# Patient Record
Sex: Female | Born: 1945
Health system: Southern US, Community
[De-identification: ages and names within clinical notes are randomized; demographics above are authoritative.]

## PROBLEM LIST (undated history)

## (undated) DIAGNOSIS — I10 Essential (primary) hypertension: Secondary | ICD-10-CM

## (undated) DIAGNOSIS — G629 Polyneuropathy, unspecified: Secondary | ICD-10-CM

## (undated) DIAGNOSIS — M1990 Unspecified osteoarthritis, unspecified site: Secondary | ICD-10-CM

## (undated) DIAGNOSIS — E785 Hyperlipidemia, unspecified: Secondary | ICD-10-CM

## (undated) HISTORY — DX: Polyneuropathy, unspecified: G62.9

## (undated) HISTORY — PX: ABDOMINAL HYSTERECTOMY: SHX81

## (undated) HISTORY — DX: Unspecified osteoarthritis, unspecified site: M19.90

## (undated) HISTORY — DX: Hyperlipidemia, unspecified: E78.5

---

## 1996-09-08 HISTORY — PX: KNEE ARTHROSCOPY: SUR90

## 2001-07-27 ENCOUNTER — Ambulatory Visit (HOSPITAL_COMMUNITY): Admission: RE | Admit: 2001-07-27 | Discharge: 2001-07-27 | Payer: Self-pay | Admitting: Family Medicine

## 2001-07-27 ENCOUNTER — Encounter: Payer: Self-pay | Admitting: Family Medicine

## 2001-10-21 ENCOUNTER — Ambulatory Visit (HOSPITAL_COMMUNITY): Admission: RE | Admit: 2001-10-21 | Discharge: 2001-10-21 | Payer: Self-pay | Admitting: Orthopaedic Surgery

## 2001-10-21 ENCOUNTER — Encounter: Payer: Self-pay | Admitting: Orthopaedic Surgery

## 2002-04-09 ENCOUNTER — Emergency Department (HOSPITAL_COMMUNITY): Admission: EM | Admit: 2002-04-09 | Discharge: 2002-04-09 | Payer: Self-pay | Admitting: *Deleted

## 2004-01-16 ENCOUNTER — Ambulatory Visit (HOSPITAL_COMMUNITY): Admission: RE | Admit: 2004-01-16 | Discharge: 2004-01-16 | Payer: Self-pay | Admitting: Family Medicine

## 2004-02-19 ENCOUNTER — Ambulatory Visit (HOSPITAL_COMMUNITY): Admission: RE | Admit: 2004-02-19 | Discharge: 2004-02-19 | Payer: Self-pay | Admitting: Internal Medicine

## 2004-06-20 ENCOUNTER — Ambulatory Visit (HOSPITAL_COMMUNITY): Admission: RE | Admit: 2004-06-20 | Discharge: 2004-06-20 | Payer: Self-pay | Admitting: Family Medicine

## 2005-02-23 ENCOUNTER — Inpatient Hospital Stay (HOSPITAL_COMMUNITY): Admission: EM | Admit: 2005-02-23 | Discharge: 2005-02-28 | Payer: Self-pay | Admitting: *Deleted

## 2011-05-16 ENCOUNTER — Other Ambulatory Visit (HOSPITAL_COMMUNITY): Payer: Self-pay | Admitting: Family Medicine

## 2011-05-16 DIAGNOSIS — Z139 Encounter for screening, unspecified: Secondary | ICD-10-CM

## 2011-05-27 ENCOUNTER — Ambulatory Visit (HOSPITAL_COMMUNITY): Payer: Self-pay

## 2011-06-05 ENCOUNTER — Ambulatory Visit (HOSPITAL_COMMUNITY)
Admission: RE | Admit: 2011-06-05 | Discharge: 2011-06-05 | Disposition: A | Discharge status: Home / Self Care | Payer: Medicare Other | Source: Ambulatory Visit | Attending: Family Medicine | Admitting: Family Medicine

## 2011-06-05 DIAGNOSIS — Z1231 Encounter for screening mammogram for malignant neoplasm of breast: Secondary | ICD-10-CM | POA: Insufficient documentation

## 2011-06-05 DIAGNOSIS — Z139 Encounter for screening, unspecified: Secondary | ICD-10-CM

## 2011-08-02 ENCOUNTER — Emergency Department (HOSPITAL_COMMUNITY)
Admission: EM | Admit: 2011-08-02 | Discharge: 2011-08-02 | Disposition: A | Discharge status: Home / Self Care | Payer: Medicare Other | Attending: Emergency Medicine | Admitting: Emergency Medicine

## 2011-08-02 ENCOUNTER — Encounter: Payer: Self-pay | Admitting: *Deleted

## 2011-08-02 DIAGNOSIS — E119 Type 2 diabetes mellitus without complications: Secondary | ICD-10-CM | POA: Insufficient documentation

## 2011-08-02 DIAGNOSIS — X58XXXA Exposure to other specified factors, initial encounter: Secondary | ICD-10-CM | POA: Insufficient documentation

## 2011-08-02 DIAGNOSIS — I10 Essential (primary) hypertension: Secondary | ICD-10-CM | POA: Insufficient documentation

## 2011-08-02 DIAGNOSIS — T783XXA Angioneurotic edema, initial encounter: Secondary | ICD-10-CM | POA: Insufficient documentation

## 2011-08-02 DIAGNOSIS — Z87891 Personal history of nicotine dependence: Secondary | ICD-10-CM | POA: Insufficient documentation

## 2011-08-02 HISTORY — DX: Essential (primary) hypertension: I10

## 2011-08-02 MED ORDER — DILTIAZEM HCL ER COATED BEADS 120 MG PO CP24: 120.0000 mg | ORAL_CAPSULE | Freq: Every day | ORAL | Status: DC

## 2011-08-02 MED ORDER — DIPHENHYDRAMINE HCL 50 MG/ML IJ SOLN
25.0000 mg | Freq: Once | INTRAMUSCULAR | Status: DC
  Filled 2011-08-02: qty 1

## 2011-08-02 MED ORDER — PREDNISONE 20 MG PO TABS
60.0000 mg | ORAL_TABLET | Freq: Once | ORAL | Status: AC
  Administered 2011-08-02: 60 mg via ORAL
  Filled 2011-08-02: qty 3

## 2011-08-02 MED ORDER — DIPHENHYDRAMINE HCL 25 MG PO CAPS
25.0000 mg | ORAL_CAPSULE | Freq: Once | ORAL | Status: AC
  Administered 2011-08-02: 25 mg via ORAL
  Filled 2011-08-02: qty 1

## 2011-08-02 MED ORDER — FAMOTIDINE 20 MG PO TABS
20.0000 mg | ORAL_TABLET | Freq: Once | ORAL | Status: AC
  Administered 2011-08-02: 20 mg via ORAL
  Filled 2011-08-02: qty 1

## 2011-08-02 NOTE — ED Provider Notes (Signed)
History     CSN: [id number] Arrival date & time: 08/02/2011  7:16 PM   First MD Initiated Contact with Patient 08/02/11 1922      Chief Complaint  Patient presents with  . Oral Swelling    (Consider location/radiation/quality/duration/timing/severity/associated sxs/prior treatment) HPI Comments: Sue Wood is a 65 y.o. Female with a h/o hypertension and diabetes who presents to the Emergency Department complaining of lip selling that began this morning after eating and bagel and having green tea. She states that within 15 minutes of eating the bagel she felt her left upper lip begin to swell. The swelling mae it feel tingling. The swelling subsided on its own over a couple of hours and then she developed marked swelling of her lower lip, which is still present. She states the swelling makes her lip feel tight and painful. She denies itching to her throat, tongue swelling, difficulty taking or swallowing. She states that both of her sisters have had similar symptoms in the past and were taken off their antihypertensive medicines. The patient is on lisinopril. PCP is [employer].   Past Medical History  Diagnosis Date  . Hypertension   . Diabetes mellitus     History reviewed. No pertinent past surgical history.  History reviewed. No pertinent family history.  History  Substance Use Topics  . Smoking status: Former Games developer  . Smokeless tobacco: Not on file  . Alcohol Use: No    OB History    Grav Para Term Preterm Abortions TAB SAB Ect Mult Living                  Review of Systems 10 Systems reviewed and are negative for acute change except as noted in the HPI. Allergies  Review of patient's allergies indicates no known allergies.  Home Medications  No current outpatient prescriptions on file.  BP 145/69  Pulse 86  Temp(Src) 98.7 F (37.1 C) (Oral)  Resp 20  Ht 5\' 6"  (1.676 m)  Wt 218 lb (98.884 kg)  BMI 35.19 kg/m2  SpO2 100%  Physical Exam   Nursing note and vitals reviewed. Constitutional: She is oriented to person, place, and time. She appears well-developed and well-nourished. No distress.  HENT:  Head: Normocephalic.       Marked swelling to lower lip, no tongue swelling, uvula midline, no airway compromise, oropharynx clear and moist.  Neck: Normal range of motion.       No stridor  Cardiovascular: Normal rate, normal heart sounds and intact distal pulses.   Pulmonary/Chest: Effort normal and breath sounds normal.  Abdominal: Soft. Bowel sounds are normal.  Musculoskeletal: Normal range of motion.  Neurological: She is alert and oriented to person, place, and time. She has normal reflexes.    ED Course  Procedures (including critical care time)  Labs Reviewed - No data to display No results found.   No diagnosis found.    MDM  Patient with angioedema to lower lip that began today. Has been on lisinopril since 2006. Family h/o angioedema in two siblings who were on ACE inhibitors. Given prednisone,pepcid, bendadryl. Stopped lisinopril. Began an alternate antihypertensive. She will follow up with her PCP. Pt stable in ED with no significant deterioration in condition.The patient appears reasonably screened and/or stabilized for discharge and I doubt any other medical condition or other [facility] requiring further screening, evaluation, or treatment in the ED at this time prior to discharge.  MDM Reviewed: nursing note and vitals  Nicoletta Dress. Colon Branch, MD 08/03/11 [phone]

## 2011-08-02 NOTE — ED Notes (Signed)
Noted upper lip swelling yesterday after eating a bagel and green tea, upper lip with decreased swelling but bottom lip very swollen now

## 2011-08-02 NOTE — ED Notes (Signed)
Attempted to start IV x 4 by 2 RNs unsuccessful.

## 2011-08-02 NOTE — ED Notes (Signed)
Breathing WNL; Lips and gums swollen started yesterday at 1600

## 2011-08-02 NOTE — ED Notes (Signed)
Pt ambulatory and stable at discharge; no resp. Distress; no c/o's

## 2011-08-19 ENCOUNTER — Other Ambulatory Visit (HOSPITAL_COMMUNITY): Payer: Self-pay | Admitting: Family Medicine

## 2011-08-19 DIAGNOSIS — M25519 Pain in unspecified shoulder: Secondary | ICD-10-CM

## 2011-09-04 ENCOUNTER — Ambulatory Visit (HOSPITAL_COMMUNITY)
Admission: RE | Admit: 2011-09-04 | Discharge: 2011-09-04 | Disposition: A | Discharge status: Home / Self Care | Payer: Medicare Other | Source: Ambulatory Visit | Attending: Family Medicine | Admitting: Family Medicine

## 2011-09-04 DIAGNOSIS — M67919 Unspecified disorder of synovium and tendon, unspecified shoulder: Secondary | ICD-10-CM | POA: Insufficient documentation

## 2011-09-04 DIAGNOSIS — M25519 Pain in unspecified shoulder: Secondary | ICD-10-CM | POA: Insufficient documentation

## 2011-09-04 DIAGNOSIS — M719 Bursopathy, unspecified: Secondary | ICD-10-CM | POA: Insufficient documentation

## 2011-09-04 DIAGNOSIS — M19019 Primary osteoarthritis, unspecified shoulder: Secondary | ICD-10-CM | POA: Insufficient documentation

## 2011-12-31 ENCOUNTER — Encounter: Payer: Self-pay | Admitting: Family Medicine

## 2011-12-31 ENCOUNTER — Ambulatory Visit (INDEPENDENT_AMBULATORY_CARE_PROVIDER_SITE_OTHER): Payer: Medicare Other | Admitting: Family Medicine

## 2011-12-31 VITALS — BP 126/78 | HR 87 | Resp 18 | Ht 68.0 in | Wt 217.0 lb

## 2011-12-31 DIAGNOSIS — M21612 Bunion of left foot: Secondary | ICD-10-CM | POA: Insufficient documentation

## 2011-12-31 DIAGNOSIS — E669 Obesity, unspecified: Secondary | ICD-10-CM

## 2011-12-31 DIAGNOSIS — M21619 Bunion of unspecified foot: Secondary | ICD-10-CM

## 2011-12-31 DIAGNOSIS — E1142 Type 2 diabetes mellitus with diabetic polyneuropathy: Secondary | ICD-10-CM

## 2011-12-31 DIAGNOSIS — I10 Essential (primary) hypertension: Secondary | ICD-10-CM

## 2011-12-31 DIAGNOSIS — E785 Hyperlipidemia, unspecified: Secondary | ICD-10-CM

## 2011-12-31 DIAGNOSIS — M21611 Bunion of right foot: Secondary | ICD-10-CM | POA: Insufficient documentation

## 2011-12-31 DIAGNOSIS — E1140 Type 2 diabetes mellitus with diabetic neuropathy, unspecified: Secondary | ICD-10-CM

## 2011-12-31 DIAGNOSIS — IMO0001 Reserved for inherently not codable concepts without codable children: Secondary | ICD-10-CM

## 2011-12-31 DIAGNOSIS — E1149 Type 2 diabetes mellitus with other diabetic neurological complication: Secondary | ICD-10-CM

## 2011-12-31 DIAGNOSIS — E1165 Type 2 diabetes mellitus with hyperglycemia: Secondary | ICD-10-CM

## 2011-12-31 DIAGNOSIS — IMO0002 Reserved for concepts with insufficient information to code with codable children: Secondary | ICD-10-CM

## 2011-12-31 LAB — HEMOGLOBIN A1C
Hgb A1c MFr Bld: 10.7 % — ABNORMAL HIGH (ref <5.7)
Mean Plasma Glucose: 260 mg/dL — ABNORMAL HIGH (ref <117)

## 2011-12-31 LAB — COMPREHENSIVE METABOLIC PANEL
ALT: 18 U/L (ref 0–35)
Albumin: 4.6 g/dL (ref 3.5–5.2)
CO2: 27 mEq/L (ref 19–32)
Chloride: 102 mEq/L (ref 96–112)
Total Protein: 7.9 g/dL (ref 6.0–8.3)

## 2011-12-31 LAB — LIPID PANEL
HDL: 66 mg/dL (ref >39)
Total CHOL/HDL Ratio: 2.7 Ratio
Triglycerides: 72 mg/dL (ref <150)

## 2011-12-31 LAB — CBC
Hemoglobin: 12 g/dL (ref 12.0–15.0)
MCH: 29.1 pg (ref 26.0–34.0)
MCHC: 32.3 g/dL (ref 30.0–36.0)
RDW: 14 % (ref 11.5–15.5)
WBC: 4.8 10*3/uL (ref 4.0–10.5)

## 2011-12-31 MED ORDER — AMITRIPTYLINE HCL 50 MG PO TABS: 50.0000 mg | ORAL_TABLET | Freq: Every day | ORAL | Status: DC

## 2011-12-31 MED ORDER — INSULIN GLARGINE 100 UNIT/ML ~~LOC~~ SOLN: 38.0000 [IU] | Freq: Every day | SUBCUTANEOUS | Status: DC

## 2011-12-31 MED ORDER — INSULIN ASPART 100 UNIT/ML ~~LOC~~ SOLN: SUBCUTANEOUS | Status: DC

## 2011-12-31 MED ORDER — EZETIMIBE-SIMVASTATIN 10-40 MG PO TABS: 1.0000 | ORAL_TABLET | Freq: Every day | ORAL | Status: DC

## 2011-12-31 MED ORDER — DILTIAZEM HCL 90 MG PO TABS: 90.0000 mg | ORAL_TABLET | Freq: Four times a day (QID) | ORAL | Status: DC

## 2011-12-31 NOTE — Assessment & Plan Note (Signed)
At goal, pt diabetic but does not tolerate ACEI

## 2011-12-31 NOTE — Progress Notes (Signed)
  Subjective:    Patient ID: Sue Wood, female    DOB: [birth year], 66 y.o.   MRN: [id number]  HPI Pt here to establish care, previous PCP [facility] practice Ortho-Dr. Hilda Lias Medications and history reviewed DM- fasting blood sugars have been elevated 200-300 since she had a steroid shot in her shoulder for bursitis, due for labs, taking Lantus 38units and SSI, +peripheral neuropathy, no hypoglycemia Joint pain- followed by ortho, s/p arthroscopy for knee. Colonoscopy 2003 Mammogram UTD    Review of Systems  GEN- denies fatigue, fever, weight loss,weakness, recent illness HEENT- denies eye drainage, change in vision, nasal discharge, CVS- denies chest pain, palpitations RESP- denies SOB, cough, wheeze ABD- denies N/V, change in stools, abd pain GU- denies dysuria, hematuria, dribbling, incontinence MSK- + joint pain, muscle aches, injury Neuro- denies headache, dizziness, syncope, seizure activity       Objective:   Physical Exam GEN- NAD, alert and oriented x3 HEENT- PERRL, EOMI, non injected sclera, pink conjunctiva, MMM, oropharynx clear, dentures, blind left eye, Neck- Supple, no thryomegaly, no bruit CVS- RRR, no murmur RESP-CTAB ABD-NABS,soft, NT,ND EXT- trace pedal edema Pulses- Radial, DP- 2+        Assessment & Plan:

## 2011-12-31 NOTE — Assessment & Plan Note (Signed)
Continue TCA 

## 2011-12-31 NOTE — Assessment & Plan Note (Signed)
Continue Vytorin, check labs

## 2011-12-31 NOTE — Assessment & Plan Note (Signed)
Will hold on podiatry at this time.

## 2011-12-31 NOTE — Assessment & Plan Note (Signed)
Check A1C and titrate insulin

## 2011-12-31 NOTE — Patient Instructions (Signed)
I have refilled your medications We will call and tell you what to change your insulin to. Continue current meds Nice to meet you! F/U 4 weeks

## 2012-01-12 ENCOUNTER — Telehealth: Payer: Self-pay | Admitting: Family Medicine

## 2012-01-12 NOTE — Telephone Encounter (Signed)
BOTH insulins that she is on have already been refilled

## 2012-01-15 ENCOUNTER — Telehealth: Payer: Self-pay | Admitting: Family Medicine

## 2012-01-16 ENCOUNTER — Other Ambulatory Visit: Payer: Self-pay

## 2012-01-16 MED ORDER — INSULIN GLARGINE 100 UNIT/ML ~~LOC~~ SOLN: 38.0000 [IU] | Freq: Every day | SUBCUTANEOUS | Status: DC

## 2012-01-16 MED ORDER — INSULIN ASPART 100 UNIT/ML ~~LOC~~ SOLN: SUBCUTANEOUS | Status: DC

## 2012-01-16 NOTE — Telephone Encounter (Signed)
meds reordered

## 2012-01-28 ENCOUNTER — Ambulatory Visit (INDEPENDENT_AMBULATORY_CARE_PROVIDER_SITE_OTHER): Payer: Medicare Other | Admitting: Family Medicine

## 2012-01-28 ENCOUNTER — Encounter: Payer: Self-pay | Admitting: Family Medicine

## 2012-01-28 VITALS — BP 110/80 | HR 94 | Resp 15 | Ht 68.0 in | Wt 211.0 lb

## 2012-01-28 DIAGNOSIS — IMO0002 Reserved for concepts with insufficient information to code with codable children: Secondary | ICD-10-CM

## 2012-01-28 DIAGNOSIS — E1165 Type 2 diabetes mellitus with hyperglycemia: Secondary | ICD-10-CM

## 2012-01-28 DIAGNOSIS — I10 Essential (primary) hypertension: Secondary | ICD-10-CM

## 2012-01-28 DIAGNOSIS — IMO0001 Reserved for inherently not codable concepts without codable children: Secondary | ICD-10-CM

## 2012-01-28 MED ORDER — METFORMIN HCL 1000 MG PO TABS: 1000.0000 mg | ORAL_TABLET | Freq: Two times a day (BID) | ORAL | Status: DC

## 2012-01-28 NOTE — Patient Instructions (Signed)
For the metformin (diabetes pill), take 1/2 tablet at dinner for 1 week Then increase to 1/2 tablet (breakfast and dinner) twice a day x 1 week Then increase 1/2 tablet w/ breakfast and 1 tablet at dinner x 1 week Then 1 tablet twice a day  Keep the lantus at the same dose Increase your Novolog- to 12 units with each meal  If your blood sugars are still in the 400's in 2 weeks please call  F/U 2 months

## 2012-01-29 ENCOUNTER — Encounter: Payer: Self-pay | Admitting: Family Medicine

## 2012-01-29 NOTE — Assessment & Plan Note (Signed)
She was placed directly on insulin after a hospitilization, I think she would benefit from addition of metformin, will start titration see below She is to increase her meal time coverage to 12 units Continue lantus at 40 units

## 2012-01-29 NOTE — Progress Notes (Signed)
  Subjective:    Patient ID: Sue Wood, female    DOB: 1946-08-21, 66 y.o.   MRN: [id number]  HPI Pt here for intermin visit for DM, seen 4 weeks ago, A1C was elevated at 10.7%, last A1C 7.6% at her previous PCP. She has some difficulty getting her insulin but is now using as directed Fasting CBG at 200's. After meal up to 500.  She felt fatigued when she did not have her insulin but otherwise doing well Denies polyuria, hypoglycemia  Review of Systems    GEN- denies fatigue, fever, weight loss,weakness, recent illness HEENT- denies eye drainage, change in vision, nasal discharge, CVS- denies chest pain, palpitations RESP- denies SOB, cough, wheeze ABD- denies N/V, change in stools, abd pain       Objective:   Physical Exam GEN- NAD, alert and oriented x3 CVS- RRR, no murmur RESP-CTAB EXT- trace pedal edema Pulses- Radial, DP- 2+       Assessment & Plan:

## 2012-01-29 NOTE — Assessment & Plan Note (Signed)
Well controlled 

## 2012-06-10 ENCOUNTER — Other Ambulatory Visit: Payer: Self-pay | Admitting: Family Medicine

## 2012-07-09 ENCOUNTER — Other Ambulatory Visit: Payer: Self-pay | Admitting: Family Medicine

## 2012-08-20 ENCOUNTER — Encounter: Payer: Self-pay | Admitting: Family Medicine

## 2012-08-20 ENCOUNTER — Ambulatory Visit (INDEPENDENT_AMBULATORY_CARE_PROVIDER_SITE_OTHER): Payer: Medicare Other | Admitting: Family Medicine

## 2012-08-20 VITALS — BP 120/82 | HR 97 | Resp 16 | Ht 68.0 in | Wt 214.0 lb

## 2012-08-20 DIAGNOSIS — IMO0001 Reserved for inherently not codable concepts without codable children: Secondary | ICD-10-CM

## 2012-08-20 DIAGNOSIS — IMO0002 Reserved for concepts with insufficient information to code with codable children: Secondary | ICD-10-CM

## 2012-08-20 DIAGNOSIS — E1140 Type 2 diabetes mellitus with diabetic neuropathy, unspecified: Secondary | ICD-10-CM

## 2012-08-20 DIAGNOSIS — E119 Type 2 diabetes mellitus without complications: Secondary | ICD-10-CM

## 2012-08-20 DIAGNOSIS — E1142 Type 2 diabetes mellitus with diabetic polyneuropathy: Secondary | ICD-10-CM

## 2012-08-20 DIAGNOSIS — E1149 Type 2 diabetes mellitus with other diabetic neurological complication: Secondary | ICD-10-CM

## 2012-08-20 DIAGNOSIS — I10 Essential (primary) hypertension: Secondary | ICD-10-CM

## 2012-08-20 DIAGNOSIS — E669 Obesity, unspecified: Secondary | ICD-10-CM

## 2012-08-20 DIAGNOSIS — E785 Hyperlipidemia, unspecified: Secondary | ICD-10-CM

## 2012-08-20 DIAGNOSIS — E1165 Type 2 diabetes mellitus with hyperglycemia: Secondary | ICD-10-CM

## 2012-08-20 MED ORDER — EZETIMIBE-SIMVASTATIN 10-40 MG PO TABS: 1.0000 | ORAL_TABLET | Freq: Every day | ORAL | Status: DC

## 2012-08-20 NOTE — Patient Instructions (Addendum)
Take Lantus 45 units at bedtime Novolog 10 units with each meal Metformin 1 tablet twice a day Blood pressure pill 1/2 tablet twice a day  Get your blood drawn today [facility] referral  F/U 4 weeks bring meter and medicines

## 2012-08-21 LAB — BASIC METABOLIC PANEL
BUN: 19 mg/dL (ref 6–23)
Chloride: 100 mEq/L (ref 96–112)
Glucose, Bld: 292 mg/dL — ABNORMAL HIGH (ref 70–99)

## 2012-08-22 ENCOUNTER — Encounter: Payer: Self-pay | Admitting: Family Medicine

## 2012-08-22 NOTE — Assessment & Plan Note (Signed)
Unfortunately she has been lost to follow-up due to transportation issues, I will have [facility] work with her Lantus 45 units  Continue Novolog 10 units with each meal Insulin to be adjusted once A1C seen Advised 1 tab metformin twice a day On statin, allergy ACEI

## 2012-08-22 NOTE — Assessment & Plan Note (Signed)
On statin drug, last check April at goal

## 2012-08-22 NOTE — Progress Notes (Signed)
  Subjective:    Patient ID: Sue Wood, female    DOB: 05-05-1946, 66 y.o.   MRN: [id number]  HPI  Pt presents to f/u chronic medical problems, she has not been seen since May 2013, she states she has not been able to get a ride to office. Has been taking 1/2 tablet Metformin tiwce a day, using Lantus 45 units and novolov 10 units with some meals but not all. Describes if sugar is in 100's fasting she will not take at breakfast?? No hypoglycemia She has only been taking 1 of the  90mg  cardizem tablet daily  No flu shot Out of novolog for 1 week now  Review of Systems - per above  GEN- denies fatigue, fever, weight loss,weakness, recent illness HEENT- denies eye drainage, change in vision, nasal discharge, CVS- denies chest pain, palpitations RESP- denies SOB, cough, wheeze ABD- denies N/V, change in stools, abd pain GU- denies dysuria, hematuria, dribbling, incontinence MSK- denies joint pain, muscle aches, injury Neuro- denies headache, dizziness, syncope, seizure activity      Objective:   Physical Exam  GEN- NAD, alert and oriented x3 HEENT- PERRL, EOMI, non injected sclera, pink conjunctiva, MMM, oropharynx clear Neck- Supple,  CVS- RRR, no murmur RESP-CTAB ABD-NABS,soft,NT,ND EXT- trace pedal edema Pulses- Radial, DP- 2+          Assessment & Plan:

## 2012-08-22 NOTE — Assessment & Plan Note (Signed)
Doing well on elavil.

## 2012-08-22 NOTE — Assessment & Plan Note (Signed)
Unchanged, discusse diet avoidance of sugary snacks, drinks,

## 2012-08-22 NOTE — Assessment & Plan Note (Signed)
Her BP looks okay but she is taking short acting cardizem, we will try 1/2 tablet BID to make sure she is covered in the evening , may need to change this all together at next visit

## 2012-10-07 ENCOUNTER — Encounter: Payer: Self-pay | Admitting: Family Medicine

## 2012-10-07 ENCOUNTER — Ambulatory Visit: Payer: Medicare Other | Admitting: Family Medicine

## 2012-10-12 ENCOUNTER — Ambulatory Visit (INDEPENDENT_AMBULATORY_CARE_PROVIDER_SITE_OTHER): Payer: Medicare Other | Admitting: Family Medicine

## 2012-10-12 ENCOUNTER — Encounter: Payer: Self-pay | Admitting: Family Medicine

## 2012-10-12 VITALS — BP 128/80 | HR 78 | Resp 16 | Ht 68.0 in | Wt 217.0 lb

## 2012-10-12 DIAGNOSIS — IMO0001 Reserved for inherently not codable concepts without codable children: Secondary | ICD-10-CM

## 2012-10-12 DIAGNOSIS — E1165 Type 2 diabetes mellitus with hyperglycemia: Secondary | ICD-10-CM

## 2012-10-12 DIAGNOSIS — E1140 Type 2 diabetes mellitus with diabetic neuropathy, unspecified: Secondary | ICD-10-CM

## 2012-10-12 DIAGNOSIS — I10 Essential (primary) hypertension: Secondary | ICD-10-CM

## 2012-10-12 DIAGNOSIS — E785 Hyperlipidemia, unspecified: Secondary | ICD-10-CM

## 2012-10-12 DIAGNOSIS — IMO0002 Reserved for concepts with insufficient information to code with codable children: Secondary | ICD-10-CM

## 2012-10-12 DIAGNOSIS — E1149 Type 2 diabetes mellitus with other diabetic neurological complication: Secondary | ICD-10-CM

## 2012-10-12 DIAGNOSIS — E1142 Type 2 diabetes mellitus with diabetic polyneuropathy: Secondary | ICD-10-CM

## 2012-10-12 MED ORDER — AMITRIPTYLINE HCL 50 MG PO TABS: 50.0000 mg | ORAL_TABLET | Freq: Every day | ORAL | Status: DC

## 2012-10-12 MED ORDER — INSULIN GLARGINE 100 UNIT/ML ~~LOC~~ SOLN: 45.0000 [IU] | Freq: Every day | SUBCUTANEOUS | Status: DC

## 2012-10-12 MED ORDER — AMLODIPINE BESYLATE 10 MG PO TABS: 10.0000 mg | ORAL_TABLET | Freq: Every day | ORAL | Status: DC

## 2012-10-12 MED ORDER — ATORVASTATIN CALCIUM 20 MG PO TABS: 20.0000 mg | ORAL_TABLET | Freq: Every day | ORAL | Status: DC

## 2012-10-12 MED ORDER — METFORMIN HCL 1000 MG PO TABS: 1000.0000 mg | ORAL_TABLET | Freq: Two times a day (BID) | ORAL | Status: DC

## 2012-10-12 MED ORDER — INSULIN ASPART 100 UNIT/ML ~~LOC~~ SOLN: SUBCUTANEOUS | Status: DC

## 2012-10-12 NOTE — Patient Instructions (Signed)
For diabetes- Take Novolog 15 units with each meal, metformin 1 tablet twice with meals ( Breakfast and dinner), Lantus 45 units For blood pressure, start new medication norvasc (amlodipine) once a day, stop the cardizem Start baby aspirin 81mg  daily  Medicine for nerve pain in feet sent in  New prescription for Lipitor for cholesterol, take all of the vytorin first  Call about the transportation A1C is 12%, goal is 7% F/U 2 months ( for diabetes- Do not eat morning)

## 2012-10-12 NOTE — Progress Notes (Signed)
  Subjective:    Patient ID: Sue Wood, female    DOB: [birth year], 67 y.o.   MRN: [id number]  HPI Patient here to followup diabetes mellitus this is an interim visit as she was recently restarted on her medications. Her A1c was 12% she is now on NovoLog 15 units with each meal Lantus 45 units and metformin I reviewed her bottle she still has metformin from December which she's been taking so I do not think she's been taking this twice a day. Her fasting blood sugar this morning was 147 her meter shows a couple days in February and January and then goes back to July. Her 14 day average is 147. She denies any hypoglycemia   Review of Systems - per above    GEN- denies fatigue, fever, weight loss,weakness, recent illness HEENT- denies eye drainage, change in vision, nasal discharge, CVS- denies chest pain, palpitations RESP- denies SOB, cough, wheeze       Objective:   Physical Exam  GEN- NAD, alert and oriented x3       Assessment & Plan:

## 2012-10-13 NOTE — Assessment & Plan Note (Signed)
Change to norvasc, d/cu cardizem

## 2012-10-13 NOTE — Assessment & Plan Note (Addendum)
Uncontrolled state, allergy to ACEI Aspirin daily Novolog 15 units, Lantus 45, restart metformin 1000mg  BID If she does not improve will need endocrine

## 2012-10-13 NOTE — Assessment & Plan Note (Signed)
vytorin not covered, change to lipitor due to CCB

## 2012-10-13 NOTE — Assessment & Plan Note (Signed)
Continue elavil, refilled

## 2012-12-16 ENCOUNTER — Ambulatory Visit (INDEPENDENT_AMBULATORY_CARE_PROVIDER_SITE_OTHER): Payer: Medicare Other | Admitting: Family Medicine

## 2012-12-16 ENCOUNTER — Encounter: Payer: Self-pay | Admitting: Family Medicine

## 2012-12-16 VITALS — BP 118/80 | HR 90 | Resp 16 | Wt 221.8 lb

## 2012-12-16 DIAGNOSIS — M179 Osteoarthritis of knee, unspecified: Secondary | ICD-10-CM

## 2012-12-16 DIAGNOSIS — I10 Essential (primary) hypertension: Secondary | ICD-10-CM

## 2012-12-16 DIAGNOSIS — E1165 Type 2 diabetes mellitus with hyperglycemia: Secondary | ICD-10-CM

## 2012-12-16 DIAGNOSIS — IMO0001 Reserved for inherently not codable concepts without codable children: Secondary | ICD-10-CM

## 2012-12-16 DIAGNOSIS — M1710 Unilateral primary osteoarthritis, unspecified knee: Secondary | ICD-10-CM | POA: Insufficient documentation

## 2012-12-16 DIAGNOSIS — E785 Hyperlipidemia, unspecified: Secondary | ICD-10-CM

## 2012-12-16 DIAGNOSIS — IMO0002 Reserved for concepts with insufficient information to code with codable children: Secondary | ICD-10-CM

## 2012-12-16 LAB — COMPREHENSIVE METABOLIC PANEL
Albumin: 4 g/dL (ref 3.5–5.2)
Alkaline Phosphatase: 60 U/L (ref 39–117)
BUN: 11 mg/dL (ref 6–23)
CO2: 28 mEq/L (ref 19–32)
Creat: 0.87 mg/dL (ref 0.50–1.10)
Glucose, Bld: 101 mg/dL — ABNORMAL HIGH (ref 70–99)
Total Protein: 7.8 g/dL (ref 6.0–8.3)

## 2012-12-16 LAB — CBC
Hemoglobin: 12.2 g/dL (ref 12.0–15.0)
Platelets: 305 10*3/uL (ref 150–400)
RBC: 4.23 MIL/uL (ref 3.87–5.11)

## 2012-12-16 LAB — LIPID PANEL
LDL Cholesterol: 86 mg/dL (ref 0–99)
Triglycerides: 89 mg/dL (ref <150)
VLDL: 18 mg/dL (ref 0–40)

## 2012-12-16 LAB — HEMOGLOBIN A1C: Hgb A1c MFr Bld: 7.2 % — ABNORMAL HIGH (ref <5.7)

## 2012-12-16 NOTE — Assessment & Plan Note (Signed)
Known OA of knees, this can flare up with long periods of walking, would benefit from transportation as no buses in [place] area, pt needs to get to appt unable to walk the distance as [place]

## 2012-12-16 NOTE — Assessment & Plan Note (Signed)
Check FLP 

## 2012-12-16 NOTE — Assessment & Plan Note (Signed)
BP well controlled on norvasc

## 2012-12-16 NOTE — Patient Instructions (Signed)
Get the labs done fasting Continue your current insulin dose Pneumonia shot given F/U 3 months

## 2012-12-16 NOTE — Assessment & Plan Note (Signed)
Blood sugars per report are improved, will check A1C gain today, tolerating metformin, on ASA

## 2012-12-16 NOTE — Progress Notes (Signed)
  Subjective:    Patient ID: Sue Wood, female    DOB: September 25, 1945, 67 y.o.   MRN: [id number]  HPI  Pt here to f/u diabetes mellitus and blood pressure, last A1C 12%, she did not bring meter with her, fastings 90-100's , before meals 170's,. Taking novolog 15 units with lunch and dinner, because she states it is low before breakfast, no hypoglycemia, no polyuria, poly dipsia Has transit form for me to complete for transportation    Review of Systems   GEN- denies fatigue, fever, weight loss,weakness, recent illness HEENT- denies eye drainage, change in vision, nasal discharge, CVS- denies chest pain, palpitations RESP- denies SOB, cough, wheeze Neuro- denies headache, dizziness, syncope, seizure activity      Objective:   Physical Exam GEN- NAD, alert and oriented x3 CVS- RRR, no murmur RESP-CTAB EXT- No edema Pulses- Radial, DP- 2+        Assessment & Plan:

## 2013-01-04 ENCOUNTER — Encounter: Payer: Self-pay | Admitting: *Deleted

## 2013-02-01 ENCOUNTER — Encounter: Payer: Self-pay | Admitting: Family Medicine

## 2013-03-10 ENCOUNTER — Telehealth: Payer: Self-pay | Admitting: Family Medicine

## 2013-03-10 MED ORDER — AMLODIPINE BESYLATE 10 MG PO TABS: 10.0000 mg | ORAL_TABLET | Freq: Every day | ORAL | Status: DC

## 2013-03-10 MED ORDER — ATORVASTATIN CALCIUM 20 MG PO TABS: 20.0000 mg | ORAL_TABLET | Freq: Every day | ORAL | Status: DC

## 2013-03-10 MED ORDER — AMITRIPTYLINE HCL 50 MG PO TABS: 50.0000 mg | ORAL_TABLET | Freq: Every day | ORAL | Status: DC

## 2013-03-10 NOTE — Telephone Encounter (Signed)
meds refilled 

## 2013-03-16 ENCOUNTER — Ambulatory Visit: Payer: Self-pay | Admitting: Family Medicine

## 2013-03-17 ENCOUNTER — Ambulatory Visit: Payer: Medicare Other | Admitting: Family Medicine

## 2013-03-23 ENCOUNTER — Ambulatory Visit (INDEPENDENT_AMBULATORY_CARE_PROVIDER_SITE_OTHER): Payer: Medicare Other | Admitting: Family Medicine

## 2013-03-23 VITALS — BP 120/60 | HR 78 | Temp 98.9°F | Resp 18 | Wt 223.0 lb

## 2013-03-23 DIAGNOSIS — IMO0001 Reserved for inherently not codable concepts without codable children: Secondary | ICD-10-CM

## 2013-03-23 DIAGNOSIS — E1149 Type 2 diabetes mellitus with other diabetic neurological complication: Secondary | ICD-10-CM

## 2013-03-23 DIAGNOSIS — E1165 Type 2 diabetes mellitus with hyperglycemia: Secondary | ICD-10-CM

## 2013-03-23 DIAGNOSIS — E1142 Type 2 diabetes mellitus with diabetic polyneuropathy: Secondary | ICD-10-CM

## 2013-03-23 DIAGNOSIS — E1140 Type 2 diabetes mellitus with diabetic neuropathy, unspecified: Secondary | ICD-10-CM

## 2013-03-23 DIAGNOSIS — IMO0002 Reserved for concepts with insufficient information to code with codable children: Secondary | ICD-10-CM

## 2013-03-23 DIAGNOSIS — E669 Obesity, unspecified: Secondary | ICD-10-CM

## 2013-03-23 DIAGNOSIS — I10 Essential (primary) hypertension: Secondary | ICD-10-CM

## 2013-03-23 NOTE — Patient Instructions (Addendum)
Increase lantus to 48 units at bedtime  Novolog 15 units at lunch, and 18 at dinner  Continue all other medications F/u 3 months

## 2013-03-24 ENCOUNTER — Encounter: Payer: Self-pay | Admitting: Family Medicine

## 2013-03-24 NOTE — Assessment & Plan Note (Signed)
Discussed weight gain, healthy eating habits Short term goals set

## 2013-03-24 NOTE — Assessment & Plan Note (Signed)
Currently stable on elavil

## 2013-03-24 NOTE — Assessment & Plan Note (Signed)
Well controlled 

## 2013-03-24 NOTE — Assessment & Plan Note (Signed)
CBG improved, will increase her mealtime coverage in the evening to 18 units Lantus up to 48 units Goal is to get below 7 She has associated neuropathy Check urine Micro, intolerant of ACE but on CCB, may need to try ARB

## 2013-03-24 NOTE — Progress Notes (Signed)
  Subjective:    Patient ID: Sue Wood, female    DOB: September 11, 1945, 67 y.o.   MRN: [id number]  HPI Pt here to f/u DM and HTN. No specific concerns.  DM- no hypoglycemia, CBG have been 140-190 fasting (meter read), 230-250 after meals Taking Lantus 45 units, takes Novolog with lunch and dinner, often does not eat breakfast Neuropathy in feet is unchanged, elavil helps She has not been exercising and has gained weight   Review of Systems   GEN- denies fatigue, fever, weight loss,weakness, recent illness HEENT- denies eye drainage, change in vision, nasal discharge, CVS- denies chest pain, palpitations RESP- denies SOB, cough, wheeze ABD- denies N/V, change in stools, abd pain GU- denies dysuria, hematuria, dribbling, incontinence MSK- + joint pain, muscle aches, injury Neuro- denies headache, dizziness, syncope, seizure activity      Objective:   Physical Exam GEN- NAD, alert and oriented x3 HEENT- PERRL, EOMI, non injected sclera, pink conjunctiva, MMM, oropharynx clear Neck- Supple, no bruit CVS- RRR, no murmur RESP-CTAB EXT- No edema Pulses- Radial, DP- 2+        Assessment & Plan:    Due for Pneumovax next visit Also due for repeat Colonoscopy

## 2013-06-24 ENCOUNTER — Ambulatory Visit (INDEPENDENT_AMBULATORY_CARE_PROVIDER_SITE_OTHER): Payer: Medicare Other | Admitting: Family Medicine

## 2013-06-24 VITALS — BP 128/72 | HR 99 | Temp 99.4°F | Resp 20 | Wt 225.0 lb

## 2013-06-24 DIAGNOSIS — Z23 Encounter for immunization: Secondary | ICD-10-CM

## 2013-06-24 DIAGNOSIS — E785 Hyperlipidemia, unspecified: Secondary | ICD-10-CM

## 2013-06-24 DIAGNOSIS — I10 Essential (primary) hypertension: Secondary | ICD-10-CM

## 2013-06-24 DIAGNOSIS — IMO0001 Reserved for inherently not codable concepts without codable children: Secondary | ICD-10-CM

## 2013-06-24 DIAGNOSIS — IMO0002 Reserved for concepts with insufficient information to code with codable children: Secondary | ICD-10-CM

## 2013-06-24 DIAGNOSIS — E1165 Type 2 diabetes mellitus with hyperglycemia: Secondary | ICD-10-CM

## 2013-06-24 DIAGNOSIS — Z1239 Encounter for other screening for malignant neoplasm of breast: Secondary | ICD-10-CM

## 2013-06-24 DIAGNOSIS — Z1211 Encounter for screening for malignant neoplasm of colon: Secondary | ICD-10-CM

## 2013-06-24 LAB — BASIC METABOLIC PANEL
CO2: 26 mEq/L (ref 19–32)
Chloride: 103 mEq/L (ref 96–112)
Potassium: 4.5 mEq/L (ref 3.5–5.3)

## 2013-06-24 LAB — HEMOGLOBIN A1C
Hgb A1c MFr Bld: 8.8 % — ABNORMAL HIGH (ref <5.7)
Mean Plasma Glucose: 206 mg/dL — ABNORMAL HIGH (ref <117)

## 2013-06-24 MED ORDER — AMLODIPINE BESYLATE 10 MG PO TABS: 10.0000 mg | ORAL_TABLET | Freq: Every day | ORAL | Status: DC

## 2013-06-24 MED ORDER — METFORMIN HCL 1000 MG PO TABS: 1000.0000 mg | ORAL_TABLET | Freq: Two times a day (BID) | ORAL | Status: DC

## 2013-06-24 MED ORDER — ATORVASTATIN CALCIUM 20 MG PO TABS: 20.0000 mg | ORAL_TABLET | Freq: Every day | ORAL | Status: DC

## 2013-06-24 MED ORDER — INSULIN ASPART 100 UNIT/ML ~~LOC~~ SOLN: SUBCUTANEOUS | Status: DC

## 2013-06-24 MED ORDER — AMITRIPTYLINE HCL 50 MG PO TABS: 50.0000 mg | ORAL_TABLET | Freq: Every day | ORAL | Status: DC

## 2013-06-24 MED ORDER — INSULIN GLARGINE 100 UNIT/ML ~~LOC~~ SOLN: 48.0000 [IU] | Freq: Every day | SUBCUTANEOUS | Status: DC

## 2013-06-24 NOTE — Patient Instructions (Signed)
Colonoscopy referral  Mammogram to be done Continue current insulin doses We will call with lab results  F/U 3 months for wellness exam

## 2013-06-26 ENCOUNTER — Encounter: Payer: Self-pay | Admitting: Family Medicine

## 2013-06-26 NOTE — Assessment & Plan Note (Signed)
Well controlled 

## 2013-06-26 NOTE — Assessment & Plan Note (Signed)
Check A1C, continue lantus and meal time coverage and metformin ACE inhibitor caused angioedema, will hold on ARB

## 2013-06-26 NOTE — Progress Notes (Signed)
  Subjective:    Patient ID: Sue Wood, female    DOB: 02/13/1946, 67 y.o.   MRN: [id number]  HPI Pt here to f/u DM and HTN. No specific concerns. Needs Flu shot DM- no hypoglycemia, CBG have been 120 fasting (meter read), 180-250 after meals Taking Lantus 48 units, takes Novolog with lunch and dinner ( 18 units), often does not eat breakfast  No hypoglycemia- 30 day average 159 Due for colonoscopy and Mammogram   Review of Systems  GEN- denies fatigue, fever, weight loss,weakness, recent illness HEENT- denies eye drainage, change in vision, nasal discharge, CVS- denies chest pain, palpitations RESP- denies SOB, cough, wheeze ABD- denies N/V, change in stools, abd pain GU- denies dysuria, hematuria, dribbling, incontinence ENDO- no hypoglycemia, no polyuria, no polydipsia MSK- + joint pain, muscle aches, injury Neuro- denies headache, dizziness, syncope, seizure activity      Objective:   Physical Exam GEN- NAD, alert and oriented x3 HEENT- PERRL, EOMI, non injected sclera, pink conjunctiva, MMM, oropharynx clear Neck- Supple, no bruit CVS- RRR, no murmur RESP-CTAB EXT- No edema Pulses- Radial, DP- 2+        Assessment & Plan:

## 2013-06-26 NOTE — Assessment & Plan Note (Signed)
LDL at goal last check on statin drug

## 2013-06-27 ENCOUNTER — Telehealth: Payer: Self-pay | Admitting: Family Medicine

## 2013-06-29 ENCOUNTER — Encounter (INDEPENDENT_AMBULATORY_CARE_PROVIDER_SITE_OTHER): Payer: Self-pay | Admitting: *Deleted

## 2013-07-01 MED ORDER — INSULIN GLARGINE 100 UNIT/ML ~~LOC~~ SOLN: 48.0000 [IU] | Freq: Every day | SUBCUTANEOUS | Status: DC

## 2013-07-01 NOTE — Telephone Encounter (Signed)
Meds refilled.

## 2013-07-01 NOTE — Telephone Encounter (Signed)
Patient needs her Lantus refilled. She went to pick it up on Sunday and they told her that they were un clear on the dosage .     [facility].

## 2013-07-07 ENCOUNTER — Ambulatory Visit (HOSPITAL_COMMUNITY)
Admission: RE | Admit: 2013-07-07 | Discharge: 2013-07-07 | Disposition: A | Discharge status: Home / Self Care | Payer: Medicare Other | Source: Ambulatory Visit | Attending: Family Medicine | Admitting: Family Medicine

## 2013-07-07 DIAGNOSIS — Z1239 Encounter for other screening for malignant neoplasm of breast: Secondary | ICD-10-CM

## 2013-07-07 DIAGNOSIS — Z1231 Encounter for screening mammogram for malignant neoplasm of breast: Secondary | ICD-10-CM | POA: Insufficient documentation

## 2013-08-31 ENCOUNTER — Encounter (HOSPITAL_COMMUNITY): Payer: Self-pay | Admitting: Emergency Medicine

## 2013-08-31 ENCOUNTER — Emergency Department (HOSPITAL_COMMUNITY)
Admission: EM | Admit: 2013-08-31 | Discharge: 2013-08-31 | Disposition: A | Discharge status: Home / Self Care | Payer: Medicare Other | Attending: Emergency Medicine | Admitting: Emergency Medicine

## 2013-08-31 DIAGNOSIS — Z87891 Personal history of nicotine dependence: Secondary | ICD-10-CM | POA: Insufficient documentation

## 2013-08-31 DIAGNOSIS — Y9389 Activity, other specified: Secondary | ICD-10-CM | POA: Insufficient documentation

## 2013-08-31 DIAGNOSIS — S61209A Unspecified open wound of unspecified finger without damage to nail, initial encounter: Secondary | ICD-10-CM | POA: Insufficient documentation

## 2013-08-31 DIAGNOSIS — W268XXA Contact with other sharp object(s), not elsewhere classified, initial encounter: Secondary | ICD-10-CM | POA: Insufficient documentation

## 2013-08-31 DIAGNOSIS — Y929 Unspecified place or not applicable: Secondary | ICD-10-CM | POA: Insufficient documentation

## 2013-08-31 DIAGNOSIS — E785 Hyperlipidemia, unspecified: Secondary | ICD-10-CM | POA: Insufficient documentation

## 2013-08-31 DIAGNOSIS — E119 Type 2 diabetes mellitus without complications: Secondary | ICD-10-CM | POA: Insufficient documentation

## 2013-08-31 DIAGNOSIS — Z794 Long term (current) use of insulin: Secondary | ICD-10-CM | POA: Insufficient documentation

## 2013-08-31 DIAGNOSIS — I10 Essential (primary) hypertension: Secondary | ICD-10-CM | POA: Insufficient documentation

## 2013-08-31 DIAGNOSIS — S61012A Laceration without foreign body of left thumb without damage to nail, initial encounter: Secondary | ICD-10-CM

## 2013-08-31 DIAGNOSIS — Z8669 Personal history of other diseases of the nervous system and sense organs: Secondary | ICD-10-CM | POA: Insufficient documentation

## 2013-08-31 DIAGNOSIS — Z23 Encounter for immunization: Secondary | ICD-10-CM | POA: Insufficient documentation

## 2013-08-31 DIAGNOSIS — Z79899 Other long term (current) drug therapy: Secondary | ICD-10-CM | POA: Insufficient documentation

## 2013-08-31 DIAGNOSIS — S61011A Laceration without foreign body of right thumb without damage to nail, initial encounter: Secondary | ICD-10-CM

## 2013-08-31 DIAGNOSIS — M129 Arthropathy, unspecified: Secondary | ICD-10-CM | POA: Insufficient documentation

## 2013-08-31 DIAGNOSIS — Z7982 Long term (current) use of aspirin: Secondary | ICD-10-CM | POA: Insufficient documentation

## 2013-08-31 MED ORDER — TETANUS-DIPHTH-ACELL PERTUSSIS 5-2.5-18.5 LF-MCG/0.5 IM SUSP
0.5000 mL | Freq: Once | INTRAMUSCULAR | Status: AC
  Administered 2013-08-31: 0.5 mL via INTRAMUSCULAR
  Filled 2013-08-31: qty 0.5

## 2013-08-31 MED ORDER — BACITRACIN-NEOMYCIN-POLYMYXIN 400-5-5000 EX OINT
TOPICAL_OINTMENT | Freq: Once | CUTANEOUS | Status: AC
  Administered 2013-08-31: 1 via TOPICAL
  Filled 2013-08-31: qty 2

## 2013-08-31 MED ORDER — LIDOCAINE HCL (PF) 1 % IJ SOLN
INTRAMUSCULAR | Status: AC
  Filled 2013-08-31: qty 5

## 2013-08-31 MED ORDER — CEPHALEXIN 500 MG PO CAPS: 500.0000 mg | ORAL_CAPSULE | Freq: Two times a day (BID) | ORAL | Status: DC

## 2013-08-31 MED ORDER — LIDOCAINE HCL (PF) 1 % IJ SOLN
5.0000 mL | Freq: Once | INTRAMUSCULAR | Status: AC
  Administered 2013-08-31: 5 mL

## 2013-08-31 MED ORDER — POVIDONE-IODINE 10 % EX SOLN
CUTANEOUS | Status: AC
  Filled 2013-08-31: qty 118

## 2013-08-31 NOTE — ED Provider Notes (Signed)
Medical screening examination/treatment/procedure(s) were conducted as a shared visit with non-physician practitioner(s) and myself.  I personally evaluated the patient during the encounter.  EKG Interpretation   None        Donnetta Hutching, MD 08/31/13 2309

## 2013-08-31 NOTE — ED Provider Notes (Signed)
CSN: [id number]     Arrival date & time 08/31/13  1831 History   First MD Initiated Contact with Patient 08/31/13 1838     Chief Complaint  Patient presents with  . Extremity Laceration   (Consider location/radiation/quality/duration/timing/severity/associated sxs/prior Treatment) Patient is a 67 y.o. female presenting with skin laceration. The history is provided by the patient.  Laceration Location:  Finger Finger laceration location:  L thumb and R thumb Depth:  Through dermis Bleeding: controlled   Time since incident:  30 minutes Laceration mechanism:  Metal edge Pain details:    Severity:  Mild   Progression:  Unchanged Foreign body present:  No foreign bodies Relieved by:  Pressure Worsened by:  Nothing tried Tetanus status:  Unknown  DEMARIS BOUSQUET is a 67 y.o. female who presents to the ED with a laceration that is straight on the palmar aspect of the right thumb and an avulsion laceration to the left lateral thumb near the nail. She opened a can and when she picked it up with her thumbs the inside ridge cut both thumbs. She is concerned because she is a diabetic and doesn't want it to get infected.   Past Medical History  Diagnosis Date  . Hypertension   . Diabetes mellitus   . Arthritis   . Hyperlipidemia   . Peripheral neuropathy    Past Surgical History  Procedure Laterality Date  . Knee arthroscopy  1998    left  . Abdominal hysterectomy     Family History  Problem Relation Age of Onset  . Hypertension Mother   . Hyperlipidemia Mother   . Hypertension Father   . Hyperlipidemia Father   . Diabetes Father    History  Substance Use Topics  . Smoking status: Former Games developer  . Smokeless tobacco: Not on file  . Alcohol Use: No   OB History   Grav Para Term Preterm Abortions TAB SAB Ect Mult Living                 Review of Systems Negative except as stated in HPI  Allergies  Lisinopril  Home Medications   Current Outpatient Rx  Name   Route  Sig  Dispense  Refill  . amitriptyline (ELAVIL) 50 MG tablet   Oral   Take 1 tablet (50 mg total) by mouth at bedtime.   30 tablet   3   . amLODipine (NORVASC) 10 MG tablet   Oral   Take 1 tablet (10 mg total) by mouth daily.   30 tablet   3   . aspirin 81 MG tablet   Oral   Take 81 mg by mouth daily.         Marland Kitchen atorvastatin (LIPITOR) 20 MG tablet   Oral   Take 1 tablet (20 mg total) by mouth daily.   30 tablet   3   . insulin aspart (NOVOLOG) 100 UNIT/ML injection      Sliding scaleInject into the skin 3 (three) times daily before meals. Sliding scale   5 vial   6   . insulin glargine (LANTUS) 100 UNIT/ML injection   Subcutaneous   Inject 0.48 mLs (48 Units total) into the skin daily.   10 mL   11   . metFORMIN (GLUCOPHAGE) 1000 MG tablet   Oral   Take 1 tablet (1,000 mg total) by mouth 2 (two) times daily with a meal.   60 tablet   3    BP 134/74  Pulse 107  Temp(Src) 98.3 F (36.8 C) (Oral)  Resp 20  Ht 5\' 7"  (1.702 m)  Wt 225 lb (102.059 kg)  BMI 35.23 kg/m2  SpO2 100% Physical Exam  Nursing note and vitals reviewed. Constitutional: She is oriented to person, place, and time. She appears well-developed and well-nourished. No distress.  HENT:  Head: Normocephalic and atraumatic.  Eyes: EOM are normal.  Neck: Neck supple.  Cardiovascular: Normal rate.   Pulmonary/Chest: Effort normal.  Musculoskeletal:       Hands: Straight laceration to the palmar aspect of the right thumb approximately 2.5 cm and an avulsion laceration 1.5 cm to the left thumb near the nail.   Neurological: She is alert and oriented to person, place, and time. She has normal strength. No cranial nerve deficit or sensory deficit.  Skin: Skin is warm and dry.  Psychiatric: She has a normal mood and affect. Her behavior is normal.    ED Course  Procedures  LACERATION REPAIR Performed by: NEESE,HOPE Authorized by: NEESE,HOPE Consent: Verbal consent obtained. Risks and  benefits: risks, benefits and alternatives were discussed Consent given by: patient Patient identity confirmed: provided demographic data Prepped and Draped in normal sterile fashion  Wound cleaned with betadine  Wound explored  Laceration Location: right thumb palmar aspect and left thumb radial aspect  Laceration Length: right thumb 2.5 cm straight and left thumb 2 cm flap No Foreign Bodies seen or palpated  Anesthesia: local infiltration  Local anesthetic: lidocaine 1% without epinephrine  Anesthetic total: 3 ml  Irrigation method: syringe Amount of cleaning: standard  Skin closure: 5-0 prolene  Number of sutures: 3 to right thumb and 2 to left thumb  Technique: interrupted  Patient tolerance: Patient tolerated the procedure well with no immediate complications.  MDM  67 y.o. female with laceration to bilateral thumbs. Tetanus updated. Will start antibiotics. She will follow up with her PCP or return here in 7 days for suture removal. She will return sooner for any problems.  Discussed with the patient and all questioned fully answered.   Medication List    TAKE these medications       cephALEXin 500 MG capsule  Commonly known as:  KEFLEX  Take 1 capsule (500 mg total) by mouth 2 (two) times daily.      ASK your doctor about these medications       amitriptyline 50 MG tablet  Commonly known as:  ELAVIL  Take 1 tablet (50 mg total) by mouth at bedtime.     amLODipine 10 MG tablet  Commonly known as:  NORVASC  Take 10 mg by mouth at bedtime.     aspirin 81 MG tablet  Take 81 mg by mouth at bedtime.     atorvastatin 20 MG tablet  Commonly known as:  LIPITOR  Take 20 mg by mouth at bedtime.     insulin aspart 100 UNIT/ML injection  Commonly known as:  novoLOG  Inject 18 Units into the skin 3 (three) times daily before meals.     LANTUS SOLOSTAR 100 UNIT/ML Sopn  Generic drug:  Insulin Glargine  Inject 48 Units into the skin at bedtime.     metFORMIN  1000 MG tablet  Commonly known as:  GLUCOPHAGE  Take 1,000 mg by mouth at bedtime.           [place] [facility] Orlene Och, [place] 08/31/13 [phone]

## 2013-08-31 NOTE — ED Notes (Signed)
Pt cut both thumbs on an opened metal can, bleeding controlled, pt states that she has DM as well

## 2013-09-26 ENCOUNTER — Ambulatory Visit (INDEPENDENT_AMBULATORY_CARE_PROVIDER_SITE_OTHER): Payer: Medicare Other | Admitting: Family Medicine

## 2013-09-26 ENCOUNTER — Encounter: Payer: Self-pay | Admitting: Family Medicine

## 2013-09-26 VITALS — BP 108/78 | HR 80 | Temp 98.3°F | Resp 16 | Ht 66.0 in | Wt 228.0 lb

## 2013-09-26 DIAGNOSIS — I10 Essential (primary) hypertension: Secondary | ICD-10-CM

## 2013-09-26 DIAGNOSIS — E1140 Type 2 diabetes mellitus with diabetic neuropathy, unspecified: Secondary | ICD-10-CM

## 2013-09-26 DIAGNOSIS — E785 Hyperlipidemia, unspecified: Secondary | ICD-10-CM

## 2013-09-26 DIAGNOSIS — E1149 Type 2 diabetes mellitus with other diabetic neurological complication: Secondary | ICD-10-CM

## 2013-09-26 DIAGNOSIS — E1142 Type 2 diabetes mellitus with diabetic polyneuropathy: Secondary | ICD-10-CM

## 2013-09-26 DIAGNOSIS — Z0000 Encounter for general adult medical examination without abnormal findings: Secondary | ICD-10-CM

## 2013-09-26 LAB — BASIC METABOLIC PANEL
BUN: 11 mg/dL (ref 6–23)
CALCIUM: 9.3 mg/dL (ref 8.4–10.5)
CO2: 24 meq/L (ref 19–32)
CREATININE: 0.77 mg/dL (ref 0.50–1.10)
Chloride: 101 mEq/L (ref 96–112)
Glucose, Bld: 139 mg/dL — ABNORMAL HIGH (ref 70–99)
Potassium: 4.4 mEq/L (ref 3.5–5.3)
SODIUM: 138 meq/L (ref 135–145)

## 2013-09-26 LAB — HEMOGLOBIN A1C
HEMOGLOBIN A1C: 7.8 % — AB (ref <5.7)
MEAN PLASMA GLUCOSE: 177 mg/dL — AB (ref <117)

## 2013-09-26 LAB — LIPID PANEL
CHOL/HDL RATIO: 3.7 ratio
CHOLESTEROL: 203 mg/dL — AB (ref 0–200)
HDL: 55 mg/dL (ref >39)
LDL Cholesterol: 128 mg/dL — ABNORMAL HIGH (ref 0–99)
Triglycerides: 102 mg/dL (ref <150)
VLDL: 20 mg/dL (ref 0–40)

## 2013-09-26 NOTE — Patient Instructions (Signed)
I recommend eye visit once a year I recommend dental visit every 6 months Goal is to  Exercise 30 minutes 5 days a week We will send a letter if labs are all normal Call for your colonoscopy F/U 3 months

## 2013-09-26 NOTE — Assessment & Plan Note (Signed)
Continue Elavil. Continue to work to improve A1c to less than 7%

## 2013-09-26 NOTE — Assessment & Plan Note (Signed)
Her home readings are much improved. I will check an A1c today as well as a metabolic panel. We will adjust her insulin as needed

## 2013-09-26 NOTE — Assessment & Plan Note (Signed)
Well controlled 

## 2013-09-26 NOTE — Assessment & Plan Note (Signed)
We'll check lipid profile she's currently on Lipitor

## 2013-09-26 NOTE — Progress Notes (Signed)
Subjective:    Patient ID: Sue Wood, female    DOB: [birth year], 68 y.o.   MRN: [id number]  HPI  Subjective:   Patient presents for Medicare Annual/Subsequent preventive examination.   Patient here for complete physical exam. She's not a candidate for a Pap smear secondary to hysterectomy. Her mammogram is up-to-date. She is due to have colonoscopy this year as the gastroenterologist sent a letter referring this to 2015. She is due to have fasting labs today Immunizations are up to date with exception of shingles vaccine which she is unable to afford at this time   She is still due for eye visit     DM- CBG  30 day average  133                   14 day avg 134     Fasting <140 , post meals 170-220 Taking lantus 48units, and 18 units Lunch and dinner, occ gives novolog with breakfast, no hypoglycemia  Review Past Medical/Family/Social: - per EMR    Risk Factors  Current exercise habits: none Dietary issues discussed: Yes  Cardiac risk factors: Obesity (BMI >= 30 kg/m2).   Depression Screen  (Note: if answer to either of the following is "Yes", a more complete depression screening is indicated)  Over the past two weeks, have you felt down, depressed or hopeless? No Over the past two weeks, have you felt little interest or pleasure in doing things? No Have you lost interest or pleasure in daily life? No Do you often feel hopeless? No Do you cry easily over simple problems? No   Activities of Daily Living  In your present state of health, do you have any difficulty performing the following activities?:  Driving? No  Managing money? No  Feeding yourself? No  Getting from bed to chair? No  Climbing a flight of stairs? No  Preparing food and eating?: No  Bathing or showering? No  Getting dressed: No  Getting to the toilet? No  Using the toilet:No  Moving around from place to place: No  In the past year have you fallen or had a near fall?:No  Are you sexually  active? No  Do you have more than one partner? No   Hearing Difficulties: No  Do you often ask people to speak up or repeat themselves? No  Do you experience ringing or noises in your ears? No Do you have difficulty understanding soft or whispered voices? No  Do you feel that you have a problem with memory? No Do you often misplace items? No  Do you feel safe at home? Yes  Cognitive Testing  Alert? Yes Normal Appearance?Yes  Oriented to person? Yes Place? Yes  Time? Yes  Recall of three objects? Yes  Can perform simple calculations? Yes  Displays appropriate judgment?Yes  Can read the correct time from a watch face?Yes   List the Names of Other Physician/Practitioners you currently use: None    Screening Tests / Date Colonoscopy  - Pending                  Zostavax- unable to afford  Mammogram - UTD Influenza Vaccine - UTD Tetanus/tdap- UTD    Assessment:    Annual wellness medicare exam   Plan:    During the course of the visit the patient was educated and counseled about appropriate screening and preventive services including:    Colorectal cancer screening  Shingles vaccine. Prescription given to that she  can get the vaccine at the pharmacy or Medicare part D.    Diet review for nutrition referral? Yes ____ Not Indicated __x__  Patient Instructions (the written plan) was given to the patient.  Medicare Attestation  I have personally reviewed:  The patient's medical and social history  Their use of alcohol, tobacco or illicit drugs  Their current medications and supplements  The patient's functional ability including ADLs,fall risks, home safety risks, cognitive, and hearing and visual impairment  Diet and physical activities  Evidence for depression or mood disorders  The patient's weight, height, BMI, and visual acuity have been recorded in the chart. I have made referrals, counseling, and provided education to the patient based on review of the above and I  have provided the patient with a written personalized care plan for preventive services.        Review of Systems  GEN- denies fatigue, fever, weight loss,weakness, recent illness HEENT- denies eye drainage, change in vision, nasal discharge, CVS- denies chest pain, palpitations RESP- denies SOB, cough, wheeze ABD- denies N/V, change in stools, abd pain GU- denies dysuria, hematuria, dribbling, incontinence MSK- denies joint pain, muscle aches, injury Neuro- denies headache, dizziness, syncope, seizure activity      Objective:   Physical Exam GEN- NAD, alert and oriented x3 HEENT- PERRL, EOMI, non injected sclera, pink conjunctiva, MMM, oropharynx clear Neck- Supple, no bruit CVS- RRR, no murmur RESP-CTAB EXT- No edema Pulses- Radial, DP- 2+        Assessment & Plan:

## 2013-10-06 MED ORDER — INSULIN GLARGINE 100 UNIT/ML SOLOSTAR PEN: 48.0000 [IU] | PEN_INJECTOR | Freq: Every day | SUBCUTANEOUS | Status: DC

## 2013-10-06 MED ORDER — INSULIN ASPART 100 UNIT/ML FLEXPEN: 18.0000 [IU] | PEN_INJECTOR | Freq: Three times a day (TID) | SUBCUTANEOUS | Status: DC

## 2013-10-06 NOTE — Addendum Note (Signed)
Addended by: Daylene Posey T on: 10/06/2013 12:14 PM   Modules accepted: Orders

## 2013-10-14 ENCOUNTER — Other Ambulatory Visit (INDEPENDENT_AMBULATORY_CARE_PROVIDER_SITE_OTHER): Payer: Self-pay | Admitting: *Deleted

## 2013-10-14 DIAGNOSIS — Z1211 Encounter for screening for malignant neoplasm of colon: Secondary | ICD-10-CM

## 2013-10-19 ENCOUNTER — Other Ambulatory Visit: Payer: Self-pay | Admitting: *Deleted

## 2013-10-19 MED ORDER — INSULIN DETEMIR 100 UNIT/ML FLEXPEN: 48.0000 [IU] | PEN_INJECTOR | Freq: Every day | SUBCUTANEOUS | Status: DC

## 2013-10-19 NOTE — Telephone Encounter (Signed)
Changed to levemir pen d/t pt insurance will not cover lantus, pt aware

## 2013-10-27 ENCOUNTER — Encounter (INDEPENDENT_AMBULATORY_CARE_PROVIDER_SITE_OTHER): Payer: Self-pay | Admitting: *Deleted

## 2013-10-27 ENCOUNTER — Telehealth (INDEPENDENT_AMBULATORY_CARE_PROVIDER_SITE_OTHER): Payer: Self-pay | Admitting: *Deleted

## 2013-10-27 DIAGNOSIS — Z1211 Encounter for screening for malignant neoplasm of colon: Secondary | ICD-10-CM

## 2013-10-27 MED ORDER — PEG 3350-KCL-NA BICARB-NACL 420 G PO SOLR: 4000.0000 mL | Freq: Once | ORAL | Status: DC

## 2013-10-27 NOTE — Telephone Encounter (Signed)
Patient needs trilyte 

## 2013-11-07 ENCOUNTER — Other Ambulatory Visit: Payer: Self-pay | Admitting: Family Medicine

## 2013-11-08 NOTE — Telephone Encounter (Signed)
Refill appropriate and filled per protocol. 

## 2013-11-25 ENCOUNTER — Telehealth (INDEPENDENT_AMBULATORY_CARE_PROVIDER_SITE_OTHER): Payer: Self-pay | Admitting: *Deleted

## 2013-11-25 NOTE — Telephone Encounter (Signed)
  Procedure: tcs  Reason/Indication:  screening  Has patient had this procedure before?  Yes, 10 years or more  If so, when, by whom and where?    Is there a family history of colon cancer?  no  Who?  What age when diagnosed?    Is patient diabetic?   yes      Does patient have prosthetic heart valve?  no  Do you have a pacemaker?  no  Has patient ever had endocarditis? no  Has patient had joint replacement within last 12 months?  no  Does patient tend to be constipated or take laxatives? ocassionally  Is patient on Coumadin, Plavix and/or Aspirin? yes  Medications: amitriptyline 50 mg daily, amlodipine 10 mg daily, atorvastatin 20 mg daily, lantus 50 units ay bedtime, novolog 18 units tid, asa 81 mg daily  Allergies: lisinopril  Medication Adjustment: asa 2 days, decrease lantus to 25 units evening before and decrease novolog to 8 units with each meal   Procedure date & time: 12/08/13

## 2013-11-28 NOTE — Telephone Encounter (Signed)
agree

## 2013-11-29 ENCOUNTER — Encounter (HOSPITAL_COMMUNITY): Payer: Self-pay | Admitting: Pharmacy Technician

## 2013-12-08 ENCOUNTER — Encounter (HOSPITAL_COMMUNITY): Payer: Self-pay | Admitting: *Deleted

## 2013-12-08 ENCOUNTER — Encounter (HOSPITAL_COMMUNITY)
Admission: RE | Disposition: A | Discharge status: Home / Self Care | Payer: Self-pay | Source: Ambulatory Visit | Attending: Internal Medicine

## 2013-12-08 ENCOUNTER — Ambulatory Visit (HOSPITAL_COMMUNITY)
Admission: RE | Admit: 2013-12-08 | Discharge: 2013-12-08 | Disposition: A | Discharge status: Home / Self Care | Payer: Medicare HMO | Source: Ambulatory Visit | Attending: Internal Medicine | Admitting: Internal Medicine

## 2013-12-08 DIAGNOSIS — Z01812 Encounter for preprocedural laboratory examination: Secondary | ICD-10-CM | POA: Insufficient documentation

## 2013-12-08 DIAGNOSIS — D126 Benign neoplasm of colon, unspecified: Secondary | ICD-10-CM

## 2013-12-08 DIAGNOSIS — E785 Hyperlipidemia, unspecified: Secondary | ICD-10-CM | POA: Insufficient documentation

## 2013-12-08 DIAGNOSIS — K5730 Diverticulosis of large intestine without perforation or abscess without bleeding: Secondary | ICD-10-CM

## 2013-12-08 DIAGNOSIS — Z87891 Personal history of nicotine dependence: Secondary | ICD-10-CM | POA: Insufficient documentation

## 2013-12-08 DIAGNOSIS — K644 Residual hemorrhoidal skin tags: Secondary | ICD-10-CM

## 2013-12-08 DIAGNOSIS — Z7982 Long term (current) use of aspirin: Secondary | ICD-10-CM | POA: Insufficient documentation

## 2013-12-08 DIAGNOSIS — I10 Essential (primary) hypertension: Secondary | ICD-10-CM | POA: Insufficient documentation

## 2013-12-08 DIAGNOSIS — E119 Type 2 diabetes mellitus without complications: Secondary | ICD-10-CM | POA: Insufficient documentation

## 2013-12-08 DIAGNOSIS — Z1211 Encounter for screening for malignant neoplasm of colon: Secondary | ICD-10-CM

## 2013-12-08 DIAGNOSIS — Z794 Long term (current) use of insulin: Secondary | ICD-10-CM | POA: Insufficient documentation

## 2013-12-08 DIAGNOSIS — Z79899 Other long term (current) drug therapy: Secondary | ICD-10-CM | POA: Insufficient documentation

## 2013-12-08 HISTORY — PX: COLONOSCOPY: SHX5424

## 2013-12-08 LAB — GLUCOSE, CAPILLARY
GLUCOSE-CAPILLARY: 293 mg/dL — AB (ref 70–99)
Glucose-Capillary: 314 mg/dL — ABNORMAL HIGH (ref 70–99)

## 2013-12-08 SURGERY — COLONOSCOPY
Anesthesia: Moderate Sedation

## 2013-12-08 MED ORDER — INSULIN ASPART 100 UNIT/ML ~~LOC~~ SOLN
4.0000 [IU] | Freq: Once | SUBCUTANEOUS | Status: AC
  Administered 2013-12-08: 4 [IU] via SUBCUTANEOUS

## 2013-12-08 MED ORDER — MIDAZOLAM HCL 5 MG/5ML IJ SOLN
INTRAMUSCULAR | Status: DC | PRN
  Administered 2013-12-08: 1 mg via INTRAVENOUS
  Administered 2013-12-08: 2 mg via INTRAVENOUS
  Administered 2013-12-08: 1 mg via INTRAVENOUS
  Administered 2013-12-08: 2 mg via INTRAVENOUS

## 2013-12-08 MED ORDER — MEPERIDINE HCL 50 MG/ML IJ SOLN
INTRAMUSCULAR | Status: DC | PRN
  Administered 2013-12-08 (×2): 25 mg via INTRAVENOUS

## 2013-12-08 MED ORDER — SIMETHICONE 40 MG/0.6ML PO SUSP
ORAL | Status: DC | PRN
  Administered 2013-12-08: 10:00:00

## 2013-12-08 MED ORDER — SODIUM CHLORIDE 0.9 % IV SOLN
INTRAVENOUS | Status: DC
  Administered 2013-12-08: 09:00:00 via INTRAVENOUS

## 2013-12-08 MED ORDER — INSULIN ASPART 100 UNIT/ML ~~LOC~~ SOLN
5.0000 [IU] | Freq: Once | SUBCUTANEOUS | Status: AC
  Administered 2013-12-08: 5 [IU] via SUBCUTANEOUS
  Filled 2013-12-08: qty 0.05

## 2013-12-08 MED ORDER — MEPERIDINE HCL 50 MG/ML IJ SOLN
INTRAMUSCULAR | Status: AC
  Filled 2013-12-08: qty 1

## 2013-12-08 MED ORDER — MIDAZOLAM HCL 5 MG/5ML IJ SOLN
INTRAMUSCULAR | Status: AC
  Filled 2013-12-08: qty 10

## 2013-12-08 NOTE — OR Nursing (Signed)
Dr. Laural Golden notified of blood sugar-314. Orders received and carried out.

## 2013-12-08 NOTE — Discharge Instructions (Signed)
Resume usual medications and high fiber diet. No driving for 24 hours. Physician will call with biopsy results.       Colon Polyps Polyps are lumps of extra tissue growing inside the body. Polyps can grow in the large intestine (colon). Most colon polyps are noncancerous (benign). However, some colon polyps can become cancerous over time. Polyps that are larger than a pea may be harmful. To be safe, caregivers remove and test all polyps. CAUSES  Polyps form when mutations in the genes cause your cells to grow and divide even though no more tissue is needed. RISK FACTORS There are a number of risk factors that can increase your chances of getting colon polyps. They include:  Being older than 50 years.  Family history of colon polyps or colon cancer.  Long-term colon diseases, such as colitis or Crohn disease.  Being overweight.  Smoking.  Being inactive.  Drinking too much alcohol. SYMPTOMS  Most small polyps do not cause symptoms. If symptoms are present, they may include:  Blood in the stool. The stool may look dark red or black.  Constipation or diarrhea that lasts longer than 1 week. DIAGNOSIS People often do not know they have polyps until their caregiver finds them during a regular checkup. Your caregiver can use 4 tests to check for polyps:  Digital rectal exam. The caregiver wears gloves and feels inside the rectum. This test would find polyps only in the rectum.  Barium enema. The caregiver puts a liquid called barium into your rectum before taking X-rays of your colon. Barium makes your colon look white. Polyps are dark, so they are easy to see in the X-ray pictures.  Sigmoidoscopy. A thin, flexible tube (sigmoidoscope) is placed into your rectum. The sigmoidoscope has a light and tiny camera in it. The caregiver uses the sigmoidoscope to look at the last third of your colon.  Colonoscopy. This test is like sigmoidoscopy, but the caregiver looks at the entire colon.  This is the most common method for finding and removing polyps. TREATMENT  Any polyps will be removed during a sigmoidoscopy or colonoscopy. The polyps are then tested for cancer. PREVENTION  To help lower your risk of getting more colon polyps:  Eat plenty of fruits and vegetables. Avoid eating fatty foods.  Do not smoke.  Avoid drinking alcohol.  Exercise every day.  Lose weight if recommended by your caregiver.  Eat plenty of calcium and folate. Foods that are rich in calcium include milk, cheese, and broccoli. Foods that are rich in folate include chickpeas, kidney beans, and spinach. HOME CARE INSTRUCTIONS Keep all follow-up appointments as directed by your caregiver. You may need periodic exams to check for polyps. SEEK MEDICAL CARE IF: You notice bleeding during a bowel movement. Document Released: 05/21/2004 Document Revised: 11/17/2011 Document Reviewed: 11/04/2011 [facility] Patient Information 2014 [place].   High-Fiber Diet Fiber is found in fruits, vegetables, and grains. A high-fiber diet encourages the addition of more whole grains, legumes, fruits, and vegetables in your diet. The recommended amount of fiber for adult males is 38 g per day. For adult females, it is 25 g per day. Pregnant and lactating women should get 28 g of fiber per day. If you have a digestive or bowel problem, ask your caregiver for advice before adding high-fiber foods to your diet. Eat a variety of high-fiber foods instead of only a select few type of foods.  PURPOSE  To increase stool bulk.  To make bowel movements more regular  to prevent constipation.  To lower cholesterol.  To prevent overeating. WHEN IS THIS DIET USED?  It may be used if you have constipation and hemorrhoids.  It may be used if you have uncomplicated diverticulosis (intestine condition) and irritable bowel syndrome.  It may be used if you need help with weight management.  It may be used if you want to add  it to your diet as a protective measure against atherosclerosis, diabetes, and cancer. SOURCES OF FIBER  Whole-grain breads and cereals.  Fruits, such as apples, oranges, bananas, berries, prunes, and pears.  Vegetables, such as green peas, carrots, sweet potatoes, beets, broccoli, cabbage, spinach, and artichokes.  Legumes, such split peas, soy, lentils.  Almonds. FIBER CONTENT IN FOODS Starches and Grains / Dietary Fiber (g)  Cheerios, 1 cup / 3 g  Corn Flakes cereal, 1 cup / 0.7 g  Rice crispy treat cereal, 1 cup / 0.3 g  Instant oatmeal (cooked),  cup / 2 g  Frosted wheat cereal, 1 cup / 5.1 g  Brown, long-grain rice (cooked), 1 cup / 3.5 g  White, long-grain rice (cooked), 1 cup / 0.6 g  Enriched macaroni (cooked), 1 cup / 2.5 g Legumes / Dietary Fiber (g)  Baked beans (canned, plain, or vegetarian),  cup / 5.2 g  Kidney beans (canned),  cup / 6.8 g  Pinto beans (cooked),  cup / 5.5 g Breads and Crackers / Dietary Fiber (g)  Plain or honey graham crackers, 2 squares / 0.7 g  Saltine crackers, 3 squares / 0.3 g  Plain, salted pretzels, 10 pieces / 1.8 g  Whole-wheat bread, 1 slice / 1.9 g  White bread, 1 slice / 0.7 g  Raisin bread, 1 slice / 1.2 g  Plain bagel, 3 oz / 2 g  Flour tortilla, 1 oz / 0.9 g  Corn tortilla, 1 small / 1.5 g  Hamburger or hotdog bun, 1 small / 0.9 g Fruits / Dietary Fiber (g)  Apple with skin, 1 medium / 4.4 g  Sweetened applesauce,  cup / 1.5 g  Banana,  medium / 1.5 g  Grapes, 10 grapes / 0.4 g  Orange, 1 small / 2.3 g  Raisin, 1.5 oz / 1.6 g  Melon, 1 cup / 1.4 g Vegetables / Dietary Fiber (g)  Green beans (canned),  cup / 1.3 g  Carrots (cooked),  cup / 2.3 g  Broccoli (cooked),  cup / 2.8 g  Peas (cooked),  cup / 4.4 g  Mashed potatoes,  cup / 1.6 g  Lettuce, 1 cup / 0.5 g  Corn (canned),  cup / 1.6 g  Tomato,  cup / 1.1 g Document Released: 08/25/2005 Document Revised:  02/24/2012 Document Reviewed: 11/27/2011 [facility] Patient Information 2014 [place], [place].

## 2013-12-08 NOTE — H&P (Signed)
Sue Wood is an 68 y.o. female.   Chief Complaint: Patient is here for colonoscopy. HPI: Patient is 68 year old African female presented for screening colonoscopy. She denies abdominal pain change in bowel habits or rectal bleeding. Last colonoscopy was 10 years ago. Family history is negative for CRC.  Past Medical History  Diagnosis Date  . Hypertension   . Diabetes mellitus   . Arthritis   . Hyperlipidemia   . Peripheral neuropathy     Past Surgical History  Procedure Laterality Date  . Knee arthroscopy  1998    left  . Abdominal hysterectomy    . Cesarean section      X 2    Family History  Problem Relation Age of Onset  . Hypertension Mother   . Hyperlipidemia Mother   . Hypertension Father   . Hyperlipidemia Father   . Diabetes Father   . Colon cancer Neg Hx    Social History:  reports that she has quit smoking. Her smoking use included Cigarettes. She has a 4.5 pack-year smoking history. She does not have any smokeless tobacco history on file. She reports that she does not drink alcohol or use illicit drugs.  Allergies:  Allergies  Allergen Reactions  . Lisinopril Swelling    Lip swelling     Medications Prior to Admission  Medication Sig Dispense Refill  . amitriptyline (ELAVIL) 50 MG tablet TAKE ONE TABLET BY MOUTH AT BEDTIME  30 tablet  0  . amLODipine (NORVASC) 10 MG tablet TAKE ONE TABLET BY MOUTH ONCE DAILY  30 tablet  0  . aspirin 81 MG tablet Take 81 mg by mouth at bedtime.       Marland Kitchen atorvastatin (LIPITOR) 20 MG tablet TAKE ONE TABLET BY MOUTH ONCE DAILY  30 tablet  0  . insulin aspart (NOVOLOG FLEXPEN) 100 UNIT/ML FlexPen Inject 18 Units into the skin 3 (three) times daily with meals.  15 mL  3  . Insulin Detemir (LEVEMIR) 100 UNIT/ML Pen Inject 48 Units into the skin daily at 10 pm.  15 mL  3  . metFORMIN (GLUCOPHAGE) 1000 MG tablet Take 1,000 mg by mouth at bedtime.        Results for orders placed during the hospital encounter of 12/08/13  (from the past 48 hour(s))  GLUCOSE, CAPILLARY     Status: Abnormal   Collection Time    12/08/13  8:58 AM      Result Value Ref Range   Glucose-Capillary 314 (*) 70 - 99 mg/dL   Comment 1 Notify RN     No results found.  ROS  Blood pressure 149/79, pulse 97, temperature 98.3 F (36.8 C), temperature source Oral, resp. rate 18, height 5\' 7"  (1.702 m), weight 228 lb (103.42 kg), SpO2 98.00%. Physical Exam  Constitutional: She appears well-developed and well-nourished.  HENT:  Mouth/Throat: Oropharynx is clear and moist.  Eyes: Conjunctivae are normal. No scleral icterus.  Neck: No thyromegaly present.  Cardiovascular: Normal rate, regular rhythm and normal heart sounds.   No murmur heard. Respiratory: Effort normal and breath sounds normal.  GI: Soft. She exhibits no distension and no mass. There is no tenderness.  Musculoskeletal: She exhibits no edema.  Lymphadenopathy:    She has no cervical adenopathy.  Neurological: She is alert.  Skin: Skin is warm and dry.     Assessment/Plan Average risk screening colonoscopy.  Shia Eber U 12/08/2013, 9:32 AM

## 2013-12-08 NOTE — Op Note (Addendum)
COLONOSCOPY PROCEDURE REPORT  PATIENT:  Sue Wood  MR#:  [id number] Birthdate:  10/17/1945, 68 y.o., female Endoscopist:  Dr. Rogene Houston, MD Referred By:  Dr. Vic Blackbird, MD Procedure Date: 12/08/2013  Procedure:   Colonoscopy  Indications: Patient is 68 year old African female was undergoing average risk screening colonoscopy.  Informed Consent:  The procedure and risks were reviewed with the patient and informed consent was obtained.  Medications:  Demerol 50 mg IV Versed 6 mg IV  Description of procedure:  After a digital rectal exam was performed, that colonoscope was advanced from the anus through the rectum and colon to the area of the cecum, ileocecal valve and appendiceal orifice. The cecum was deeply intubated. These structures were well-seen and photographed for the record. From the level of the cecum and ileocecal valve, the scope was slowly and cautiously withdrawn. The mucosal surfaces were carefully surveyed utilizing scope tip to flexion to facilitate fold flattening as needed. The scope was pulled down into the rectum where a thorough exam including retroflexion was performed.  Findings:   Prep satisfactory. Small cecal polyp blader via cold biopsy. Redundant colon with few diverticula at sigmoid colon. Normal rectal mucosa. Small hemorrhoids below the dentate line.   Therapeutic/Diagnostic Maneuvers Performed:  See above  Complications:  None  Cecal Withdrawal Time:  10 minutes  Impression:  Examination performed to cecum. Small cecal polyp ablated via cold biopsy. Mild sigmoid colon diverticulosis. Small external hemorrhoids.  Recommendations:  Standard instructions given. I will contact patient with biopsy results and further recommendations.  Zahriah Roes U  12/08/2013 10:20 AM  CC: Dr. Vic Blackbird, MD & Dr. Rayne Du ref. provider found

## 2013-12-12 ENCOUNTER — Other Ambulatory Visit: Payer: Self-pay | Admitting: *Deleted

## 2013-12-12 ENCOUNTER — Other Ambulatory Visit: Payer: Self-pay | Admitting: Family Medicine

## 2013-12-12 NOTE — Telephone Encounter (Signed)
Refill appropriate and filled per protocol. 

## 2013-12-13 ENCOUNTER — Encounter (HOSPITAL_COMMUNITY): Payer: Self-pay | Admitting: Internal Medicine

## 2013-12-27 ENCOUNTER — Encounter: Payer: Self-pay | Admitting: Family Medicine

## 2013-12-27 ENCOUNTER — Ambulatory Visit (INDEPENDENT_AMBULATORY_CARE_PROVIDER_SITE_OTHER): Payer: Medicare HMO | Admitting: Family Medicine

## 2013-12-27 VITALS — BP 130/76 | HR 84 | Temp 98.6°F | Resp 16 | Ht 67.0 in | Wt 223.0 lb

## 2013-12-27 DIAGNOSIS — E1149 Type 2 diabetes mellitus with other diabetic neurological complication: Secondary | ICD-10-CM

## 2013-12-27 DIAGNOSIS — E669 Obesity, unspecified: Secondary | ICD-10-CM

## 2013-12-27 DIAGNOSIS — I10 Essential (primary) hypertension: Secondary | ICD-10-CM

## 2013-12-27 DIAGNOSIS — E785 Hyperlipidemia, unspecified: Secondary | ICD-10-CM

## 2013-12-27 LAB — CBC WITH DIFFERENTIAL/PLATELET
BASOS ABS: 0 10*3/uL (ref 0.0–0.1)
BASOS PCT: 0 % (ref 0–1)
EOS ABS: 0.1 10*3/uL (ref 0.0–0.7)
EOS PCT: 3 % (ref 0–5)
HCT: 35.1 % — ABNORMAL LOW (ref 36.0–46.0)
Hemoglobin: 11.8 g/dL — ABNORMAL LOW (ref 12.0–15.0)
Lymphocytes Relative: 30 % (ref 12–46)
Lymphs Abs: 1.4 10*3/uL (ref 0.7–4.0)
MCH: 28.1 pg (ref 26.0–34.0)
MCHC: 33.6 g/dL (ref 30.0–36.0)
MCV: 83.6 fL (ref 78.0–100.0)
Monocytes Absolute: 0.2 10*3/uL (ref 0.1–1.0)
Monocytes Relative: 5 % (ref 3–12)
NEUTROS PCT: 62 % (ref 43–77)
Neutro Abs: 2.9 10*3/uL (ref 1.7–7.7)
PLATELETS: 285 10*3/uL (ref 150–400)
RBC: 4.2 MIL/uL (ref 3.87–5.11)
RDW: 15.2 % (ref 11.5–15.5)
WBC: 4.6 10*3/uL (ref 4.0–10.5)

## 2013-12-27 LAB — HEMOGLOBIN A1C, FINGERSTICK: Hgb A1C (fingerstick): 9.8 % — ABNORMAL HIGH (ref <5.7)

## 2013-12-27 LAB — COMPREHENSIVE METABOLIC PANEL
ALK PHOS: 91 U/L (ref 39–117)
ALT: 18 U/L (ref 0–35)
AST: 16 U/L (ref 0–37)
Albumin: 4.2 g/dL (ref 3.5–5.2)
BUN: 13 mg/dL (ref 6–23)
CO2: 26 mEq/L (ref 19–32)
CREATININE: 0.84 mg/dL (ref 0.50–1.10)
Calcium: 9.5 mg/dL (ref 8.4–10.5)
Chloride: 97 mEq/L (ref 96–112)
Glucose, Bld: 293 mg/dL — ABNORMAL HIGH (ref 70–99)
Potassium: 4.4 mEq/L (ref 3.5–5.3)
SODIUM: 133 meq/L — AB (ref 135–145)
TOTAL PROTEIN: 7.6 g/dL (ref 6.0–8.3)
Total Bilirubin: 0.4 mg/dL (ref 0.2–1.2)

## 2013-12-27 LAB — LIPID PANEL
Cholesterol: 187 mg/dL (ref 0–200)
HDL: 57 mg/dL (ref >39)
LDL Cholesterol: 111 mg/dL — ABNORMAL HIGH (ref 0–99)
Total CHOL/HDL Ratio: 3.3 Ratio
Triglycerides: 95 mg/dL (ref <150)
VLDL: 19 mg/dL (ref 0–40)

## 2013-12-27 MED ORDER — METFORMIN HCL 500 MG PO TABS: 1000.0000 mg | ORAL_TABLET | Freq: Two times a day (BID) | ORAL | Status: DC

## 2013-12-27 NOTE — Assessment & Plan Note (Signed)
Her A1c is worse at 9.8%. She's not taking the metformin on a regular basis and notices contributing. She is on a good amount of insulin already. I'll have her continue Levemir 50 units as her fastings are okay. She will increase to 20 units of NovoLog to cover her meals if those are didn't 2 to 300s. I will also change her to metformin 500 mg 2 tablets twice a day as these are smaller. I've also given her Invokana 100 mg once a day

## 2013-12-27 NOTE — Progress Notes (Signed)
Patient ID: Sue Wood, female   DOB: 1946/07/24, 68 y.o.   MRN: [id number]   Subjective:    Patient ID: Sue Wood, female    DOB: 1946/02/02, 68 y.o.   MRN: [id number]  Patient presents for 3 month F/U  issue to follow chronic medical problems. Diabetes mellitus her last A1c was 7.8% in January 2015. She was increased Levemir 50 units as well as NovoLog 18 units with each meal. She's had some difficulty taking her metformin she is 1000 mg twice a day however this pill gets stuck in her chest and causes her to vomit at night. Does not have any difficulties with her other pills. She did try breaking it in half but it gave her sweet taste been made her nauseous. She's not had any diarrhea with the metformin. She's been trying to continue this for the past 3 or 4 months for now will like to change it. Her blood sugars fasting have been less than 200 however before meals they have been 200-300. Hyperlipidemia at her last visit her LDL was still elevated she was supposed to take Lipitor 40 mg but he seems his message did not get to her. She's been taking Lipitor 20 mg per    Review Of Systems:  GEN- denies fatigue, fever, weight loss,weakness, recent illness HEENT- denies eye drainage, change in vision, nasal discharge, CVS- denies chest pain, palpitations RESP- denies SOB, cough, wheeze ABD- denies N/V, change in stools, abd pain Neuro- denies headache, dizziness, syncope, seizure activity       Objective:    BP 130/76  Pulse 84  Temp(Src) 98.6 F (37 C) (Oral)  Resp 16  Ht 5\' 7"  (1.702 m)  Wt 223 lb (101.152 kg)  BMI 34.92 kg/m2 GEN- NAD, alert and oriented x3, weight down 5lbs HEENT- PERRL, EOMI, non injected sclera, pink conjunctiva, MMM, oropharynx clear Neck- Supple, no Bruit CVS- RRR, no murmur RESP-CTAB EXT- Trace pedal edema Pulses- Radial, DP- 2+        Assessment & Plan:      Problem List Items Addressed This Visit   Type II or unspecified type  diabetes mellitus with neurological manifestations, not stated as uncontrolled(250.60) - Primary   Relevant Medications      metFORMIN (GLUCOPHAGE) tablet   Other Relevant Orders      CBC with Differential      Comprehensive metabolic panel      Hemoglobin A1C, fingerstick   Obesity     5 pound weight loss noted    Relevant Medications      metFORMIN (GLUCOPHAGE) tablet   Hyperlipidemia     She's only been taking Lipitor 20 mg instead of 40 mg. Her lipid panel will be rechecked today looking get her the correct dose    Relevant Orders      Lipid panel   Essential hypertension, benign     Blood pressure looks good no change in medication       Note: This dictation was prepared with Dragon dictation along with smaller phrase technology. Any transcriptional errors that result from this process are unintentional.

## 2013-12-27 NOTE — Patient Instructions (Addendum)
New prescription for metformin sent  Levemir 50 units Novolog 20 units with meals Start Invokana 100mg  once a day with breakfast A1C is 9.8% F/U 4 weeks

## 2013-12-27 NOTE — Assessment & Plan Note (Signed)
She's only been taking Lipitor 20 mg instead of 40 mg. Her lipid panel will be rechecked today looking get her the correct dose

## 2013-12-27 NOTE — Assessment & Plan Note (Signed)
5 pound weight loss noted

## 2013-12-27 NOTE — Assessment & Plan Note (Signed)
Blood pressure looks good no change in medication 

## 2013-12-29 ENCOUNTER — Encounter: Payer: Self-pay | Admitting: *Deleted

## 2014-01-04 ENCOUNTER — Other Ambulatory Visit: Payer: Self-pay | Admitting: *Deleted

## 2014-01-04 MED ORDER — ATORVASTATIN CALCIUM 20 MG PO TABS: ORAL_TABLET | ORAL | Status: DC

## 2014-01-04 NOTE — Telephone Encounter (Signed)
Refill appropriate and filled per protocol. 

## 2014-01-24 ENCOUNTER — Encounter: Payer: Self-pay | Admitting: Family Medicine

## 2014-01-24 ENCOUNTER — Ambulatory Visit (INDEPENDENT_AMBULATORY_CARE_PROVIDER_SITE_OTHER): Payer: Medicare HMO | Admitting: Family Medicine

## 2014-01-24 VITALS — BP 130/72 | HR 82 | Temp 98.3°F | Resp 16 | Ht 67.0 in | Wt 221.0 lb

## 2014-01-24 DIAGNOSIS — S91209A Unspecified open wound of unspecified toe(s) with damage to nail, initial encounter: Secondary | ICD-10-CM | POA: Insufficient documentation

## 2014-01-24 DIAGNOSIS — E1149 Type 2 diabetes mellitus with other diabetic neurological complication: Secondary | ICD-10-CM

## 2014-01-24 DIAGNOSIS — S91109A Unspecified open wound of unspecified toe(s) without damage to nail, initial encounter: Secondary | ICD-10-CM

## 2014-01-24 MED ORDER — CANAGLIFLOZIN 100 MG PO TABS: 1.0000 | ORAL_TABLET | Freq: Every day | ORAL | Status: DC

## 2014-01-24 MED ORDER — CEPHALEXIN 500 MG PO CAPS: 500.0000 mg | ORAL_CAPSULE | Freq: Two times a day (BID) | ORAL | Status: DC

## 2014-01-24 MED ORDER — INSULIN DETEMIR 100 UNIT/ML FLEXPEN: PEN_INJECTOR | SUBCUTANEOUS | Status: DC

## 2014-01-24 NOTE — Assessment & Plan Note (Signed)
Nail was only held on at the end the area and nail plate. I was able to use some needle holders interim the nail and remove it completely without any anesthesia. Secondary to the drainage noted I will cover her with antibiotics and she is an uncontrolled diabetic shoes given Keflex twice a day. I will also have her do Salt Soaks.

## 2014-01-24 NOTE — Patient Instructions (Signed)
Soak foot in epsom salt  Take antibiotics by mouth Apply triple antibiotic cream for the next few days and keep covered Continue diabetic medications and Invokana  F/U 1ST WEEK IN [place]

## 2014-01-24 NOTE — Assessment & Plan Note (Signed)
Her blood sugars are gently starting to improve. I will have her continue the current regimen I sent over prescription for the input,. For some reason is not covered by her insurance we will try substituting or give her Januvia 100 mg.

## 2014-01-24 NOTE — Progress Notes (Signed)
Patient ID: Sue Wood, female   DOB: [birth year], 68 y.o.   MRN: [id number]   Subjective:    Patient ID: Sue Wood, female    DOB: 11-28-1945, 68 y.o.   MRN: [id number]  Patient presents for 4 week F/U and L foot, great toe nail  patient here for interim followup visit for her diabetes mellitus and her last visit and blood, 100 mg was started. Her metformin was also change to that that she will be able to take 1000 mg twice a day without any difficulty. She's also still taking her Levemir as prescribed. Her blood sugars now fastings have been 150 to 170s. Before meals her blood sugars are 145-157. She's not had any difficulty with the medications no hypoglycemia.   She does note about 2 weeks ago she toppled out of her recliner chair and stump her toe nail the nail has lifted off it looks like it is going to fall off. She had some mild drainage however she cleaned it with peroxide. She did schedule visit with podiatry for this week    Review Of Systems:  GEN- denies fatigue, fever, weight loss,weakness, recent illness MSK- denies joint pain, muscle aches, injury Neuro- denies headache, dizziness, syncope, seizure activity       Objective:    BP 130/72  Pulse 82  Temp(Src) 98.3 F (36.8 C) (Oral)  Resp 16  Ht 5\' 7"  (1.702 m)  Wt 221 lb (100.245 kg)  BMI 34.61 kg/m2 GEN- NAD, alert and oriented x3 Skin- Left Great toe- nail avulsed only attacked at distal nailbed on left side, mild odor noted, mild yellow drainage, no bleeding, NT  EXT- No edema Pulses- Radial, DP- 2+        Assessment & Plan:      Problem List Items Addressed This Visit   None      Note: This dictation was prepared with Dragon dictation along with smaller phrase technology. Any transcriptional errors that result from this process are unintentional.

## 2014-01-26 ENCOUNTER — Other Ambulatory Visit: Payer: Self-pay | Admitting: Podiatry

## 2014-01-26 ENCOUNTER — Encounter: Payer: Self-pay | Admitting: Podiatry

## 2014-01-26 ENCOUNTER — Ambulatory Visit (INDEPENDENT_AMBULATORY_CARE_PROVIDER_SITE_OTHER): Payer: Medicare HMO | Admitting: Podiatry

## 2014-01-26 ENCOUNTER — Ambulatory Visit (INDEPENDENT_AMBULATORY_CARE_PROVIDER_SITE_OTHER): Payer: Medicare HMO

## 2014-01-26 VITALS — BP 139/82 | HR 97 | Resp 16

## 2014-01-26 DIAGNOSIS — S90122A Contusion of left lesser toe(s) without damage to nail, initial encounter: Secondary | ICD-10-CM

## 2014-01-26 DIAGNOSIS — M2010 Hallux valgus (acquired), unspecified foot: Secondary | ICD-10-CM

## 2014-01-26 DIAGNOSIS — S90129A Contusion of unspecified lesser toe(s) without damage to nail, initial encounter: Secondary | ICD-10-CM

## 2014-01-26 NOTE — Progress Notes (Signed)
   Subjective:    Patient ID: Sue Wood, female    DOB: 1945/10/04, 68 y.o.   MRN: [id number]  HPI Comments: "I hit my nail"  Patient states that she bumped the toenail, 1st toe left, and knocked the nail off about 2 weeks ago. She says that its not sore but its numb around the nail bed. She has been cleaning with peroxide daily and covering with neosporin and band aid.  Toe Pain       Review of Systems  Musculoskeletal: Positive for arthralgias.  All other systems reviewed and are negative.      Objective:   Physical Exam        Assessment & Plan:

## 2014-01-26 NOTE — Progress Notes (Signed)
Subjective:     Patient ID: Sue Wood, female   DOB: 09-Jul-1946, 68 y.o.   MRN: [id number]  HPI patient presents with structural bunion deformity right over left foot it does become painful with shoe gear and a lost toenail left big toe several weeks ago that she has a Band-Aid on. States that she has diabetes and she was concerned but that her last A1c was 7 point0   Review of Systems  All other systems reviewed and are negative.      Objective:   Physical Exam  Nursing note and vitals reviewed. Constitutional: She is oriented to person, place, and time.  Cardiovascular: Intact distal pulses.   Musculoskeletal: Normal range of motion.  Neurological: She is oriented to person, place, and time.  Skin: Skin is warm.   neurovascular status is intact with patient found to have diminished range of motion subtalar midtarsal joint and normal muscle strength. Digits are well-perfused and warm and I noted the left hallux nail has been lost but there is no drainage or indications of infection of the nail bed and I also noted hyperostosis medial aspect first metatarsal head right over left it's painful when pressed     Assessment:     HAV deformity right over left with pain and nailbed has been traumatized left hallux with no issues associated    Plan:     H&P and x-rays reviewed. I do think structural bunion correction would do very well for her and I educated her on this but I said note family physician to confirm that her blood sugars are in normal levels she will continue Band-Aid usage and soaks in the left big toe for the next 1-2 weeks

## 2014-04-10 ENCOUNTER — Ambulatory Visit (INDEPENDENT_AMBULATORY_CARE_PROVIDER_SITE_OTHER): Payer: Medicare HMO | Admitting: Family Medicine

## 2014-04-10 ENCOUNTER — Encounter: Payer: Self-pay | Admitting: Family Medicine

## 2014-04-10 VITALS — BP 128/72 | HR 68 | Temp 98.4°F | Resp 12 | Ht 68.0 in | Wt 214.0 lb

## 2014-04-10 DIAGNOSIS — G589 Mononeuropathy, unspecified: Secondary | ICD-10-CM

## 2014-04-10 DIAGNOSIS — I10 Essential (primary) hypertension: Secondary | ICD-10-CM

## 2014-04-10 DIAGNOSIS — E785 Hyperlipidemia, unspecified: Secondary | ICD-10-CM

## 2014-04-10 DIAGNOSIS — E1149 Type 2 diabetes mellitus with other diabetic neurological complication: Secondary | ICD-10-CM

## 2014-04-10 DIAGNOSIS — E1141 Type 2 diabetes mellitus with diabetic mononeuropathy: Secondary | ICD-10-CM

## 2014-04-10 LAB — BASIC METABOLIC PANEL WITH GFR
BUN: 17 mg/dL (ref 6–23)
CALCIUM: 9.6 mg/dL (ref 8.4–10.5)
CO2: 25 mEq/L (ref 19–32)
CREATININE: 0.94 mg/dL (ref 0.50–1.10)
Chloride: 103 mEq/L (ref 96–112)
GFR, EST NON AFRICAN AMERICAN: 63 mL/min
GFR, Est African American: 73 mL/min
Glucose, Bld: 271 mg/dL — ABNORMAL HIGH (ref 70–99)
Potassium: 5 mEq/L (ref 3.5–5.3)
Sodium: 137 mEq/L (ref 135–145)

## 2014-04-10 LAB — CBC WITH DIFFERENTIAL/PLATELET
BASOS ABS: 0 10*3/uL (ref 0.0–0.1)
Basophils Relative: 1 % (ref 0–1)
EOS ABS: 0.1 10*3/uL (ref 0.0–0.7)
EOS PCT: 3 % (ref 0–5)
HCT: 37.1 % (ref 36.0–46.0)
Hemoglobin: 12.3 g/dL (ref 12.0–15.0)
LYMPHS ABS: 1.6 10*3/uL (ref 0.7–4.0)
LYMPHS PCT: 40 % (ref 12–46)
MCH: 28.1 pg (ref 26.0–34.0)
MCHC: 33.2 g/dL (ref 30.0–36.0)
MCV: 84.7 fL (ref 78.0–100.0)
Monocytes Absolute: 0.2 10*3/uL (ref 0.1–1.0)
Monocytes Relative: 5 % (ref 3–12)
NEUTROS PCT: 51 % (ref 43–77)
Neutro Abs: 2 10*3/uL (ref 1.7–7.7)
PLATELETS: 279 10*3/uL (ref 150–400)
RBC: 4.38 MIL/uL (ref 3.87–5.11)
RDW: 14.7 % (ref 11.5–15.5)
WBC: 4 10*3/uL (ref 4.0–10.5)

## 2014-04-10 LAB — HEMOGLOBIN A1C, FINGERSTICK: HEMOGLOBIN A1C, FINGERSTICK: 10.2 % — AB (ref <5.7)

## 2014-04-10 LAB — LIPID PANEL
CHOLESTEROL: 176 mg/dL (ref 0–200)
HDL: 49 mg/dL (ref >39)
LDL Cholesterol: 108 mg/dL — ABNORMAL HIGH (ref 0–99)
Total CHOL/HDL Ratio: 3.6 Ratio
Triglycerides: 96 mg/dL (ref <150)
VLDL: 19 mg/dL (ref 0–40)

## 2014-04-10 LAB — MICROALBUMIN / CREATININE URINE RATIO
CREATININE, URINE: 68.8 mg/dL
MICROALB UR: 1.37 mg/dL (ref 0.00–1.89)
MICROALB/CREAT RATIO: 19.9 mg/g (ref 0.0–30.0)

## 2014-04-10 MED ORDER — INSULIN DETEMIR 100 UNIT/ML FLEXPEN: PEN_INJECTOR | SUBCUTANEOUS | Status: DC

## 2014-04-10 MED ORDER — METFORMIN HCL 500 MG PO TABS: 1000.0000 mg | ORAL_TABLET | Freq: Two times a day (BID) | ORAL | Status: DC

## 2014-04-10 MED ORDER — ATORVASTATIN CALCIUM 20 MG PO TABS: ORAL_TABLET | ORAL | Status: DC

## 2014-04-10 MED ORDER — AMITRIPTYLINE HCL 50 MG PO TABS: ORAL_TABLET | ORAL | Status: DC

## 2014-04-10 MED ORDER — INSULIN ASPART 100 UNIT/ML FLEXPEN: 18.0000 [IU] | PEN_INJECTOR | Freq: Three times a day (TID) | SUBCUTANEOUS | Status: DC

## 2014-04-10 MED ORDER — AMLODIPINE BESYLATE 10 MG PO TABS: ORAL_TABLET | ORAL | Status: DC

## 2014-04-10 MED ORDER — CANAGLIFLOZIN 100 MG PO TABS: 1.0000 | ORAL_TABLET | Freq: Every day | ORAL | Status: DC

## 2014-04-10 NOTE — Assessment & Plan Note (Signed)
Diabetes is uncontrolled A1c today is actually worse at 10.2 %, she tells me that she has not been following a very good diabetic diet but she has not missed her insulin. I will go ahead and change her Levemir to 60 units and I will have her take NovoLog 20 units with each meal she will followup in 6 weeks and will bring in her meter as well as her log and see what her blood sugars are running if I notice any improvement I will get her to endocrinology

## 2014-04-10 NOTE — Assessment & Plan Note (Signed)
Well controlled, no change to meds, does not tolerate ACEI

## 2014-04-10 NOTE — Patient Instructions (Signed)
Increase levemir to 60 units at bedtime Increase the Novolog to 20 units  Continue all other medications  F/U 6 weeks Bring your meter

## 2014-04-10 NOTE — Progress Notes (Signed)
Patient ID: Sue Wood, female   DOB: [birth year], 68 y.o.   MRN: [id number]   Subjective:    Patient ID: Sue Wood, female    DOB: 1946/04/10, 68 y.o.   MRN: [id number]  Patient presents for 3 month F/U  Patient here follow chronic medical problems. She has no concerns today. She was seen by podiatry recently regarding her bunion however the note states that she would not be a good candidate for surgery. Diabetes mellitus her blood sugars fasting have been in the 140s she did not bring her meter with her today after meals 170s. She's not had any hypoglycemia no polyuria polydipsia. She's taking Levemir 50 units at bedtime she's also taking her metformin and her Invokana.   Medications reviewed   Review Of Systems:  GEN- denies fatigue, fever, weight loss,weakness, recent illness HEENT- denies eye drainage, change in vision, nasal discharge, CVS- denies chest pain, palpitations RESP- denies SOB, cough, wheeze ABD- denies N/V, change in stools, abd pain Neuro- denies headache, dizziness, syncope, seizure activity       Objective:    BP 128/72  Pulse 68  Temp(Src) 98.4 F (36.9 C) (Oral)  Resp 12  Ht 5\' 8"  (1.727 m)  Wt 214 lb (97.07 kg)  BMI 32.55 kg/m2 GEN- NAD, alert and oriented x3 HEENT- PERRL, EOMI, non injected sclera, pink conjunctiva, MMM, oropharynx clear CVS- RRR, no murmur RESP-CTAB EXT- No edema Pulses- Radial, DP- 2+        Assessment & Plan:      Problem List Items Addressed This Visit   Type II or unspecified type diabetes mellitus with neurological manifestations, not stated as uncontrolled(250.60) - Primary   Relevant Orders      CBC with Differential      BASIC METABOLIC PANEL WITH GFR      Hemoglobin A1C, fingerstick      Microalbumin / creatinine urine ratio   Hyperlipidemia   Relevant Orders      Lipid panel   Essential hypertension, benign      Note: This dictation was prepared with Dragon dictation along with smaller phrase  technology. Any transcriptional errors that result from this process are unintentional.

## 2014-04-10 NOTE — Assessment & Plan Note (Signed)
Goal LDL , < 100 on lipitor

## 2014-04-11 ENCOUNTER — Other Ambulatory Visit: Payer: Self-pay | Admitting: *Deleted

## 2014-04-11 MED ORDER — ATORVASTATIN CALCIUM 40 MG PO TABS: ORAL_TABLET | ORAL | Status: DC

## 2014-05-23 ENCOUNTER — Encounter: Payer: Self-pay | Admitting: Family Medicine

## 2014-05-23 ENCOUNTER — Ambulatory Visit (INDEPENDENT_AMBULATORY_CARE_PROVIDER_SITE_OTHER): Payer: Medicare HMO | Admitting: Family Medicine

## 2014-05-23 VITALS — BP 130/76 | HR 88 | Temp 99.1°F | Resp 14 | Ht 67.0 in | Wt 220.0 lb

## 2014-05-23 DIAGNOSIS — Z23 Encounter for immunization: Secondary | ICD-10-CM

## 2014-05-23 DIAGNOSIS — E1149 Type 2 diabetes mellitus with other diabetic neurological complication: Secondary | ICD-10-CM

## 2014-05-23 NOTE — Patient Instructions (Addendum)
Levemir 55 units  Continue Novolog 20 units Continue metformin and Invokana  Flu shot given F/U 2 months- FASTING LABS

## 2014-05-23 NOTE — Assessment & Plan Note (Signed)
Her blood sugars fasting are still on the upper and of note they are much improved in 2 to 300s. I'll have her get a Levemir 55 units she voiced understanding of this we will continue the NovoLog at the current dose we'll continue metformin and the Invokana : Today her A1c less than 8% which are year ago she was at 7.8% Also discussed importance of dietary adherence Flu shot given

## 2014-05-23 NOTE — Progress Notes (Signed)
Patient ID: Sue Wood, female   DOB: May 10, 1946, 68 y.o.   MRN: [id number]   Subjective:    Patient ID: Sue Wood, female    DOB: [birth year], 68 y.o.   MRN: [id number]  Patient presents for 6 week F/U  patient here for interim followup on her diabetes mellitus. Her last visit she was increase her Levemir 16 units however she's been taking 50 units she's also been taking NovoLog 20 units with each meal, metformin 1000 mg twice a day and Invokana 100mg  BID. Her last A1c was 10.2% one month ago. Her fasting blood sugar this morning was 191 she's not having hypoglycemia her 30 day average is 163 her 14 day average 168 before meals she states her blood sugars typically 170 to 180s. She does take her blood sugar 3 times a day   Review Of Systems:  GEN- denies fatigue, fever, weight loss,weakness, recent illness HEENT- denies eye drainage, change in vision, nasal discharge, CVS- denies chest pain, palpitations RESP- denies SOB, cough, wheeze MSK- denies joint pain, muscle aches, injury Neuro- denies headache, dizziness, syncope, seizure activity       Objective:    BP 130/76  Pulse 88  Temp(Src) 99.1 F (37.3 C) (Oral)  Resp 14  Ht 5\' 7"  (1.702 m)  Wt 220 lb (99.791 kg)  BMI 34.45 kg/m2 GEN- NAD, alert and oriented x3        Assessment & Plan:      Problem List Items Addressed This Visit   None    Visit Diagnoses   Need for prophylactic vaccination and inoculation against influenza    -  Primary    Relevant Orders       Flu Vaccine QUAD 36+ mos PF IM (Fluarix Quad PF) (Completed)       Note: This dictation was prepared with Dragon dictation along with smaller phrase technology. Any transcriptional errors that result from this process are unintentional.

## 2014-06-27 ENCOUNTER — Ambulatory Visit (INDEPENDENT_AMBULATORY_CARE_PROVIDER_SITE_OTHER): Payer: Medicare HMO | Admitting: Family Medicine

## 2014-06-27 ENCOUNTER — Encounter: Payer: Self-pay | Admitting: Family Medicine

## 2014-06-27 VITALS — BP 130/78 | HR 64 | Temp 99.0°F | Resp 16 | Ht 67.0 in | Wt 221.0 lb

## 2014-06-27 DIAGNOSIS — M545 Low back pain, unspecified: Secondary | ICD-10-CM

## 2014-06-27 LAB — URINALYSIS, ROUTINE W REFLEX MICROSCOPIC
BILIRUBIN URINE: NEGATIVE
GLUCOSE, UA: NEGATIVE mg/dL
HGB URINE DIPSTICK: NEGATIVE
KETONES UR: NEGATIVE mg/dL
Leukocytes, UA: NEGATIVE
Nitrite: NEGATIVE
PH: 5 (ref 5.0–8.0)
Protein, ur: NEGATIVE mg/dL
SPECIFIC GRAVITY, URINE: 1.015 (ref 1.005–1.030)
Urobilinogen, UA: 0.2 mg/dL (ref 0.0–1.0)

## 2014-06-27 MED ORDER — TRAMADOL HCL 50 MG PO TABS: 50.0000 mg | ORAL_TABLET | Freq: Three times a day (TID) | ORAL | Status: DC | PRN

## 2014-06-27 MED ORDER — NAPROXEN 500 MG PO TABS: 500.0000 mg | ORAL_TABLET | Freq: Two times a day (BID) | ORAL | Status: DC

## 2014-06-27 NOTE — Progress Notes (Signed)
Patient ID: Sue Wood, female   DOB: [birth year], 68 y.o.   MRN: [id number]   Subjective:    Patient ID: Sue Wood, female    DOB: 1946-01-09, 68 y.o.   MRN: [id number]  Patient presents for Back Pain  Patient here with back pain worsened over the past 2 weeks. The pain is mostly on the right side and radiates into the right buttocks. She also has been discomforting to her groin. Initially started on the left side she thought she may be having a bladder infection therefore she did take some over-the-counter AZO in the left side improved. She denies any dysuria currently no change in bowel or bladder. She denies any particular injury to her back however she has been carrying wood into the house for her which does. She's been taking ibuprofen and using a heating pad which helps   Review Of Systems:  GEN- denies fatigue, fever, weight loss,weakness, recent illness HEENT- denies eye drainage, change in vision, nasal discharge, CVS- denies chest pain, palpitations RESP- denies SOB, cough, wheeze ABD- denies N/V, change in stools, abd pain GU- denies dysuria, hematuria, dribbling, incontinence MSK- + joint pain, muscle aches, injury Neuro- denies headache, dizziness, syncope, seizure activity       Objective:    BP 130/78  Pulse 64  Temp(Src) 99 F (37.2 C) (Oral)  Resp 16  Ht 5\' 7"  (1.702 m)  Wt 221 lb (100.245 kg)  BMI 34.61 kg/m2 GEN- NAD, alert and oriented x3 ABD-NABS,soft,NT,ND MSK- TTP right buttocks, neg SLR, pain with flexion and extension lumbar spine, fair ROM Hip Neuro- DTR symmetric, normal tone, antalgic gait/stiff gait , sensation in tact, motor equal bilat LE EXT- No edema Pulses- Radial 2+        Assessment & Plan:      Problem List Items Addressed This Visit   None    Visit Diagnoses   Right-sided low back pain without sciatica    -  Primary    I think this is likely some degenerative disc disease worsened by her picking up heavy wood. We  will try to obtain an x-ray however transportation is an issue for her. I will put her on naprosyn BID and Ultram, continue heating pad    Relevant Medications       naproxen (NAPROSYN) tablet       traMADol (ULTRAM) tablet 50 mg    Other Relevant Orders       DG Lumbar Spine Complete       Urinalysis, Routine w reflex microscopic (Completed)       Note: This dictation was prepared with Dragon dictation along with smaller phrase technology. Any transcriptional errors that result from this process are unintentional.

## 2014-06-27 NOTE — Patient Instructions (Signed)
Take naprosyn twice a day with food Stop the ibuprofen Ultram as needed for pain Get the xray

## 2014-06-28 ENCOUNTER — Ambulatory Visit (HOSPITAL_COMMUNITY)
Admission: RE | Admit: 2014-06-28 | Discharge: 2014-06-28 | Disposition: A | Discharge status: Home / Self Care | Payer: Commercial Managed Care - HMO | Source: Ambulatory Visit | Attending: Family Medicine | Admitting: Family Medicine

## 2014-06-28 DIAGNOSIS — M545 Low back pain, unspecified: Secondary | ICD-10-CM

## 2014-07-24 ENCOUNTER — Ambulatory Visit (INDEPENDENT_AMBULATORY_CARE_PROVIDER_SITE_OTHER): Payer: Medicare HMO | Admitting: Family Medicine

## 2014-07-24 ENCOUNTER — Encounter: Payer: Self-pay | Admitting: Family Medicine

## 2014-07-24 VITALS — BP 132/78 | HR 78 | Temp 99.1°F | Resp 14 | Ht 67.0 in | Wt 223.0 lb

## 2014-07-24 DIAGNOSIS — E1143 Type 2 diabetes mellitus with diabetic autonomic (poly)neuropathy: Secondary | ICD-10-CM

## 2014-07-24 DIAGNOSIS — E785 Hyperlipidemia, unspecified: Secondary | ICD-10-CM

## 2014-07-24 DIAGNOSIS — E669 Obesity, unspecified: Secondary | ICD-10-CM

## 2014-07-24 DIAGNOSIS — I10 Essential (primary) hypertension: Secondary | ICD-10-CM

## 2014-07-24 LAB — LIPID PANEL
CHOL/HDL RATIO: 3.7 ratio
CHOLESTEROL: 189 mg/dL (ref 0–200)
HDL: 51 mg/dL (ref >39)
LDL CALC: 120 mg/dL — AB (ref 0–99)
TRIGLYCERIDES: 91 mg/dL (ref <150)
VLDL: 18 mg/dL (ref 0–40)

## 2014-07-24 LAB — COMPREHENSIVE METABOLIC PANEL
ALK PHOS: 94 U/L (ref 39–117)
ALT: 16 U/L (ref 0–35)
AST: 14 U/L (ref 0–37)
Albumin: 4 g/dL (ref 3.5–5.2)
BILIRUBIN TOTAL: 0.4 mg/dL (ref 0.2–1.2)
BUN: 13 mg/dL (ref 6–23)
CO2: 25 mEq/L (ref 19–32)
CREATININE: 0.91 mg/dL (ref 0.50–1.10)
Calcium: 9.4 mg/dL (ref 8.4–10.5)
Chloride: 98 mEq/L (ref 96–112)
Glucose, Bld: 250 mg/dL — ABNORMAL HIGH (ref 70–99)
Potassium: 4.7 mEq/L (ref 3.5–5.3)
Sodium: 135 mEq/L (ref 135–145)
Total Protein: 7.5 g/dL (ref 6.0–8.3)

## 2014-07-24 LAB — CBC WITH DIFFERENTIAL/PLATELET
Basophils Absolute: 0 10*3/uL (ref 0.0–0.1)
Basophils Relative: 0 % (ref 0–1)
EOS ABS: 0.2 10*3/uL (ref 0.0–0.7)
EOS PCT: 3 % (ref 0–5)
HEMATOCRIT: 34.9 % — AB (ref 36.0–46.0)
HEMOGLOBIN: 11.7 g/dL — AB (ref 12.0–15.0)
Lymphocytes Relative: 37 % (ref 12–46)
Lymphs Abs: 1.9 10*3/uL (ref 0.7–4.0)
MCH: 29 pg (ref 26.0–34.0)
MCHC: 33.5 g/dL (ref 30.0–36.0)
MCV: 86.6 fL (ref 78.0–100.0)
MONOS PCT: 5 % (ref 3–12)
Monocytes Absolute: 0.3 10*3/uL (ref 0.1–1.0)
Neutro Abs: 2.8 10*3/uL (ref 1.7–7.7)
Neutrophils Relative %: 55 % (ref 43–77)
Platelets: 241 10*3/uL (ref 150–400)
RBC: 4.03 MIL/uL (ref 3.87–5.11)
RDW: 14.8 % (ref 11.5–15.5)
WBC: 5 10*3/uL (ref 4.0–10.5)

## 2014-07-24 LAB — HEMOGLOBIN A1C
Hgb A1c MFr Bld: 8.4 % — ABNORMAL HIGH (ref <5.7)
Mean Plasma Glucose: 194 mg/dL — ABNORMAL HIGH (ref <117)

## 2014-07-24 NOTE — Progress Notes (Signed)
Patient ID: Sue Wood, female   DOB: 1945-09-12, 68 y.o.   MRN: [id number]   Subjective:    Patient ID: Sue Wood, female    DOB: [birth year], 68 y.o.   MRN: [id number]  Patient presents for 2 month F/U patient here to follow-up chronic medical problems. She did not bring her meter with her blood sugars from 150s to 200s. Sometimes she misses her NovoLog shot is sleeping on Sundays. She denies any hypoglycemia symptoms. He has no other concerns today.    Review Of Systems:  GEN- denies fatigue, fever, weight loss,weakness, recent illness HEENT- denies eye drainage, change in vision, nasal discharge, CVS- denies chest pain, palpitations RESP- denies SOB, cough, wheeze ABD- denies N/V, change in stools, abd pain GU- denies dysuria, hematuria, dribbling, incontinence MSK- denies joint pain, muscle aches, injury Neuro- denies headache, dizziness, syncope, seizure activity       Objective:    BP 132/78 mmHg  Pulse 78  Temp(Src) 99.1 F (37.3 C) (Oral)  Resp 14  Ht 5\' 7"  (1.702 m)  Wt 223 lb (101.152 kg)  BMI 34.92 kg/m2 GEN- NAD, alert and oriented x3 HEENT- PERRL, EOMI, non injected sclera, pink conjunctiva, MMM, oropharynx clear CVS- RRR, no murmur RESP-CTAB EXT- trace pedal edema Pulses- Radial 2+        Assessment & Plan:      Problem List Items Addressed This Visit    Hyperlipidemia   Relevant Orders      Lipid panel   Essential hypertension, benign - Primary   Relevant Orders      CBC with Differential      Comprehensive metabolic panel   Diabetes mellitus with neurological manifestation   Relevant Orders      Hemoglobin A1c      Note: This dictation was prepared with Dragon dictation along with smaller phrase technology. Any transcriptional errors that result from this process are unintentional.

## 2014-07-24 NOTE — Assessment & Plan Note (Signed)
Recheck LDL on increased dose of statin

## 2014-07-24 NOTE — Assessment & Plan Note (Signed)
Well controlled no changes 

## 2014-07-24 NOTE — Patient Instructions (Signed)
We will call with lab results  Continue current medications for now  F/U 3 months

## 2014-07-24 NOTE — Assessment & Plan Note (Signed)
Recheck A1C, goal is to get down below 8% Will adjust insulin discussed taking Novolog on regular basis Discussed dietary habits

## 2014-07-25 ENCOUNTER — Other Ambulatory Visit: Payer: Self-pay | Admitting: *Deleted

## 2014-07-25 MED ORDER — ATORVASTATIN CALCIUM 80 MG PO TABS: ORAL_TABLET | ORAL | Status: DC

## 2014-10-10 ENCOUNTER — Other Ambulatory Visit: Payer: Self-pay | Admitting: Family Medicine

## 2014-10-11 NOTE — Telephone Encounter (Signed)
Refill appropriate and filled per protocol. 

## 2014-10-31 ENCOUNTER — Ambulatory Visit (INDEPENDENT_AMBULATORY_CARE_PROVIDER_SITE_OTHER): Payer: Medicare HMO | Admitting: Family Medicine

## 2014-10-31 ENCOUNTER — Encounter: Payer: Self-pay | Admitting: Family Medicine

## 2014-10-31 VITALS — BP 124/76 | HR 68 | Temp 98.2°F | Resp 16 | Ht 67.0 in | Wt 225.0 lb

## 2014-10-31 DIAGNOSIS — E785 Hyperlipidemia, unspecified: Secondary | ICD-10-CM | POA: Diagnosis not present

## 2014-10-31 DIAGNOSIS — G990 Autonomic neuropathy in diseases classified elsewhere: Secondary | ICD-10-CM | POA: Diagnosis not present

## 2014-10-31 DIAGNOSIS — E1141 Type 2 diabetes mellitus with diabetic mononeuropathy: Secondary | ICD-10-CM

## 2014-10-31 DIAGNOSIS — E669 Obesity, unspecified: Secondary | ICD-10-CM

## 2014-10-31 DIAGNOSIS — I10 Essential (primary) hypertension: Secondary | ICD-10-CM | POA: Diagnosis not present

## 2014-10-31 DIAGNOSIS — E1143 Type 2 diabetes mellitus with diabetic autonomic (poly)neuropathy: Secondary | ICD-10-CM

## 2014-10-31 LAB — COMPREHENSIVE METABOLIC PANEL
ALK PHOS: 120 U/L — AB (ref 39–117)
ALT: 17 U/L (ref 0–35)
AST: 15 U/L (ref 0–37)
Albumin: 4.1 g/dL (ref 3.5–5.2)
BILIRUBIN TOTAL: 0.4 mg/dL (ref 0.2–1.2)
BUN: 12 mg/dL (ref 6–23)
CO2: 24 mEq/L (ref 19–32)
Calcium: 9.4 mg/dL (ref 8.4–10.5)
Chloride: 103 mEq/L (ref 96–112)
Creat: 0.88 mg/dL (ref 0.50–1.10)
Glucose, Bld: 259 mg/dL — ABNORMAL HIGH (ref 70–99)
Potassium: 4.3 mEq/L (ref 3.5–5.3)
Sodium: 136 mEq/L (ref 135–145)
TOTAL PROTEIN: 7.8 g/dL (ref 6.0–8.3)

## 2014-10-31 LAB — CBC WITH DIFFERENTIAL/PLATELET
BASOS PCT: 0 % (ref 0–1)
Basophils Absolute: 0 10*3/uL (ref 0.0–0.1)
EOS ABS: 0.1 10*3/uL (ref 0.0–0.7)
EOS PCT: 3 % (ref 0–5)
HEMATOCRIT: 36.7 % (ref 36.0–46.0)
Hemoglobin: 12.2 g/dL (ref 12.0–15.0)
LYMPHS ABS: 1.8 10*3/uL (ref 0.7–4.0)
Lymphocytes Relative: 37 % (ref 12–46)
MCH: 28.8 pg (ref 26.0–34.0)
MCHC: 33.2 g/dL (ref 30.0–36.0)
MCV: 86.8 fL (ref 78.0–100.0)
MONOS PCT: 5 % (ref 3–12)
MPV: 11 fL (ref 8.6–12.4)
Monocytes Absolute: 0.2 10*3/uL (ref 0.1–1.0)
Neutro Abs: 2.6 10*3/uL (ref 1.7–7.7)
Neutrophils Relative %: 55 % (ref 43–77)
Platelets: 281 10*3/uL (ref 150–400)
RBC: 4.23 MIL/uL (ref 3.87–5.11)
RDW: 14.3 % (ref 11.5–15.5)
WBC: 4.8 10*3/uL (ref 4.0–10.5)

## 2014-10-31 LAB — HEMOGLOBIN A1C
Hgb A1c MFr Bld: 10.7 % — ABNORMAL HIGH (ref <5.7)
MEAN PLASMA GLUCOSE: 260 mg/dL — AB (ref <117)

## 2014-10-31 LAB — LIPID PANEL
CHOL/HDL RATIO: 3.2 ratio
CHOLESTEROL: 173 mg/dL (ref 0–200)
HDL: 54 mg/dL (ref >=46)
LDL Cholesterol: 97 mg/dL (ref 0–99)
Triglycerides: 108 mg/dL (ref <150)
VLDL: 22 mg/dL (ref 0–40)

## 2014-10-31 NOTE — Assessment & Plan Note (Signed)
Well controlled, no ACE due to angioedema

## 2014-10-31 NOTE — Assessment & Plan Note (Signed)
Uncontrolled, discussed medication adherence, diet changes Goal A1C < 8%  Levemir 55 units, novolog 18 units, continue MTF

## 2014-10-31 NOTE — Progress Notes (Signed)
Patient ID: Sue Wood, female   DOB: Jul 17, 1946, 69 y.o.   MRN: [id number]   Subjective:    Patient ID: Sue Wood, female    DOB: 06-17-1946, 69 y.o.   MRN: [id number]  Patient presents for 3 month F/U  patient here to follow-up chronic medical problems. Diabetes mellitus her last A1c was 8.5% which was an improvement from 10.2%. She's only been injecting Levemir 15 units she states that she thought her blood sugars were doing good at her last visit I advised her to take 55 units. She is also giving NovoLog 18 units with each meal. Her fasting blood sugars are still 170-200 per preprandial blood sugars are 180-225. She did not bring her meter with her today. She denies any hypoglycemia symptoms. She states she has had sugars into the 300s when she forgets to give her mealtime insulin.    Review Of Systems:  GEN- denies fatigue, fever, weight loss,weakness, recent illness HEENT- denies eye drainage, change in vision, nasal discharge, CVS- denies chest pain, palpitations RESP- denies SOB, cough, wheeze ABD- denies N/V, change in stools, abd pain GU- denies dysuria, hematuria, dribbling, incontinence MSK- +joint pain, muscle aches, injury Neuro- denies headache, dizziness, syncope, seizure activity       Objective:    BP 124/76 mmHg  Pulse 68  Temp(Src) 98.2 F (36.8 C) (Oral)  Resp 16  Ht 5\' 7"  (1.702 m)  Wt 225 lb (102.059 kg)  BMI 35.23 kg/m2 GEN- NAD, alert and oriented x3 HEENT- PERRL, EOMI, non injected sclera, pink conjunctiva, MMM, oropharynx clear CVS- RRR, no murmur RESP-CTAB EXT- trace left pedal edema Pulses- Radial, DP- 2+        Assessment & Plan:      Problem List Items Addressed This Visit      Unprioritized   Obesity   Hyperlipidemia   Relevant Orders   Lipid panel   Essential hypertension, benign   Diabetic neuropathy   Diabetes mellitus with neurological manifestation - Primary   Relevant Orders   HM DIABETES FOOT EXAM  (Completed)   CBC with Differential/Platelet   Comprehensive metabolic panel   Hemoglobin A1c      Note: This dictation was prepared with Dragon dictation along with smaller phrase technology. Any transcriptional errors that result from this process are unintentional.

## 2014-10-31 NOTE — Assessment & Plan Note (Signed)
lipitor maxed out 3 months ago, recheck to day, may need to switch to crestor

## 2014-10-31 NOTE — Patient Instructions (Signed)
Continue current meds Give 55 units of Levemir Continue 18 units of Novolog We will call with lab results F/U 3 months

## 2014-11-08 ENCOUNTER — Other Ambulatory Visit: Payer: Self-pay | Admitting: Family Medicine

## 2014-11-08 NOTE — Telephone Encounter (Signed)
Medication refilled per protocol. 

## 2014-12-07 ENCOUNTER — Other Ambulatory Visit: Payer: Self-pay | Admitting: Family Medicine

## 2014-12-07 NOTE — Telephone Encounter (Signed)
Refill appropriate and filled per protocol. 

## 2015-02-02 ENCOUNTER — Ambulatory Visit: Payer: Commercial Managed Care - HMO | Admitting: Family Medicine

## 2015-02-06 ENCOUNTER — Encounter: Payer: Self-pay | Admitting: Family Medicine

## 2015-02-06 ENCOUNTER — Ambulatory Visit (INDEPENDENT_AMBULATORY_CARE_PROVIDER_SITE_OTHER): Payer: Commercial Managed Care - HMO | Admitting: Family Medicine

## 2015-02-06 VITALS — BP 126/80 | HR 82 | Temp 98.8°F | Resp 14 | Ht 67.0 in | Wt 224.0 lb

## 2015-02-06 DIAGNOSIS — I10 Essential (primary) hypertension: Secondary | ICD-10-CM

## 2015-02-06 DIAGNOSIS — E1141 Type 2 diabetes mellitus with diabetic mononeuropathy: Secondary | ICD-10-CM | POA: Diagnosis not present

## 2015-02-06 DIAGNOSIS — Z23 Encounter for immunization: Secondary | ICD-10-CM

## 2015-02-06 DIAGNOSIS — G990 Autonomic neuropathy in diseases classified elsewhere: Secondary | ICD-10-CM | POA: Diagnosis not present

## 2015-02-06 DIAGNOSIS — E669 Obesity, unspecified: Secondary | ICD-10-CM | POA: Diagnosis not present

## 2015-02-06 DIAGNOSIS — E785 Hyperlipidemia, unspecified: Secondary | ICD-10-CM | POA: Diagnosis not present

## 2015-02-06 DIAGNOSIS — G589 Mononeuropathy, unspecified: Secondary | ICD-10-CM | POA: Diagnosis not present

## 2015-02-06 DIAGNOSIS — E1143 Type 2 diabetes mellitus with diabetic autonomic (poly)neuropathy: Secondary | ICD-10-CM | POA: Diagnosis not present

## 2015-02-06 LAB — COMPREHENSIVE METABOLIC PANEL
ALK PHOS: 112 U/L (ref 39–117)
ALT: 15 U/L (ref 0–35)
AST: 14 U/L (ref 0–37)
Albumin: 4 g/dL (ref 3.5–5.2)
BILIRUBIN TOTAL: 0.3 mg/dL (ref 0.2–1.2)
BUN: 13 mg/dL (ref 6–23)
CO2: 26 mEq/L (ref 19–32)
Calcium: 9.2 mg/dL (ref 8.4–10.5)
Chloride: 103 mEq/L (ref 96–112)
Creat: 0.87 mg/dL (ref 0.50–1.10)
Glucose, Bld: 196 mg/dL — ABNORMAL HIGH (ref 70–99)
Potassium: 4.4 mEq/L (ref 3.5–5.3)
SODIUM: 138 meq/L (ref 135–145)
TOTAL PROTEIN: 7.2 g/dL (ref 6.0–8.3)

## 2015-02-06 LAB — CBC WITH DIFFERENTIAL/PLATELET
BASOS ABS: 0 10*3/uL (ref 0.0–0.1)
Basophils Relative: 0 % (ref 0–1)
EOS PCT: 3 % (ref 0–5)
Eosinophils Absolute: 0.2 10*3/uL (ref 0.0–0.7)
HCT: 36.6 % (ref 36.0–46.0)
HEMOGLOBIN: 12.1 g/dL (ref 12.0–15.0)
Lymphocytes Relative: 38 % (ref 12–46)
Lymphs Abs: 1.9 10*3/uL (ref 0.7–4.0)
MCH: 28.3 pg (ref 26.0–34.0)
MCHC: 33.1 g/dL (ref 30.0–36.0)
MCV: 85.7 fL (ref 78.0–100.0)
MPV: 11 fL (ref 8.6–12.4)
Monocytes Absolute: 0.3 10*3/uL (ref 0.1–1.0)
Monocytes Relative: 5 % (ref 3–12)
NEUTROS ABS: 2.8 10*3/uL (ref 1.7–7.7)
Neutrophils Relative %: 54 % (ref 43–77)
Platelets: 290 10*3/uL (ref 150–400)
RBC: 4.27 MIL/uL (ref 3.87–5.11)
RDW: 15 % (ref 11.5–15.5)
WBC: 5.1 10*3/uL (ref 4.0–10.5)

## 2015-02-06 LAB — LIPID PANEL
CHOLESTEROL: 174 mg/dL (ref 0–200)
HDL: 47 mg/dL (ref >=46)
LDL CALC: 110 mg/dL — AB (ref 0–99)
Total CHOL/HDL Ratio: 3.7 Ratio
Triglycerides: 87 mg/dL (ref <150)
VLDL: 17 mg/dL (ref 0–40)

## 2015-02-06 NOTE — Assessment & Plan Note (Signed)
Blood pressure well controlled, no change to meds 

## 2015-02-06 NOTE — Patient Instructions (Signed)
Pneumonia shot given Continue current medications Work on low sugar diet-  We will call with lab results F/U 3 months  For PHYSICAL

## 2015-02-06 NOTE — Assessment & Plan Note (Signed)
Uncontrolled, goal A1C < 8%  Continue insulin therapy with levemir and Novolog, Metformin On STatin, ASA,

## 2015-02-06 NOTE — Assessment & Plan Note (Signed)
Continue to encourage healthy eating, no signigicant weight change over past year

## 2015-02-06 NOTE — Assessment & Plan Note (Signed)
On Lipitor as prescribed

## 2015-02-06 NOTE — Progress Notes (Signed)
Patient ID: Sue Wood, female   DOB: 1945/12/03, 69 y.o.   MRN: [id number]   Subjective:    Patient ID: Sue Wood, female    DOB: 08/08/1946, 69 y.o.   MRN: [id number]  Patient presents for 3 month F/U  Pt here to f/u patient here to follow-up diabetes mellitus. She has no concerns. She did not bring her meter with her today. She states her fastings are 130s. She denies any hypoglycemia since the high 50s. She is taking Levemir 60 units and NovoLog 18 units with meals. She denies skipping any meds She is also taking all her other medicines as prescribed. Agrees to pneumonia vaccine today Denies polyuria, polydipsia   Review Of Systems:  GEN- denies fatigue, fever, weight loss,weakness, recent illness HEENT- denies eye drainage, change in vision, nasal discharge, CVS- denies chest pain, palpitations RESP- denies SOB, cough, wheeze ABD- denies N/V, change in stools, abd pain GU- denies dysuria, hematuria, dribbling, incontinence MSK- denies joint pain, muscle aches, injury Neuro- denies headache, dizziness, syncope, seizure activity       Objective:    BP 126/80 mmHg  Pulse 82  Temp(Src) 98.8 F (37.1 C) (Oral)  Resp 14  Ht 5\' 7"  (1.702 m)  Wt 224 lb (101.606 kg)  BMI 35.08 kg/m2 GEN- NAD, alert and oriented x3 HEENT- PERRL, EOMI, non injected sclera, pink conjunctiva, MMM, oropharynx clear Neck- Supple, no thyromegaly CVS- RRR, no murmur RESP-CTAB EXT- trace pedal edema Pulses- Radial, DP- 2+        Assessment & Plan:      Problem List Items Addressed This Visit    Obesity    Continue to encourage healthy eating, no signigicant weight change over past year      Hyperlipidemia    On Lipitor as prescribed      Essential hypertension, benign - Primary    Blood pressure well controlled , no change to meds      Relevant Orders   CBC with Differential/Platelet   Comprehensive metabolic panel   Diabetic neuropathy   Relevant Orders   Lipid  panel   Diabetes mellitus with neurological manifestation    Uncontrolled, goal A1C < 8%  Continue insulin therapy with levemir and Novolog, Metformin On STatin, ASA,       Relevant Orders   Hemoglobin A1c    Other Visit Diagnoses    Need for prophylactic vaccination against Streptococcus pneumoniae (pneumococcus)           Note: This dictation was prepared with Dragon dictation along with smaller phrase technology. Any transcriptional errors that result from this process are unintentional.

## 2015-02-07 ENCOUNTER — Encounter: Payer: Self-pay | Admitting: *Deleted

## 2015-02-07 LAB — HEMOGLOBIN A1C
HEMOGLOBIN A1C: 8.4 % — AB (ref <5.7)
Mean Plasma Glucose: 194 mg/dL — ABNORMAL HIGH (ref <117)

## 2015-02-09 ENCOUNTER — Other Ambulatory Visit: Payer: Self-pay | Admitting: Family Medicine

## 2015-02-12 NOTE — Telephone Encounter (Signed)
Refill appropriate and filled per protocol. 

## 2015-03-08 ENCOUNTER — Other Ambulatory Visit: Payer: Self-pay | Admitting: Family Medicine

## 2015-03-08 NOTE — Telephone Encounter (Signed)
Medication refilled per protocol. 

## 2015-04-19 ENCOUNTER — Other Ambulatory Visit: Payer: Self-pay | Admitting: Family Medicine

## 2015-04-19 MED ORDER — ATORVASTATIN CALCIUM 80 MG PO TABS: 80.0000 mg | ORAL_TABLET | Freq: Every day | ORAL | Status: DC

## 2015-04-19 NOTE — Telephone Encounter (Signed)
Medication refilled per protocol. 

## 2015-05-09 ENCOUNTER — Ambulatory Visit (INDEPENDENT_AMBULATORY_CARE_PROVIDER_SITE_OTHER): Payer: Commercial Managed Care - HMO | Admitting: Family Medicine

## 2015-05-09 ENCOUNTER — Encounter: Payer: Self-pay | Admitting: Family Medicine

## 2015-05-09 VITALS — BP 138/78 | HR 84 | Temp 98.6°F | Resp 12 | Ht 67.0 in | Wt 228.0 lb

## 2015-05-09 DIAGNOSIS — E669 Obesity, unspecified: Secondary | ICD-10-CM

## 2015-05-09 DIAGNOSIS — Z13820 Encounter for screening for osteoporosis: Secondary | ICD-10-CM

## 2015-05-09 DIAGNOSIS — E1143 Type 2 diabetes mellitus with diabetic autonomic (poly)neuropathy: Secondary | ICD-10-CM

## 2015-05-09 DIAGNOSIS — Z0000 Encounter for general adult medical examination without abnormal findings: Secondary | ICD-10-CM

## 2015-05-09 DIAGNOSIS — I10 Essential (primary) hypertension: Secondary | ICD-10-CM | POA: Diagnosis not present

## 2015-05-09 DIAGNOSIS — E1141 Type 2 diabetes mellitus with diabetic mononeuropathy: Secondary | ICD-10-CM | POA: Diagnosis not present

## 2015-05-09 DIAGNOSIS — Z23 Encounter for immunization: Secondary | ICD-10-CM | POA: Diagnosis not present

## 2015-05-09 DIAGNOSIS — E785 Hyperlipidemia, unspecified: Secondary | ICD-10-CM

## 2015-05-09 DIAGNOSIS — G990 Autonomic neuropathy in diseases classified elsewhere: Secondary | ICD-10-CM | POA: Diagnosis not present

## 2015-05-09 DIAGNOSIS — Z1239 Encounter for other screening for malignant neoplasm of breast: Secondary | ICD-10-CM

## 2015-05-09 LAB — COMPREHENSIVE METABOLIC PANEL
ALK PHOS: 120 U/L (ref 33–130)
ALT: 18 U/L (ref 6–29)
AST: 16 U/L (ref 10–35)
Albumin: 4.4 g/dL (ref 3.6–5.1)
BUN: 18 mg/dL (ref 7–25)
CALCIUM: 9.5 mg/dL (ref 8.6–10.4)
CHLORIDE: 101 mmol/L (ref 98–110)
CO2: 22 mmol/L (ref 20–31)
Creat: 0.79 mg/dL (ref 0.50–0.99)
Glucose, Bld: 224 mg/dL — ABNORMAL HIGH (ref 70–99)
POTASSIUM: 4.3 mmol/L (ref 3.5–5.3)
Sodium: 136 mmol/L (ref 135–146)
Total Bilirubin: 0.3 mg/dL (ref 0.2–1.2)
Total Protein: 7.5 g/dL (ref 6.1–8.1)

## 2015-05-09 LAB — LIPID PANEL
CHOLESTEROL: 160 mg/dL (ref 125–200)
HDL: 42 mg/dL — AB (ref >=46)
LDL Cholesterol: 96 mg/dL (ref <130)
Total CHOL/HDL Ratio: 3.8 Ratio (ref <=5.0)
Triglycerides: 110 mg/dL (ref <150)
VLDL: 22 mg/dL (ref <30)

## 2015-05-09 LAB — HEMOGLOBIN A1C
HEMOGLOBIN A1C: 8.1 % — AB (ref <5.7)
MEAN PLASMA GLUCOSE: 186 mg/dL — AB (ref <117)

## 2015-05-09 MED ORDER — METFORMIN HCL 500 MG PO TABS: 1000.0000 mg | ORAL_TABLET | Freq: Two times a day (BID) | ORAL | Status: DC

## 2015-05-09 MED ORDER — INSULIN DETEMIR 100 UNIT/ML FLEXPEN: PEN_INJECTOR | SUBCUTANEOUS | Status: DC

## 2015-05-09 MED ORDER — ATORVASTATIN CALCIUM 80 MG PO TABS: 80.0000 mg | ORAL_TABLET | Freq: Every day | ORAL | Status: DC

## 2015-05-09 MED ORDER — AMLODIPINE BESYLATE 10 MG PO TABS
10.0000 mg | ORAL_TABLET | Freq: Every day | ORAL | Status: DC
Start: 2015-05-09 — End: 2015-08-08

## 2015-05-09 MED ORDER — INSULIN ASPART 100 UNIT/ML FLEXPEN: PEN_INJECTOR | SUBCUTANEOUS | Status: DC

## 2015-05-09 MED ORDER — NAPROXEN 500 MG PO TABS: 500.0000 mg | ORAL_TABLET | Freq: Two times a day (BID) | ORAL | Status: DC

## 2015-05-09 MED ORDER — AMITRIPTYLINE HCL 50 MG PO TABS: 50.0000 mg | ORAL_TABLET | Freq: Every day | ORAL | Status: DC

## 2015-05-09 NOTE — Patient Instructions (Signed)
Continue current medications Schedule Mammogram and Bone Density  Get an Eye exam Flu shot today We will call with  Lab results F/U 3 Months

## 2015-05-09 NOTE — Progress Notes (Signed)
Patient ID: Sue Wood, female   DOB: 06/08/1946, 69 y.o.   MRN: [id number] Subjective:   Patient presents for Medicare Annual/Subsequent preventive examination.   Patient here for Medicare wellness exam. She is no particular concerns today. She did not bring her medications or her glucometer with her. Diabetes mellitus her last A1c was 8.4% she is giving Levemir 60 units and 20 units of NovoLog last night her blood sugar was 215 but states her fastings have been less than 200. She is still having some trouble following a diabetic diet and has gained 4 pounds at her last visit. She denies any hypoglycemia episodes.  Review Past Medical/Family/Social:  s/p Hysteretomy - NO PAP   Risk Factors  Current exercise habits: Walks some Dietary issues discussed: Yes  Cardiac risk factors: Obesity (BMI >= 30 kg/m2). HTN, DM, Hyperlipidemia  Depression Screen  (Note: if answer to either of the following is "Yes", a more complete depression screening is indicated)  Over the past two weeks, have you felt down, depressed or hopeless? No Over the past two weeks, have you felt little interest or pleasure in doing things? No Have you lost interest or pleasure in daily life? No Do you often feel hopeless? No Do you cry easily over simple problems? No   Activities of Daily Living  In your present state of health, do you have any difficulty performing the following activities?:  Driving? No - Does not drive  Managing money? No  Feeding yourself? No  Getting from bed to chair? No  Climbing a flight of stairs? No  Preparing food and eating?: No  Bathing or showering? No  Getting dressed: No  Getting to the toilet? No  Using the toilet:No  Moving around from place to place: No  In the past year have you fallen or had a near fall?:No  Are you sexually active? No  Do you have more than one partner? No   Hearing Difficulties: No  Do you often ask people to speak up or repeat themselves? No  Do  you experience ringing or noises in your ears? No Do you have difficulty understanding soft or whispered voices? No  Do you feel that you have a problem with memory? No Do you often misplace items? No  Do you feel safe at home? Yes  Cognitive Testing  Alert? Yes Normal Appearance?Yes  Oriented to person? Yes Place? Yes  Time? Yes  Recall of three objects? Yes  Can perform simple calculations? Yes  Displays appropriate judgment?Yes  Can read the correct time from a watch face?Yes   List the Names of Other Physician/Practitioners you currently use:   - GI   Screening Tests / Date Colonoscopy      - UTD               Zostavax  - Unable to afford Mammogram - Due Influenza Vaccine - Due Tetanus/tdap- UTD  ROS: GEN- denies fatigue, fever, weight loss,weakness, recent illness HEENT- denies eye drainage, change in vision, nasal discharge, CVS- denies chest pain, palpitations RESP- denies SOB, cough, wheeze ABD- denies N/V, change in stools, abd pain GU- denies dysuria, hematuria, dribbling, incontinence MSK- denies joint pain, muscle aches, injury Neuro- denies headache, dizziness, syncope, seizure activity   Physical: GEN- NAD, alert and oriented x3 HEENT- PERRL, EOMI, non injected sclera, pink conjunctiva, MMM, oropharynx clear Neck- Supple, no thryomegaly CVS- RRR, no murmur RESP-CTAB EXT- No edema Pulses- Radial, DP- 2+   Assessment:    Annual  wellness medicare exam   Plan:    During the course of the visit the patient was educated and counseled about appropriate screening and preventive services including:  Screening mammography  Bone Density-  Pt to schedule both mammo/bone density and eye exam  Flu shot given  Screen Neg  for depression.  Diet review for nutrition referral? Yes ____ Not Indicated __x__  - discussed in detail no transportation to nutritontionist Patient Instructions (the written plan) was given to the patient.  Medicare Attestation  I have  personally reviewed:  The patient's medical and social history  Their use of alcohol, tobacco or illicit drugs  Their current medications and supplements  The patient's functional ability including ADLs,fall risks, home safety risks, cognitive, and hearing and visual impairment  Diet and physical activities  Evidence for depression or mood disorders  The patient's weight, height, BMI, and visual acuity have been recorded in the chart. I have made referrals, counseling, and provided education to the patient based on review of the above and I have provided the patient with a written personalized care plan for preventive services.

## 2015-05-09 NOTE — Assessment & Plan Note (Signed)
Well controlled, no change to meds 

## 2015-05-09 NOTE — Assessment & Plan Note (Signed)
Diabetes uncontrolled with neuropathy, She would benefit siginificantly if we can get her A1C below 8% and get her to stick with diabetic diet Elavil for neuropathy On ASA, ACEI, STATIN

## 2015-06-07 ENCOUNTER — Telehealth: Payer: Self-pay | Admitting: *Deleted

## 2015-06-07 NOTE — Telephone Encounter (Signed)
Patient has appt scheduled at [facility] on Oct 13th at 10:30 for Mammogram and Bone Density, pt is not to wear any lotions, powders or deodorant along with no Ca Supplements for 48 hrs before her appointment. Tried calling pt no answer at this time.

## 2015-06-13 ENCOUNTER — Encounter: Payer: Self-pay | Admitting: *Deleted

## 2015-06-13 NOTE — Telephone Encounter (Signed)
Sent letter to pt with appointment information

## 2015-06-21 ENCOUNTER — Ambulatory Visit (HOSPITAL_COMMUNITY)
Admission: RE | Admit: 2015-06-21 | Discharge: 2015-06-21 | Disposition: A | Discharge status: Home / Self Care | Payer: Commercial Managed Care - HMO | Source: Ambulatory Visit | Attending: Family Medicine | Admitting: Family Medicine

## 2015-06-21 ENCOUNTER — Other Ambulatory Visit: Payer: Self-pay | Admitting: Family Medicine

## 2015-06-21 DIAGNOSIS — Z13820 Encounter for screening for osteoporosis: Secondary | ICD-10-CM

## 2015-06-21 DIAGNOSIS — M810 Age-related osteoporosis without current pathological fracture: Secondary | ICD-10-CM

## 2015-06-21 DIAGNOSIS — Z780 Asymptomatic menopausal state: Secondary | ICD-10-CM | POA: Insufficient documentation

## 2015-06-21 DIAGNOSIS — Z794 Long term (current) use of insulin: Secondary | ICD-10-CM | POA: Diagnosis not present

## 2015-06-21 DIAGNOSIS — Z1239 Encounter for other screening for malignant neoplasm of breast: Secondary | ICD-10-CM

## 2015-06-21 DIAGNOSIS — Z1231 Encounter for screening mammogram for malignant neoplasm of breast: Secondary | ICD-10-CM | POA: Diagnosis not present

## 2015-06-21 DIAGNOSIS — Z7982 Long term (current) use of aspirin: Secondary | ICD-10-CM | POA: Insufficient documentation

## 2015-06-21 DIAGNOSIS — M818 Other osteoporosis without current pathological fracture: Secondary | ICD-10-CM | POA: Diagnosis not present

## 2015-06-21 DIAGNOSIS — E119 Type 2 diabetes mellitus without complications: Secondary | ICD-10-CM | POA: Diagnosis not present

## 2015-06-25 ENCOUNTER — Other Ambulatory Visit: Payer: Self-pay | Admitting: Family Medicine

## 2015-06-25 DIAGNOSIS — R928 Other abnormal and inconclusive findings on diagnostic imaging of breast: Secondary | ICD-10-CM

## 2015-06-27 ENCOUNTER — Other Ambulatory Visit: Payer: Self-pay | Admitting: *Deleted

## 2015-06-27 MED ORDER — ALENDRONATE SODIUM 70 MG PO TABS: 70.0000 mg | ORAL_TABLET | ORAL | Status: DC

## 2015-07-03 ENCOUNTER — Ambulatory Visit (HOSPITAL_COMMUNITY)
Admission: RE | Admit: 2015-07-03 | Discharge: 2015-07-03 | Disposition: A | Discharge status: Home / Self Care | Payer: Commercial Managed Care - HMO | Source: Ambulatory Visit | Attending: Family Medicine | Admitting: Family Medicine

## 2015-07-03 ENCOUNTER — Other Ambulatory Visit: Payer: Self-pay | Admitting: Family Medicine

## 2015-07-03 DIAGNOSIS — R928 Other abnormal and inconclusive findings on diagnostic imaging of breast: Secondary | ICD-10-CM | POA: Diagnosis not present

## 2015-07-03 DIAGNOSIS — N63 Unspecified lump in breast: Secondary | ICD-10-CM | POA: Diagnosis not present

## 2015-07-03 DIAGNOSIS — N6320 Unspecified lump in the left breast, unspecified quadrant: Secondary | ICD-10-CM

## 2015-08-08 ENCOUNTER — Ambulatory Visit (INDEPENDENT_AMBULATORY_CARE_PROVIDER_SITE_OTHER): Payer: Commercial Managed Care - HMO | Admitting: Family Medicine

## 2015-08-08 ENCOUNTER — Encounter: Payer: Self-pay | Admitting: Family Medicine

## 2015-08-08 VITALS — BP 132/80 | HR 76 | Temp 98.8°F | Resp 12 | Ht 67.0 in | Wt 232.0 lb

## 2015-08-08 DIAGNOSIS — I10 Essential (primary) hypertension: Secondary | ICD-10-CM | POA: Diagnosis not present

## 2015-08-08 DIAGNOSIS — E1143 Type 2 diabetes mellitus with diabetic autonomic (poly)neuropathy: Secondary | ICD-10-CM

## 2015-08-08 DIAGNOSIS — Z794 Long term (current) use of insulin: Secondary | ICD-10-CM

## 2015-08-08 DIAGNOSIS — E669 Obesity, unspecified: Secondary | ICD-10-CM | POA: Diagnosis not present

## 2015-08-08 DIAGNOSIS — G990 Autonomic neuropathy in diseases classified elsewhere: Secondary | ICD-10-CM | POA: Diagnosis not present

## 2015-08-08 DIAGNOSIS — E1141 Type 2 diabetes mellitus with diabetic mononeuropathy: Secondary | ICD-10-CM | POA: Diagnosis not present

## 2015-08-08 LAB — COMPREHENSIVE METABOLIC PANEL
ALK PHOS: 108 U/L (ref 33–130)
ALT: 19 U/L (ref 6–29)
AST: 17 U/L (ref 10–35)
Albumin: 3.9 g/dL (ref 3.6–5.1)
BUN: 14 mg/dL (ref 7–25)
CO2: 25 mmol/L (ref 20–31)
Calcium: 8.7 mg/dL (ref 8.6–10.4)
Chloride: 99 mmol/L (ref 98–110)
Creat: 0.73 mg/dL (ref 0.50–0.99)
Glucose, Bld: 153 mg/dL — ABNORMAL HIGH (ref 70–99)
POTASSIUM: 4.2 mmol/L (ref 3.5–5.3)
Sodium: 138 mmol/L (ref 135–146)
TOTAL PROTEIN: 7.5 g/dL (ref 6.1–8.1)
Total Bilirubin: 0.3 mg/dL (ref 0.2–1.2)

## 2015-08-08 LAB — HEMOGLOBIN A1C
HEMOGLOBIN A1C: 9.3 % — AB (ref <5.7)
Mean Plasma Glucose: 220 mg/dL — ABNORMAL HIGH (ref <117)

## 2015-08-08 MED ORDER — ATORVASTATIN CALCIUM 80 MG PO TABS: 80.0000 mg | ORAL_TABLET | Freq: Every day | ORAL | Status: DC

## 2015-08-08 MED ORDER — METFORMIN HCL 500 MG PO TABS: 1000.0000 mg | ORAL_TABLET | Freq: Two times a day (BID) | ORAL | Status: DC

## 2015-08-08 MED ORDER — AMITRIPTYLINE HCL 50 MG PO TABS: 50.0000 mg | ORAL_TABLET | Freq: Every day | ORAL | Status: DC

## 2015-08-08 MED ORDER — ALENDRONATE SODIUM 70 MG PO TABS
70.0000 mg | ORAL_TABLET | ORAL | Status: DC
Start: 2015-08-08 — End: 2016-02-12

## 2015-08-08 MED ORDER — AMLODIPINE BESYLATE 10 MG PO TABS: 10.0000 mg | ORAL_TABLET | Freq: Every day | ORAL | Status: DC

## 2015-08-08 MED ORDER — INSULIN DETEMIR 100 UNIT/ML FLEXPEN: PEN_INJECTOR | SUBCUTANEOUS | Status: DC

## 2015-08-08 MED ORDER — NAPROXEN 500 MG PO TABS: 500.0000 mg | ORAL_TABLET | Freq: Two times a day (BID) | ORAL | Status: DC

## 2015-08-08 NOTE — Progress Notes (Signed)
Patient ID: Sue Wood, female   DOB: 03-19-1946, 69 y.o.   MRN: [id number]   Subjective:    Patient ID: Sue Wood, female    DOB: 1945-09-11, 69 y.o.   MRN: [id number]  Patient presents for 3 month F/U  patient to follow-up chronic medical problems. She is history of diabetes mellitus which has been uncontrolled. Her last A1c was 8.1%. She is currently on Levemir 60 units and 20 units of NovoLog with each meal. He states her blood sugars have been 170-180 fasting however this morning it was 200. She's not had any hypoglycemia no polyuria or polydipsia. She does not have any concerns today.    Review Of Systems:  GEN- denies fatigue, fever, weight loss,weakness, recent illness HEENT- denies eye drainage, change in vision, nasal discharge, CVS- denies chest pain, palpitations RESP- denies SOB, cough, wheeze ABD- denies N/V, change in stools, abd pain Neuro- denies headache, dizziness, syncope, seizure activity       Objective:    BP 132/80 mmHg  Pulse 76  Temp(Src) 98.8 F (37.1 C) (Oral)  Resp 12  Ht 5\' 7"  (1.702 m)  Wt 232 lb (105.235 kg)  BMI 36.33 kg/m2 GEN- NAD, alert and oriented x3 HEENT- PERRL, EOMI, non injected sclera, pink conjunctiva, MMM, oropharynx clear CVS- RRR, no murmur RESP-CTAB ABD-NABS,soft,NT,ND EXT-mild pedal edema Pulses- Radial, DP- 2+        Assessment & Plan:      Problem List Items Addressed This Visit    None      Note: This dictation was prepared with Dragon dictation along with smaller phrase technology. Any transcriptional errors that result from this process are unintentional.

## 2015-08-08 NOTE — Assessment & Plan Note (Signed)
Diabetes is uncontrolled however it has been slowly improving. Goals to get her less than 8%. On recheck her A1c today. Continue Levemir and NovoLog with each meal she is also on metformin

## 2015-08-08 NOTE — Assessment & Plan Note (Signed)
Blood pressure is well controlled and the change in medication. She has some mild swelling today and have her elevate the feet she has been on them throughout the holiday weekend.

## 2015-08-08 NOTE — Assessment & Plan Note (Signed)
Continue to work on healthy eating and diet. She has made some mild strides with this by trying to reduce her sugar intake.

## 2015-08-09 LAB — MICROALBUMIN / CREATININE URINE RATIO
CREATININE, URINE: 90 mg/dL (ref 20–320)
MICROALB UR: 2.8 mg/dL
MICROALB/CREAT RATIO: 31 ug/mg{creat} — AB (ref <30)

## 2015-08-13 ENCOUNTER — Other Ambulatory Visit: Payer: Self-pay | Admitting: *Deleted

## 2015-08-13 MED ORDER — INSULIN DETEMIR 100 UNIT/ML FLEXPEN: PEN_INJECTOR | SUBCUTANEOUS | Status: DC

## 2015-08-13 MED ORDER — INSULIN ASPART 100 UNIT/ML FLEXPEN: PEN_INJECTOR | SUBCUTANEOUS | Status: DC

## 2015-11-30 ENCOUNTER — Other Ambulatory Visit: Payer: Self-pay | Admitting: Family Medicine

## 2015-11-30 NOTE — Telephone Encounter (Signed)
Refill appropriate and filled per protocol. 

## 2015-12-05 ENCOUNTER — Ambulatory Visit (INDEPENDENT_AMBULATORY_CARE_PROVIDER_SITE_OTHER): Payer: Commercial Managed Care - HMO | Admitting: Family Medicine

## 2015-12-05 ENCOUNTER — Encounter: Payer: Self-pay | Admitting: Family Medicine

## 2015-12-05 VITALS — BP 130/76 | HR 78 | Temp 99.6°F | Resp 14 | Ht 67.0 in | Wt 237.0 lb

## 2015-12-05 DIAGNOSIS — E1141 Type 2 diabetes mellitus with diabetic mononeuropathy: Secondary | ICD-10-CM | POA: Diagnosis not present

## 2015-12-05 DIAGNOSIS — E1143 Type 2 diabetes mellitus with diabetic autonomic (poly)neuropathy: Secondary | ICD-10-CM

## 2015-12-05 DIAGNOSIS — E785 Hyperlipidemia, unspecified: Secondary | ICD-10-CM

## 2015-12-05 DIAGNOSIS — E669 Obesity, unspecified: Secondary | ICD-10-CM

## 2015-12-05 DIAGNOSIS — I10 Essential (primary) hypertension: Secondary | ICD-10-CM

## 2015-12-05 DIAGNOSIS — Z794 Long term (current) use of insulin: Secondary | ICD-10-CM

## 2015-12-05 DIAGNOSIS — G990 Autonomic neuropathy in diseases classified elsewhere: Secondary | ICD-10-CM | POA: Diagnosis not present

## 2015-12-05 LAB — COMPREHENSIVE METABOLIC PANEL
ALBUMIN: 4.1 g/dL (ref 3.6–5.1)
ALK PHOS: 81 U/L (ref 33–130)
ALT: 18 U/L (ref 6–29)
AST: 17 U/L (ref 10–35)
BUN: 12 mg/dL (ref 7–25)
CHLORIDE: 101 mmol/L (ref 98–110)
CO2: 26 mmol/L (ref 20–31)
Calcium: 9.3 mg/dL (ref 8.6–10.4)
Creat: 0.81 mg/dL (ref 0.50–0.99)
Glucose, Bld: 203 mg/dL — ABNORMAL HIGH (ref 70–99)
POTASSIUM: 4.2 mmol/L (ref 3.5–5.3)
Sodium: 139 mmol/L (ref 135–146)
TOTAL PROTEIN: 7.3 g/dL (ref 6.1–8.1)
Total Bilirubin: 0.3 mg/dL (ref 0.2–1.2)

## 2015-12-05 LAB — CBC WITH DIFFERENTIAL/PLATELET
BASOS PCT: 1 % (ref 0–1)
Basophils Absolute: 0 10*3/uL (ref 0.0–0.1)
EOS ABS: 0.1 10*3/uL (ref 0.0–0.7)
Eosinophils Relative: 2 % (ref 0–5)
HCT: 36.9 % (ref 36.0–46.0)
Hemoglobin: 12.4 g/dL (ref 12.0–15.0)
Lymphocytes Relative: 30 % (ref 12–46)
Lymphs Abs: 1.4 10*3/uL (ref 0.7–4.0)
MCH: 29.5 pg (ref 26.0–34.0)
MCHC: 33.6 g/dL (ref 30.0–36.0)
MCV: 87.9 fL (ref 78.0–100.0)
MONO ABS: 0.2 10*3/uL (ref 0.1–1.0)
MONOS PCT: 5 % (ref 3–12)
MPV: 10.5 fL (ref 8.6–12.4)
NEUTROS ABS: 2.9 10*3/uL (ref 1.7–7.7)
Neutrophils Relative %: 62 % (ref 43–77)
Platelets: 273 10*3/uL (ref 150–400)
RBC: 4.2 MIL/uL (ref 3.87–5.11)
RDW: 14.9 % (ref 11.5–15.5)
WBC: 4.6 10*3/uL (ref 4.0–10.5)

## 2015-12-05 LAB — LIPID PANEL
Cholesterol: 177 mg/dL (ref 125–200)
HDL: 51 mg/dL (ref >=46)
LDL CALC: 106 mg/dL (ref <130)
TRIGLYCERIDES: 101 mg/dL (ref <150)
Total CHOL/HDL Ratio: 3.5 Ratio (ref <=5.0)
VLDL: 20 mg/dL (ref <30)

## 2015-12-05 NOTE — Assessment & Plan Note (Signed)
Blood pressure controlled. 

## 2015-12-05 NOTE — Patient Instructions (Signed)
We will call with labs Referral to nutritionist Referral to eye doctor Get on bike 30 minutes 5 days a week  F/U 3 months

## 2015-12-05 NOTE — Assessment & Plan Note (Addendum)
Diabetes uncontrolled Will recheck A1C Refer to diabetic nutritiomist, has gained another 5 lbs, her snacks have too many carbs  Referral to eye doctor  Goal < 8%

## 2015-12-05 NOTE — Progress Notes (Signed)
Patient ID: Sue Wood, female   DOB: 01-03-1946, 70 y.o.   MRN: [id number]   Subjective:    Patient ID: Sue Wood, female    DOB: [birth year], 70 y.o.   MRN: [id number]  Patient presents for F/U  Patient here to follow-up chronic medical polyps. Diabetes mellitus her last A1c was 9.3% increase her Levemir 65 units she is on 20 units of NovoLog with each meal Her renal function has been preserved. She is due for repeat lipid panel. She states that her fasting blood sugar this morning was 198 when she eat a snack at nighttime which she did not do last night she states her fasting blood sugars are 140 to 150s she has not been skipping any insulin. Before meal time during the day she said her blood sugars in the 170s. She denies any hypoglycemia. When asked about her snacks she states that she eats peanut butter on graham crackers she also eats cereal she is changed to wheat bread but eats peanut butter on bread a lot as well. She denies eating fast food because of low finances and states that she has cut out potatoes impossible and right. She also has a new exercise bike at home which she is starting to use. She unfortunately has gained 5 pounds since her last visit   Review Of Systems:  GEN- denies fatigue, fever, weight loss,weakness, recent illness HEENT- denies eye drainage, change in vision, nasal discharge, CVS- denies chest pain, palpitations RESP- denies SOB, cough, wheeze ABD- denies N/V, change in stools, abd pain GU- denies dysuria, hematuria, dribbling, incontinence MSK- denies joint pain, muscle aches, injury Neuro- denies headache, dizziness, syncope, seizure activity       Objective:    BP 130/76 mmHg  Pulse 78  Temp(Src) 99.6 F (37.6 C) (Oral)  Resp 14  Ht 5\' 7"  (1.702 m)  Wt 237 lb (107.502 kg)  BMI 37.11 kg/m2 GEN- NAD, alert and oriented x3 HEENT- PERRL, EOMI, non injected sclera, pink conjunctiva, MMM, oropharynx clear CVS- RRR, no  murmur RESP-CTAB EXT- trace left pedal edema Pulses- Radial, DP- 2+        Assessment & Plan:      Problem List Items Addressed This Visit    Obesity   Hyperlipidemia   Relevant Orders   Lipid panel   Essential hypertension, benign    Blood pressure controlled       Diabetic neuropathy (HCC)   Diabetes mellitus with neurological manifestation ([place]) - Primary    Diabetes uncontrolled Will recheck A1C Refer to diabetic nutritiomist, has gained another 5 lbs, her snacks have too many carbs  Referral to eye doctor  Goal < 8%      Relevant Orders   CBC with Differential/Platelet   Comprehensive metabolic panel   Hemoglobin A1c      Note: This dictation was prepared with Dragon dictation along with smaller phrase technology. Any transcriptional errors that result from this process are unintentional.

## 2015-12-06 LAB — HEMOGLOBIN A1C
HEMOGLOBIN A1C: 9.4 % — AB (ref <5.7)
MEAN PLASMA GLUCOSE: 223 mg/dL

## 2015-12-12 ENCOUNTER — Other Ambulatory Visit: Payer: Self-pay | Admitting: *Deleted

## 2015-12-12 DIAGNOSIS — H524 Presbyopia: Secondary | ICD-10-CM | POA: Diagnosis not present

## 2015-12-12 DIAGNOSIS — E1143 Type 2 diabetes mellitus with diabetic autonomic (poly)neuropathy: Secondary | ICD-10-CM

## 2015-12-12 DIAGNOSIS — E1141 Type 2 diabetes mellitus with diabetic mononeuropathy: Secondary | ICD-10-CM

## 2015-12-12 DIAGNOSIS — E119 Type 2 diabetes mellitus without complications: Secondary | ICD-10-CM | POA: Diagnosis not present

## 2015-12-12 DIAGNOSIS — H5210 Myopia, unspecified eye: Secondary | ICD-10-CM | POA: Diagnosis not present

## 2015-12-12 DIAGNOSIS — Z794 Long term (current) use of insulin: Principal | ICD-10-CM

## 2015-12-12 LAB — HM DIABETES EYE EXAM

## 2015-12-18 ENCOUNTER — Telehealth: Payer: Self-pay | Admitting: *Deleted

## 2015-12-18 NOTE — Telephone Encounter (Signed)
Submitted humana referral thru [facility] for authorization on 12/14/15 to Dr. Warren Danes with authorization number [id number]  Requesting provider: Neysa Hotter  Treating provider: Loetta Rough  Number of visits: 6  Start Date: 01/21/16  End Date: 07/19/2016  Dx: E11.43-Type 2 diabetes w diabetic autonomic (poly) neuropathy       Z79.4- Long term (current) use of insulin      E11.41- Type 2 diabetes mellitus with diabetic mononeuropathy

## 2015-12-27 ENCOUNTER — Ambulatory Visit: Payer: Commercial Managed Care - HMO | Admitting: *Deleted

## 2016-02-12 ENCOUNTER — Other Ambulatory Visit: Payer: Self-pay

## 2016-02-12 MED ORDER — ACCU-CHEK AVIVA VI SOLN: Status: DC

## 2016-02-12 MED ORDER — AMITRIPTYLINE HCL 50 MG PO TABS: 50.0000 mg | ORAL_TABLET | Freq: Every day | ORAL | Status: DC

## 2016-02-12 MED ORDER — NAPROXEN 500 MG PO TABS: 500.0000 mg | ORAL_TABLET | Freq: Two times a day (BID) | ORAL | Status: DC

## 2016-02-12 MED ORDER — ALENDRONATE SODIUM 70 MG PO TABS: 70.0000 mg | ORAL_TABLET | ORAL | Status: DC

## 2016-02-12 MED ORDER — ALCOHOL SWABS 70 % PADS: MEDICATED_PAD | Status: AC

## 2016-02-12 MED ORDER — ACCU-CHEK AVIVA PLUS W/DEVICE KIT: PACK | Status: DC

## 2016-02-12 MED ORDER — METFORMIN HCL 500 MG PO TABS: 1000.0000 mg | ORAL_TABLET | Freq: Two times a day (BID) | ORAL | Status: DC

## 2016-02-12 MED ORDER — ATORVASTATIN CALCIUM 80 MG PO TABS: 80.0000 mg | ORAL_TABLET | Freq: Every day | ORAL | Status: DC

## 2016-02-12 MED ORDER — GLUCOSE BLOOD VI STRP: ORAL_STRIP | Status: DC

## 2016-02-12 MED ORDER — INSULIN DETEMIR 100 UNIT/ML FLEXPEN: PEN_INJECTOR | SUBCUTANEOUS | Status: DC

## 2016-02-12 MED ORDER — ASPIRIN 81 MG PO TABS: 81.0000 mg | ORAL_TABLET | Freq: Every day | ORAL | Status: AC

## 2016-02-12 MED ORDER — AMLODIPINE BESYLATE 10 MG PO TABS: 10.0000 mg | ORAL_TABLET | Freq: Every day | ORAL | Status: DC

## 2016-02-12 MED ORDER — INSULIN ASPART 100 UNIT/ML FLEXPEN
PEN_INJECTOR | SUBCUTANEOUS | Status: DC
Start: 2016-02-12 — End: 2017-02-11

## 2016-02-12 MED ORDER — ACCU-CHEK SOFTCLIX LANCETS MISC: Status: DC

## 2016-03-25 ENCOUNTER — Encounter: Payer: Self-pay | Admitting: Family Medicine

## 2016-03-25 NOTE — Telephone Encounter (Addendum)
Error

## 2016-03-25 NOTE — Telephone Encounter (Signed)
This encounter was created in error - please disregard.

## 2016-04-22 ENCOUNTER — Ambulatory Visit (INDEPENDENT_AMBULATORY_CARE_PROVIDER_SITE_OTHER): Payer: Commercial Managed Care - HMO | Admitting: Family Medicine

## 2016-04-22 ENCOUNTER — Encounter: Payer: Self-pay | Admitting: Family Medicine

## 2016-04-22 VITALS — BP 132/70 | HR 88 | Temp 98.9°F | Resp 16 | Ht 67.0 in | Wt 228.0 lb

## 2016-04-22 DIAGNOSIS — I10 Essential (primary) hypertension: Secondary | ICD-10-CM | POA: Diagnosis not present

## 2016-04-22 DIAGNOSIS — E785 Hyperlipidemia, unspecified: Secondary | ICD-10-CM

## 2016-04-22 DIAGNOSIS — E1143 Type 2 diabetes mellitus with diabetic autonomic (poly)neuropathy: Secondary | ICD-10-CM

## 2016-04-22 DIAGNOSIS — Z794 Long term (current) use of insulin: Secondary | ICD-10-CM

## 2016-04-22 DIAGNOSIS — E669 Obesity, unspecified: Secondary | ICD-10-CM | POA: Diagnosis not present

## 2016-04-22 DIAGNOSIS — E1141 Type 2 diabetes mellitus with diabetic mononeuropathy: Secondary | ICD-10-CM

## 2016-04-22 DIAGNOSIS — Z23 Encounter for immunization: Secondary | ICD-10-CM | POA: Diagnosis not present

## 2016-04-22 LAB — COMPREHENSIVE METABOLIC PANEL
ALT: 19 U/L (ref 6–29)
AST: 21 U/L (ref 10–35)
Albumin: 4.2 g/dL (ref 3.6–5.1)
Alkaline Phosphatase: 84 U/L (ref 33–130)
BILIRUBIN TOTAL: 0.4 mg/dL (ref 0.2–1.2)
BUN: 15 mg/dL (ref 7–25)
CHLORIDE: 105 mmol/L (ref 98–110)
CO2: 24 mmol/L (ref 20–31)
CREATININE: 0.85 mg/dL (ref 0.50–0.99)
Calcium: 9 mg/dL (ref 8.6–10.4)
Glucose, Bld: 191 mg/dL — ABNORMAL HIGH (ref 70–99)
Potassium: 4.1 mmol/L (ref 3.5–5.3)
SODIUM: 139 mmol/L (ref 135–146)
TOTAL PROTEIN: 7.3 g/dL (ref 6.1–8.1)

## 2016-04-22 LAB — CBC WITH DIFFERENTIAL/PLATELET
BASOS PCT: 1 %
Basophils Absolute: 41 cells/uL (ref 0–200)
EOS ABS: 82 {cells}/uL (ref 15–500)
EOS PCT: 2 %
HCT: 35.3 % (ref 35.0–45.0)
HEMOGLOBIN: 11.8 g/dL — AB (ref 12.0–15.0)
Lymphs Abs: 1476 cells/uL (ref 850–3900)
MCH: 28.9 pg (ref 27.0–33.0)
MCHC: 33.4 g/dL (ref 32.0–36.0)
MCV: 86.5 fL (ref 80.0–100.0)
MONOS PCT: 5 %
MPV: 10.9 fL (ref 7.5–12.5)
Monocytes Absolute: 205 cells/uL (ref 200–950)
NEUTROS ABS: 2296 {cells}/uL (ref 1500–7800)
Neutrophils Relative %: 56 %
PLATELETS: 287 10*3/uL (ref 140–400)
RBC: 4.08 MIL/uL (ref 3.80–5.10)
RDW: 15.4 % — ABNORMAL HIGH (ref 11.0–15.0)
WBC: 4.1 10*3/uL (ref 3.8–10.8)

## 2016-04-22 LAB — LIPID PANEL
CHOLESTEROL: 154 mg/dL (ref 125–200)
HDL: 52 mg/dL (ref >=46)
LDL CALC: 85 mg/dL (ref <130)
Total CHOL/HDL Ratio: 3 Ratio (ref <=5.0)
Triglycerides: 85 mg/dL (ref <150)
VLDL: 17 mg/dL (ref <30)

## 2016-04-22 NOTE — Addendum Note (Signed)
Addended by: Sheral Flow on: 04/22/2016 11:11 AM   Modules accepted: Orders

## 2016-04-22 NOTE — Assessment & Plan Note (Signed)
Blood pressure is well-controlled no change in medication 

## 2016-04-22 NOTE — Patient Instructions (Addendum)
F/U 4 months Physical  We will call with lab results 

## 2016-04-22 NOTE — Progress Notes (Signed)
   Subjective:    Patient ID: Sue Wood, female    DOB: [birth year], 70 y.o.   MRN: [id number]  Patient presents for 3 month F/U (is fasting) Is here to follow-up chronic medical problems. Diabetes mellitus last A1c is 9.4% in March. She's been injecting 65 units of her Levemir 20 units of NovoLog with meals. States that her blood sugars are now in the 160s fasting she is also intentionally lost 10 pounds. She denies any hypoglycemia. I referred her to endocrinology however she states that this was not in her network. He was seen by [facility] was told that her vision was okay.  No new concerns today Medications reviewed She is due for Pneumovax 23    Review Of Systems:  GEN- denies fatigue, fever, weight loss,weakness, recent illness HEENT- denies eye drainage, change in vision, nasal discharge, CVS- denies chest pain, palpitations RESP- denies SOB, cough, wheeze ABD- denies N/V, change in stools, abd pain GU- denies dysuria, hematuria, dribbling, incontinence MSK- denies joint pain, muscle aches, injury Neuro- denies headache, dizziness, syncope, seizure activity       Objective:    BP 132/70 (BP Location: Left Arm, Patient Position: Sitting, Cuff Size: Large)   Pulse 88   Temp 98.9 F (37.2 C) (Oral)   Resp 16   Ht 5\' 7"  (1.702 m)   Wt 228 lb (103.4 kg)   BMI 35.71 kg/m  GEN- NAD, alert and oriented x3 HEENT- PERRL, EOMI, non injected sclera, pink conjunctiva, MMM, oropharynx clear CVS- RRR, no murmur RESP-CTAB ABD-NABS,soft,NT,ND EXT- left ankle edema chronic  Pulses- Radial, DP- 2+        Assessment & Plan:      Problem List Items Addressed This Visit    Obesity    Congratulated on weight loss continue with healthy eating. Juliann Pulse to get her around 215lbs      Hyperlipidemia - Primary   Relevant Orders   Lipid panel   Essential hypertension, benign    Blood pressure is well controlled no change in medication      Diabetic neuropathy ([place])    Diabetes mellitus with neurological manifestation ([place])    Diabetes has been uncontrolled. I'm not sure what happened with the referral versus states that it was verified in her chart. In any regimen to recheck her levels today. Her fasting blood sugars have improved and she has lost some weight. Her goal is at least get her A1c less than 8%      Relevant Orders   CBC with Differential/Platelet   Comprehensive metabolic panel   Hemoglobin A1c    Other Visit Diagnoses   None.     Note: This dictation was prepared with Dragon dictation along with smaller phrase technology. Any transcriptional errors that result from this process are unintentional.

## 2016-04-22 NOTE — Assessment & Plan Note (Signed)
Congratulated on weight loss continue with healthy eating. Juliann Pulse to get her around 215lbs

## 2016-04-22 NOTE — Assessment & Plan Note (Addendum)
Diabetes has been uncontrolled. I'm not sure what happened with the referral versus states that it was verified in her chart. In any regimen to recheck her levels today. Her fasting blood sugars have improved and she has lost some weight. Her goal is at least get her A1c less than 8%  Pneumonia vaccine given

## 2016-04-23 LAB — HEMOGLOBIN A1C
Hgb A1c MFr Bld: 7.8 % — ABNORMAL HIGH (ref <5.7)
MEAN PLASMA GLUCOSE: 177 mg/dL

## 2016-04-25 ENCOUNTER — Encounter: Payer: Self-pay | Admitting: *Deleted

## 2016-06-01 IMAGING — MG MM DIGITAL SCREENING
6 of 10 series · 6 of 26 positions shown · non-contrast
Comparison: Previous exam(s).

CLINICAL DATA: Screening.

EXAM:
DIGITAL SCREENING BILATERAL MAMMOGRAM WITH 3D TOMO WITH CAD

[R CC (1 of 2)]
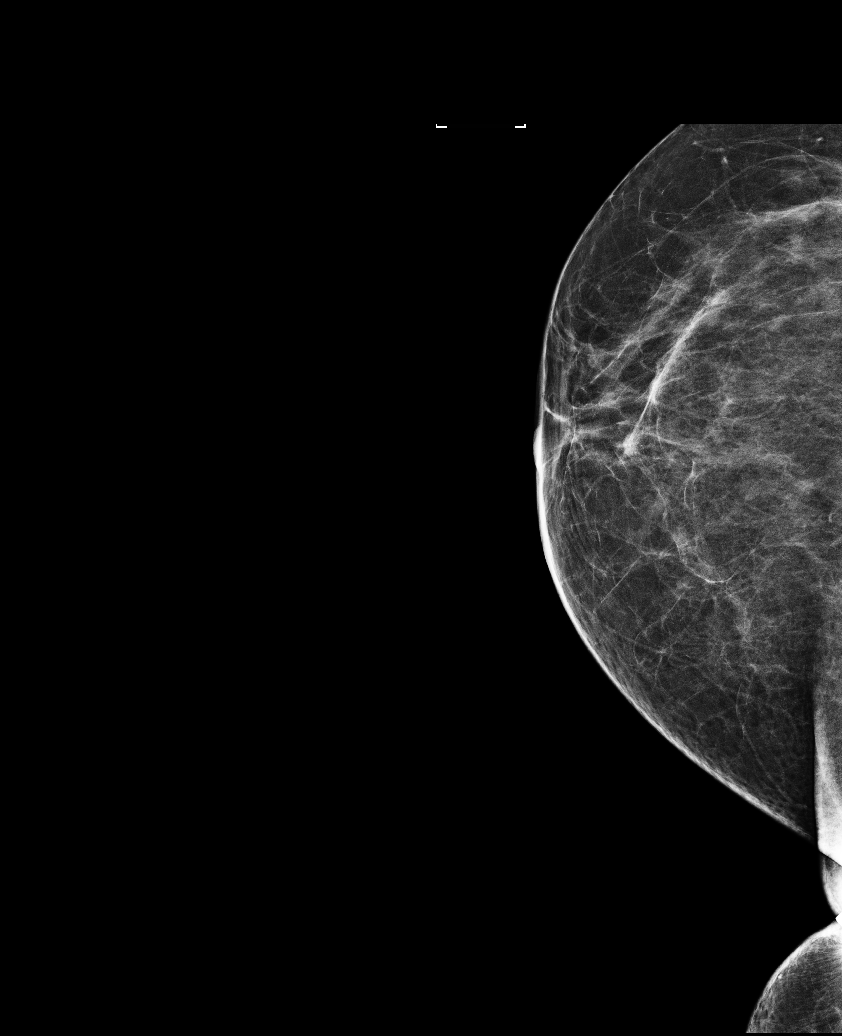

[L CC (1 of 2)]
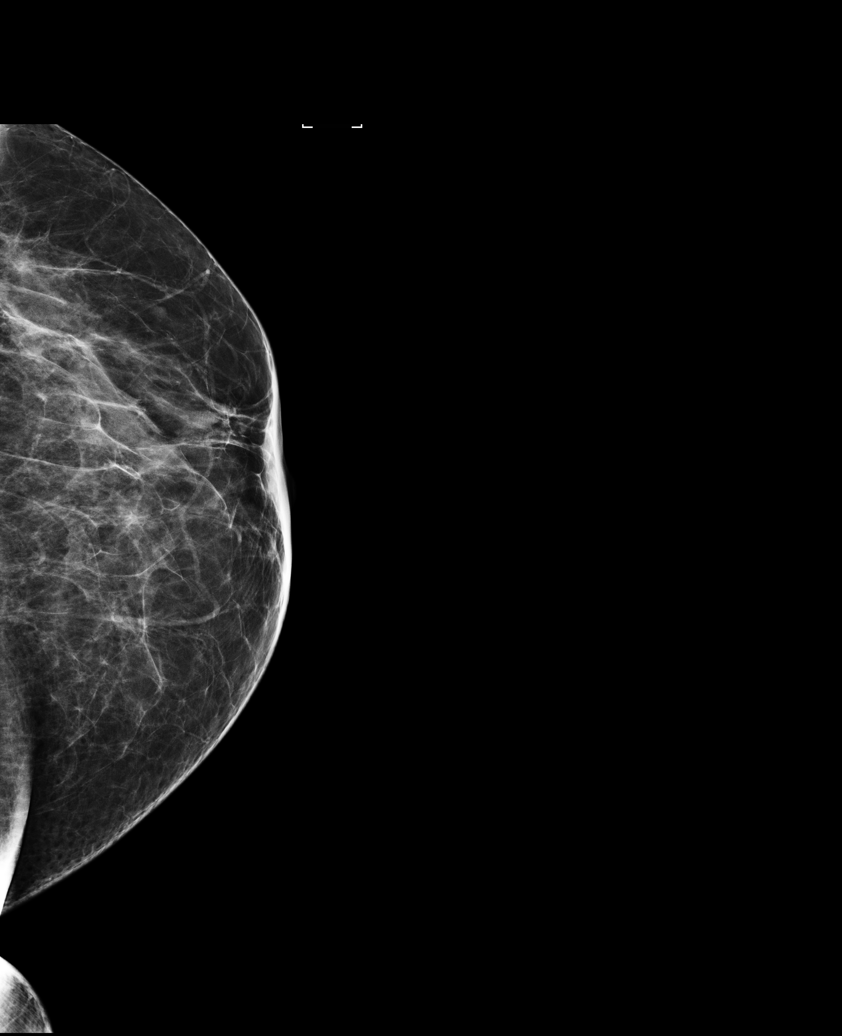

[R MLO]
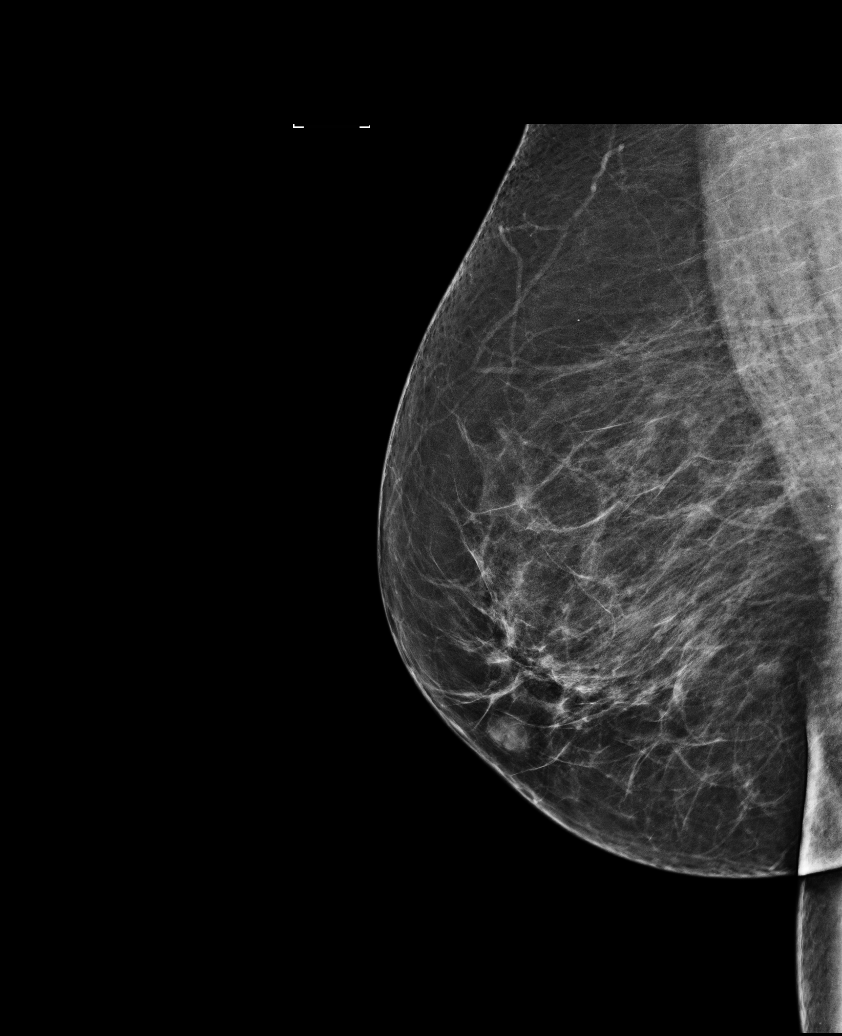

[L MLO]
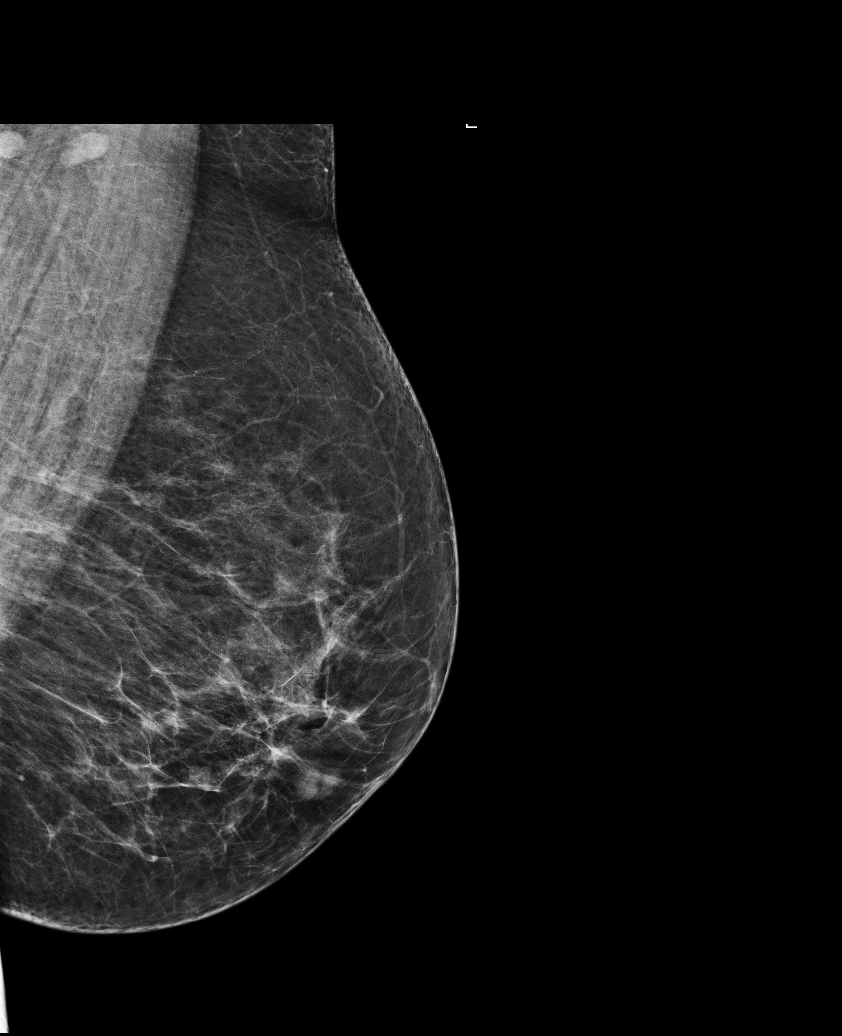

[R CC (2 of 2)]
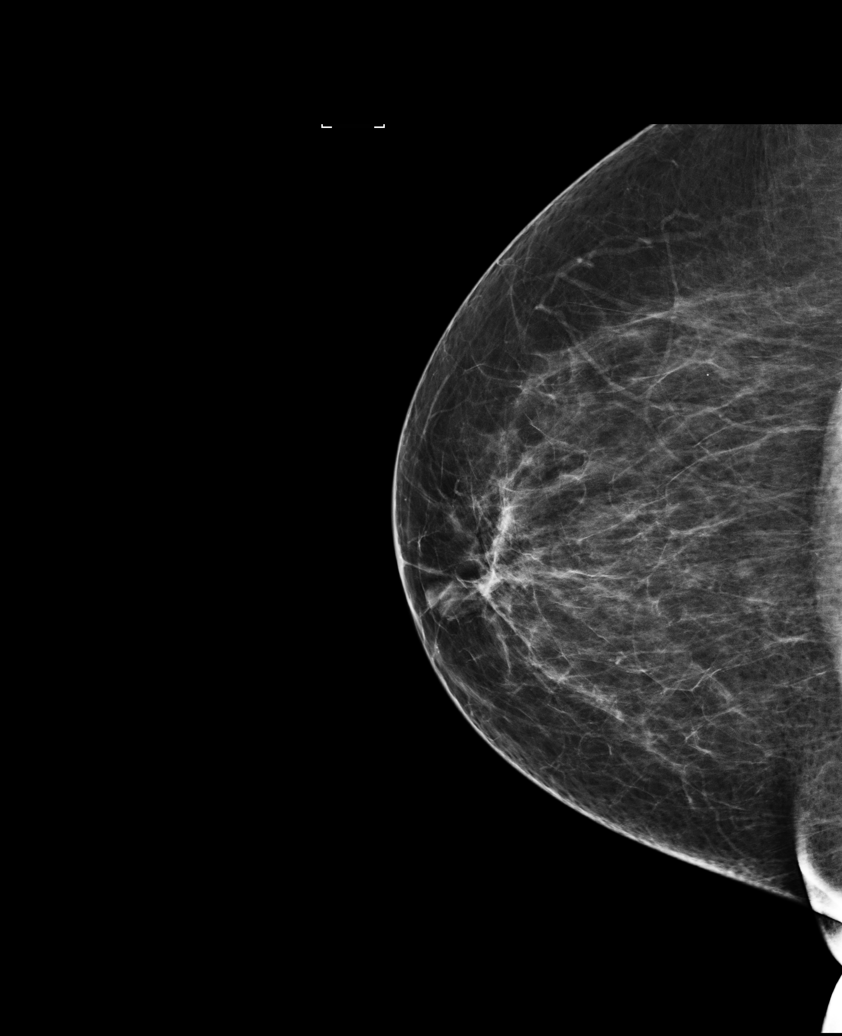

[L CC (2 of 2)]
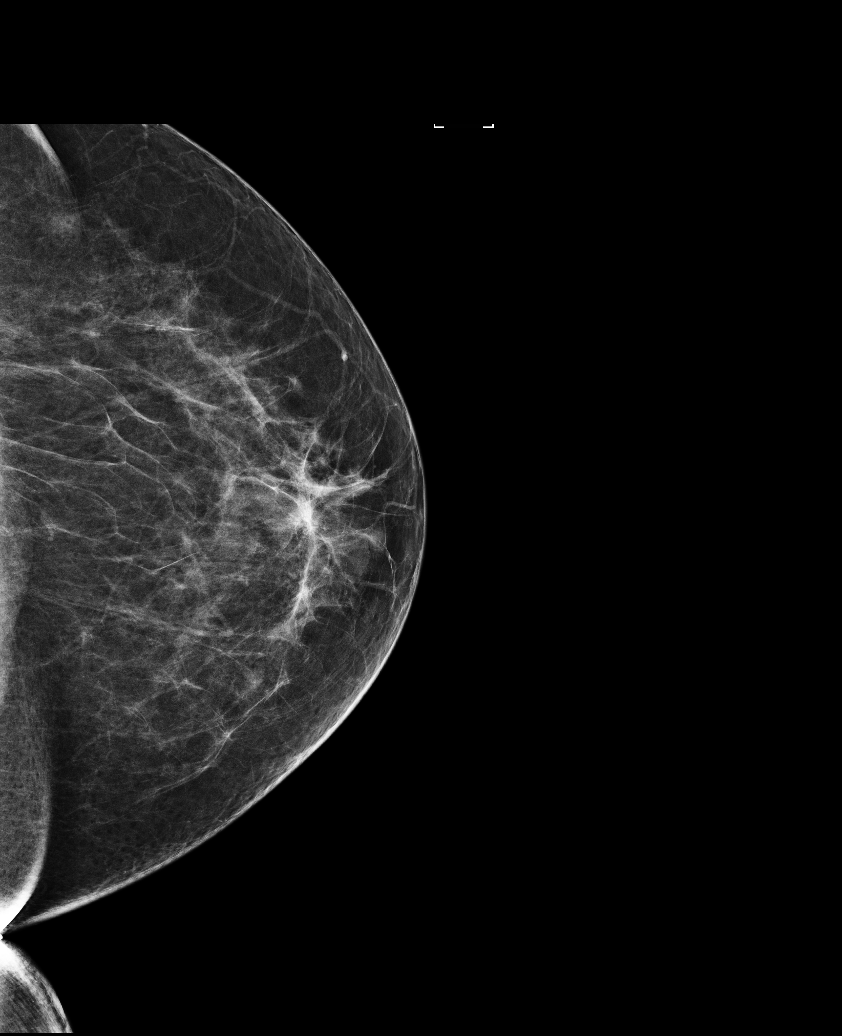

[6 of 26 positions shown; findings below may reference images not displayed]

ACR Breast Density Category b: There are scattered areas of
fibroglandular density.
FINDINGS: In the left breast, a possible asymmetry warrants further
evaluation. In the right breast, no findings suspicious for
malignancy. Images were processed with CAD.
IMPRESSION: Further evaluation is suggested for possible asymmetry in the left
breast.

RECOMMENDATION:
Diagnostic mammogram and possibly ultrasound of the left breast.
(Code:[id number])

The patient will be contacted regarding the findings, and additional
imaging will be scheduled.

BI-RADS CATEGORY  0: Incomplete. Need additional imaging evaluation
and/or prior mammograms for comparison.

## 2016-08-13 ENCOUNTER — Ambulatory Visit (INDEPENDENT_AMBULATORY_CARE_PROVIDER_SITE_OTHER): Payer: Commercial Managed Care - HMO | Admitting: Family Medicine

## 2016-08-13 ENCOUNTER — Encounter: Payer: Self-pay | Admitting: Family Medicine

## 2016-08-13 VITALS — BP 132/70 | HR 86 | Temp 98.8°F | Resp 12 | Ht 67.0 in | Wt 230.0 lb

## 2016-08-13 DIAGNOSIS — E1141 Type 2 diabetes mellitus with diabetic mononeuropathy: Secondary | ICD-10-CM | POA: Diagnosis not present

## 2016-08-13 DIAGNOSIS — Z794 Long term (current) use of insulin: Secondary | ICD-10-CM

## 2016-08-13 DIAGNOSIS — E6609 Other obesity due to excess calories: Secondary | ICD-10-CM

## 2016-08-13 DIAGNOSIS — I10 Essential (primary) hypertension: Secondary | ICD-10-CM

## 2016-08-13 DIAGNOSIS — Z6836 Body mass index (BMI) 36.0-36.9, adult: Secondary | ICD-10-CM

## 2016-08-13 DIAGNOSIS — Z23 Encounter for immunization: Secondary | ICD-10-CM | POA: Diagnosis not present

## 2016-08-13 DIAGNOSIS — E1143 Type 2 diabetes mellitus with diabetic autonomic (poly)neuropathy: Secondary | ICD-10-CM

## 2016-08-13 DIAGNOSIS — IMO0001 Reserved for inherently not codable concepts without codable children: Secondary | ICD-10-CM

## 2016-08-13 LAB — COMPREHENSIVE METABOLIC PANEL
ALK PHOS: 93 U/L (ref 33–130)
ALT: 17 U/L (ref 6–29)
AST: 16 U/L (ref 10–35)
Albumin: 3.9 g/dL (ref 3.6–5.1)
BUN: 14 mg/dL (ref 7–25)
CALCIUM: 8.9 mg/dL (ref 8.6–10.4)
CO2: 24 mmol/L (ref 20–31)
Chloride: 104 mmol/L (ref 98–110)
Creat: 0.82 mg/dL (ref 0.50–0.99)
GLUCOSE: 167 mg/dL — AB (ref 70–99)
POTASSIUM: 4.3 mmol/L (ref 3.5–5.3)
Sodium: 140 mmol/L (ref 135–146)
TOTAL PROTEIN: 7 g/dL (ref 6.1–8.1)
Total Bilirubin: 0.3 mg/dL (ref 0.2–1.2)

## 2016-08-13 NOTE — Addendum Note (Signed)
Addended by: Asencion Partridge H on: 08/13/2016 01:00 PM   Modules accepted: Orders

## 2016-08-13 NOTE — Assessment & Plan Note (Signed)
Her diabetes control has been improving over the past few months. Her blood sugars still tend to fluctuate when she is checking at home. We'll recheck A1c the goals keep her below 8%. We'll adjust her insulin pending results. She will continue with her statin drug. Her blood pressure is controlled.  Flu shot given today. Continue to work on dietary changes watching her sugar intake and any activity she can get.

## 2016-08-13 NOTE — Progress Notes (Signed)
   Subjective:    Patient ID: Sue Wood, female    DOB: 1945/10/23, 70 y.o.   MRN: [id number]  Patient presents for 3 month F/U (is fasting)  She had follow-up chronic medical problems. Diabetes mellitus her last A1c was 7.8% which was improved from 9.4%. LDL is at goal renal function  preserved she's on statin drug ACE inhibitor and aspirin CBGs run  On average 180-215 , no hypoglycemia symptoms  Currently injecting Levemir 65 units and NovoLog 20 units with meals  Due for flu shot today   Review Of Systems:  GEN- denies fatigue, fever, weight loss,weakness, recent illness HEENT- denies eye drainage, change in vision, nasal discharge, CVS- denies chest pain, palpitations RESP- denies SOB, cough, wheeze ABD- denies N/V, change in stools, abd pain GU- denies dysuria, hematuria, dribbling, incontinence MSK- denies joint pain, muscle aches, injury Neuro- denies headache, dizziness, syncope, seizure activity       Objective:    BP 132/70 (BP Location: Left Arm, Patient Position: Sitting, Cuff Size: Large)   Pulse 86   Temp 98.8 F (37.1 C) (Oral)   Resp 12   Ht 5\' 7"  (1.702 m)   Wt 230 lb (104.3 kg)   SpO2 97%   BMI 36.02 kg/m  GEN- NAD, alert and oriented x3 HEENT- PERRL, EOMI, non injected sclera, pink conjunctiva, MMM, oropharynx clear Neck- Supple, no thyromegaly CVS- RRR, no murmur RESP-CTAB EXT- Left pedal edema Pulses- Radial 2+        Assessment & Plan:      Problem List Items Addressed This Visit    Obesity - Primary   Essential hypertension, benign   Diabetic neuropathy (HCC)   Diabetes mellitus with neurological manifestation (HCC)    Her diabetes control has been improving over the past few months. Her blood sugars still tend to fluctuate when she is checking at home. We'll recheck A1c the goals keep her below 8%. We'll adjust her insulin pending results. She will continue with her statin drug. Her blood pressure is controlled.  Flu shot given  today. Continue to work on dietary changes watching her sugar intake and any activity she can get.      Relevant Orders   Comprehensive metabolic panel   Hemoglobin A1c      Note: This dictation was prepared with Dragon dictation along with smaller phrase technology. Any transcriptional errors that result from this process are unintentional.

## 2016-08-13 NOTE — Patient Instructions (Signed)
F/U 4 months for PHYSICAL  

## 2016-08-14 LAB — HEMOGLOBIN A1C
HEMOGLOBIN A1C: 8.5 % — AB (ref <5.7)
Mean Plasma Glucose: 197 mg/dL

## 2016-10-30 ENCOUNTER — Other Ambulatory Visit: Payer: Self-pay | Admitting: Family Medicine

## 2016-11-17 ENCOUNTER — Ambulatory Visit (INDEPENDENT_AMBULATORY_CARE_PROVIDER_SITE_OTHER): Payer: Medicare HMO | Admitting: Family Medicine

## 2016-11-17 ENCOUNTER — Encounter: Payer: Self-pay | Admitting: Family Medicine

## 2016-11-17 VITALS — BP 130/78 | HR 82 | Temp 98.3°F | Resp 16 | Ht 67.0 in | Wt 239.0 lb

## 2016-11-17 DIAGNOSIS — Z6837 Body mass index (BMI) 37.0-37.9, adult: Secondary | ICD-10-CM | POA: Diagnosis not present

## 2016-11-17 DIAGNOSIS — I10 Essential (primary) hypertension: Secondary | ICD-10-CM

## 2016-11-17 DIAGNOSIS — Z794 Long term (current) use of insulin: Secondary | ICD-10-CM

## 2016-11-17 DIAGNOSIS — E6609 Other obesity due to excess calories: Secondary | ICD-10-CM | POA: Diagnosis not present

## 2016-11-17 DIAGNOSIS — E1141 Type 2 diabetes mellitus with diabetic mononeuropathy: Secondary | ICD-10-CM | POA: Diagnosis not present

## 2016-11-17 DIAGNOSIS — E1143 Type 2 diabetes mellitus with diabetic autonomic (poly)neuropathy: Secondary | ICD-10-CM

## 2016-11-17 DIAGNOSIS — E782 Mixed hyperlipidemia: Secondary | ICD-10-CM

## 2016-11-17 DIAGNOSIS — IMO0001 Reserved for inherently not codable concepts without codable children: Secondary | ICD-10-CM

## 2016-11-17 LAB — CBC WITH DIFFERENTIAL/PLATELET
BASOS ABS: 0 {cells}/uL (ref 0–200)
Basophils Relative: 0 %
EOS ABS: 183 {cells}/uL (ref 15–500)
Eosinophils Relative: 3 %
HEMATOCRIT: 36.5 % (ref 35.0–45.0)
HEMOGLOBIN: 12.1 g/dL (ref 12.0–15.0)
LYMPHS ABS: 1708 {cells}/uL (ref 850–3900)
Lymphocytes Relative: 28 %
MCH: 28.7 pg (ref 27.0–33.0)
MCHC: 33.2 g/dL (ref 32.0–36.0)
MCV: 86.5 fL (ref 80.0–100.0)
MONO ABS: 244 {cells}/uL (ref 200–950)
MONOS PCT: 4 %
MPV: 10.8 fL (ref 7.5–12.5)
NEUTROS ABS: 3965 {cells}/uL (ref 1500–7800)
Neutrophils Relative %: 65 %
Platelets: 283 10*3/uL (ref 140–400)
RBC: 4.22 MIL/uL (ref 3.80–5.10)
RDW: 15.1 % — ABNORMAL HIGH (ref 11.0–15.0)
WBC: 6.1 10*3/uL (ref 3.8–10.8)

## 2016-11-17 LAB — COMPREHENSIVE METABOLIC PANEL
ALBUMIN: 4 g/dL (ref 3.6–5.1)
ALT: 16 U/L (ref 6–29)
AST: 16 U/L (ref 10–35)
Alkaline Phosphatase: 92 U/L (ref 33–130)
BUN: 13 mg/dL (ref 7–25)
CALCIUM: 8.9 mg/dL (ref 8.6–10.4)
CHLORIDE: 104 mmol/L (ref 98–110)
CO2: 24 mmol/L (ref 20–31)
Creat: 0.86 mg/dL (ref 0.50–0.99)
Glucose, Bld: 168 mg/dL — ABNORMAL HIGH (ref 70–99)
POTASSIUM: 4 mmol/L (ref 3.5–5.3)
Sodium: 138 mmol/L (ref 135–146)
TOTAL PROTEIN: 7.4 g/dL (ref 6.1–8.1)
Total Bilirubin: 0.4 mg/dL (ref 0.2–1.2)

## 2016-11-17 LAB — LIPID PANEL
Cholesterol: 175 mg/dL (ref <200)
HDL: 47 mg/dL — AB (ref >50)
LDL Cholesterol: 109 mg/dL — ABNORMAL HIGH (ref <100)
TRIGLYCERIDES: 97 mg/dL (ref <150)
Total CHOL/HDL Ratio: 3.7 Ratio (ref <5.0)
VLDL: 19 mg/dL (ref <30)

## 2016-11-17 NOTE — Assessment & Plan Note (Signed)
Given handouts on portions, carbs in diabetics

## 2016-11-17 NOTE — Patient Instructions (Addendum)
F/U 3 months for Physical  

## 2016-11-17 NOTE — Assessment & Plan Note (Signed)
Diabetes is uncontrolled by colds at least keep her below 8%. Try to set her up with nutritionist again she's had difficulties with transportation which is why she has not been  While also try to add Jardiance 10 mg today we have samples of this.  Check fasting labs, continue statin drug

## 2016-11-17 NOTE — Assessment & Plan Note (Signed)
Pressure control medication medication

## 2016-11-17 NOTE — Progress Notes (Signed)
   Subjective:    Patient ID: Sue Wood, female    DOB: 12/04/1945, 71 y.o.   MRN: [id number]  Patient presents for 3 month F/U (is fasting) Patient to follow-up diabetes mellitus. Her last A1c was 8.5%. She states her blood sugars are still up and down this morning was 174 but typically in the evenings is over 200. She is injecting 60 units of Levemir insulin 20 units and NovoLog she is also taking metformin. She has not had any hypoglycemic episodes. 24-hour recall her diet she had toast and coffee for breakfast at lunch she had macaroni and cheese fried chicken green beans a piece of pie and punch, for dinner she had barbecue chicken green beans with potatoes and stuffing    Review Of Systems:  GEN- denies fatigue, fever, weight loss,weakness, recent illness HEENT- denies eye drainage, change in vision, nasal discharge, CVS- denies chest pain, palpitations RESP- denies SOB, cough, wheeze ABD- denies N/V, change in stools, abd pain GU- denies dysuria, hematuria, dribbling, incontinence MSK- denies joint pain, muscle aches, injury Neuro- denies headache, dizziness, syncope, seizure activity       Objective:    BP 130/78   Pulse 82   Temp 98.3 F (36.8 C) (Oral)   Resp 16   Ht 5\' 7"  (1.702 m)   Wt 239 lb (108.4 kg)   SpO2 98%   BMI 37.43 kg/m  GEN- NAD, alert and oriented x3 HEENT- PERRL, EOMI, non injected sclera, pink conjunctiva, MMM, oropharynx cleary CVS- RRR, no murmur RESP-CTAB EXT- chronic left pedal edema Pulses- Radial  2+        Assessment & Plan:      Problem List Items Addressed This Visit    Obesity    Given handouts on portions, carbs in diabetics      Hyperlipidemia   Relevant Orders   Lipid panel   Essential hypertension, benign    Pressure control medication medication      Diabetic neuropathy (HCC)   Diabetes mellitus with neurological manifestation ([place]) - Primary    Diabetes is uncontrolled by colds at least keep her below 8%.  Try to set her up with nutritionist again she's had difficulties with transportation which is why she has not been  While also try to add Jardiance 10 mg today we have samples of this.  Check fasting labs, continue statin drug      Relevant Orders   CBC with Differential/Platelet   Comprehensive metabolic panel   Hemoglobin A1c   Lipid panel      Note: This dictation was prepared with Dragon dictation along with smaller phrase technology. Any transcriptional errors that result from this process are unintentional.

## 2016-11-18 LAB — HEMOGLOBIN A1C
HEMOGLOBIN A1C: 8.4 % — AB (ref <5.7)
MEAN PLASMA GLUCOSE: 194 mg/dL

## 2017-02-11 ENCOUNTER — Other Ambulatory Visit: Payer: Self-pay | Admitting: Family Medicine

## 2017-02-13 ENCOUNTER — Other Ambulatory Visit: Payer: Self-pay | Admitting: *Deleted

## 2017-02-13 MED ORDER — GLUCOSE BLOOD VI STRP: ORAL_STRIP | 12 refills | Status: DC

## 2017-04-01 ENCOUNTER — Encounter: Payer: Self-pay | Admitting: Family Medicine

## 2017-04-01 ENCOUNTER — Ambulatory Visit (INDEPENDENT_AMBULATORY_CARE_PROVIDER_SITE_OTHER): Payer: Medicare HMO | Admitting: Family Medicine

## 2017-04-01 VITALS — BP 132/78 | HR 88 | Temp 98.6°F | Resp 16 | Ht 67.0 in | Wt 239.0 lb

## 2017-04-01 DIAGNOSIS — Z794 Long term (current) use of insulin: Secondary | ICD-10-CM

## 2017-04-01 DIAGNOSIS — I10 Essential (primary) hypertension: Secondary | ICD-10-CM

## 2017-04-01 DIAGNOSIS — E782 Mixed hyperlipidemia: Secondary | ICD-10-CM

## 2017-04-01 DIAGNOSIS — E1141 Type 2 diabetes mellitus with diabetic mononeuropathy: Secondary | ICD-10-CM

## 2017-04-01 DIAGNOSIS — E1143 Type 2 diabetes mellitus with diabetic autonomic (poly)neuropathy: Secondary | ICD-10-CM

## 2017-04-01 LAB — COMPREHENSIVE METABOLIC PANEL
ALBUMIN: 4.1 g/dL (ref 3.6–5.1)
ALT: 13 U/L (ref 6–29)
AST: 14 U/L (ref 10–35)
Alkaline Phosphatase: 89 U/L (ref 33–130)
BILIRUBIN TOTAL: 0.3 mg/dL (ref 0.2–1.2)
BUN: 12 mg/dL (ref 7–25)
CHLORIDE: 102 mmol/L (ref 98–110)
CO2: 22 mmol/L (ref 20–31)
CREATININE: 0.9 mg/dL (ref 0.50–0.99)
Calcium: 9.2 mg/dL (ref 8.6–10.4)
GLUCOSE: 159 mg/dL — AB (ref 70–99)
Potassium: 4.1 mmol/L (ref 3.5–5.3)
SODIUM: 140 mmol/L (ref 135–146)
Total Protein: 7.4 g/dL (ref 6.1–8.1)

## 2017-04-01 LAB — LIPID PANEL
CHOL/HDL RATIO: 3.3 ratio (ref <5.0)
Cholesterol: 177 mg/dL (ref <200)
HDL: 54 mg/dL (ref >50)
LDL Cholesterol: 106 mg/dL — ABNORMAL HIGH (ref <100)
Triglycerides: 86 mg/dL (ref <150)
VLDL: 17 mg/dL (ref <30)

## 2017-04-01 LAB — CBC WITH DIFFERENTIAL/PLATELET
BASOS ABS: 0 {cells}/uL (ref 0–200)
Basophils Relative: 0 %
EOS PCT: 3 %
Eosinophils Absolute: 162 cells/uL (ref 15–500)
HCT: 37.6 % (ref 35.0–45.0)
HEMOGLOBIN: 12.4 g/dL (ref 12.0–15.0)
LYMPHS ABS: 1782 {cells}/uL (ref 850–3900)
LYMPHS PCT: 33 %
MCH: 28.8 pg (ref 27.0–33.0)
MCHC: 33 g/dL (ref 32.0–36.0)
MCV: 87.4 fL (ref 80.0–100.0)
MONOS PCT: 4 %
MPV: 10.6 fL (ref 7.5–12.5)
Monocytes Absolute: 216 cells/uL (ref 200–950)
NEUTROS PCT: 60 %
Neutro Abs: 3240 cells/uL (ref 1500–7800)
PLATELETS: 278 10*3/uL (ref 140–400)
RBC: 4.3 MIL/uL (ref 3.80–5.10)
RDW: 15.6 % — AB (ref 11.0–15.0)
WBC: 5.4 10*3/uL (ref 3.8–10.8)

## 2017-04-01 MED ORDER — EMPAGLIFLOZIN 10 MG PO TABS: 10.0000 mg | ORAL_TABLET | Freq: Every day | ORAL | 2 refills | Status: DC

## 2017-04-01 NOTE — Progress Notes (Signed)
   Subjective:    Patient ID: Sue Wood, female    DOB: 04-15-1946, 71 y.o.   MRN: [id number]  Patient presents for 3 month F/U (is fasting) Here to follow-up chronic medical problems. Diabetes mellitus last A1c 8.4%. She was started on Jardiance in addition to her Levemir insulin in her metformin. She currently injects units of insulin. Her blood sugars avg 193 over past 7 days, after lunch and dinner around 200.  checking 3 times a day , this morning 205.  Goals and trying to get her less than 8% which is been quite difficult. Diet/nutrition has been difficult. She denies any hypoglycemia symptoms.  - due for urine micro  She took samples of Jardiance but never received prescription for Jardiance 10mg    She was referred to diabetic education/nutrition but did not have transportation to go   Hyperlipidemia she is on statin drugs her last LDL was 109 she is on Lipitor 80 mg. She states that she is taking regularly.  Has not started walking     Review Of Systems:  GEN- denies fatigue, fever, weight loss,weakness, recent illness HEENT- denies eye drainage, change in vision, nasal discharge, CVS- denies chest pain, palpitations RESP- denies SOB, cough, wheeze ABD- denies N/V, change in stools, abd pain GU- denies dysuria, hematuria, dribbling, incontinence MSK- denies joint pain, muscle aches, injury Neuro- denies headache, dizziness, syncope, seizure activity       Objective:    BP 132/78   Pulse 88   Temp 98.6 F (37 C) (Oral)   Resp 16   Ht 5\' 7"  (1.702 m)   Wt 239 lb (108.4 kg)   SpO2 98%   BMI 37.43 kg/m  GEN- NAD, alert and oriented x3 HEENT- PERRL, EOMI, non injected sclera, pink conjunctiva, MMM, oropharynx cleary CVS- RRR, no murmur RESP-CTAB EXT- chronic  pedal edema L >R Pulses- Radial  2+         Assessment & Plan:      Problem List Items Addressed This Visit    Diabetic neuropathy ([place]) - Primary   Relevant Medications   empagliflozin  (JARDIANCE) 10 MG TABS tablet   Hyperlipidemia   Relevant Orders   Lipid panel   Essential hypertension, benign    Blood pressure at goal no changes Continue to reiteate the diet component unable to go to nutritionist Will also try to schedule her eye exam      Diabetes mellitus with neurological manifestation (HCC)    CBG have improved,no complaints of neuropathy symptoms Goal A1C < 8% She would still benefit from [place], basically took for 1 month then ran out of meds Restart today Check fasting labs including lipids      Relevant Medications   empagliflozin (JARDIANCE) 10 MG TABS tablet   Other Relevant Orders   CBC with Differential/Platelet   Comprehensive metabolic panel   Hemoglobin A1c   HM DIABETES FOOT EXAM (Completed)   Microalbumin / creatinine urine ratio      Note: This dictation was prepared with Dragon dictation along with smaller phrase technology. Any transcriptional errors that result from this process are unintentional.

## 2017-04-01 NOTE — Assessment & Plan Note (Signed)
CBG have improved,no complaints of neuropathy symptoms Goal A1C < 8% She would still benefit from [place], basically took for 1 month then ran out of meds Restart today Check fasting labs including lipids

## 2017-04-01 NOTE — Patient Instructions (Addendum)
F/U Nov for Physical  Schedule your eye exam

## 2017-04-01 NOTE — Assessment & Plan Note (Addendum)
Blood pressure at goal no changes Continue to reiteate the diet component unable to go to nutritionist Will also try to schedule her eye exam

## 2017-04-02 LAB — HEMOGLOBIN A1C
Hgb A1c MFr Bld: 8.6 % — ABNORMAL HIGH (ref <5.7)
MEAN PLASMA GLUCOSE: 200 mg/dL

## 2017-04-02 LAB — MICROALBUMIN / CREATININE URINE RATIO
CREATININE, URINE: 96 mg/dL (ref 20–320)
MICROALB UR: 3.5 mg/dL
Microalb Creat Ratio: 36 mcg/mg creat — ABNORMAL HIGH (ref <30)

## 2017-04-17 ENCOUNTER — Encounter: Payer: Self-pay | Admitting: *Deleted

## 2017-04-17 ENCOUNTER — Other Ambulatory Visit: Payer: Self-pay | Admitting: *Deleted

## 2017-04-17 MED ORDER — ROSUVASTATIN CALCIUM 20 MG PO TABS: 20.0000 mg | ORAL_TABLET | Freq: Every day | ORAL | 3 refills | Status: DC

## 2017-04-17 MED ORDER — INSULIN DETEMIR 100 UNIT/ML FLEXPEN: 35.0000 [IU] | PEN_INJECTOR | Freq: Two times a day (BID) | SUBCUTANEOUS | 6 refills | Status: DC

## 2017-06-30 ENCOUNTER — Ambulatory Visit (INDEPENDENT_AMBULATORY_CARE_PROVIDER_SITE_OTHER): Payer: Medicare HMO | Admitting: Family Medicine

## 2017-06-30 ENCOUNTER — Encounter: Payer: Self-pay | Admitting: Family Medicine

## 2017-06-30 VITALS — BP 130/82 | HR 82 | Temp 98.9°F | Resp 14 | Ht 67.0 in | Wt 224.0 lb

## 2017-06-30 DIAGNOSIS — E782 Mixed hyperlipidemia: Secondary | ICD-10-CM | POA: Diagnosis not present

## 2017-06-30 DIAGNOSIS — E1141 Type 2 diabetes mellitus with diabetic mononeuropathy: Secondary | ICD-10-CM | POA: Diagnosis not present

## 2017-06-30 DIAGNOSIS — Z6837 Body mass index (BMI) 37.0-37.9, adult: Secondary | ICD-10-CM

## 2017-06-30 DIAGNOSIS — Z23 Encounter for immunization: Secondary | ICD-10-CM | POA: Diagnosis not present

## 2017-06-30 DIAGNOSIS — Z0000 Encounter for general adult medical examination without abnormal findings: Secondary | ICD-10-CM

## 2017-06-30 DIAGNOSIS — M810 Age-related osteoporosis without current pathological fracture: Secondary | ICD-10-CM

## 2017-06-30 DIAGNOSIS — Z1231 Encounter for screening mammogram for malignant neoplasm of breast: Secondary | ICD-10-CM

## 2017-06-30 DIAGNOSIS — I10 Essential (primary) hypertension: Secondary | ICD-10-CM

## 2017-06-30 DIAGNOSIS — Z794 Long term (current) use of insulin: Secondary | ICD-10-CM | POA: Diagnosis not present

## 2017-06-30 DIAGNOSIS — E66812 Obesity, class 2: Secondary | ICD-10-CM

## 2017-06-30 DIAGNOSIS — Z1159 Encounter for screening for other viral diseases: Secondary | ICD-10-CM | POA: Diagnosis not present

## 2017-06-30 DIAGNOSIS — E1143 Type 2 diabetes mellitus with diabetic autonomic (poly)neuropathy: Secondary | ICD-10-CM | POA: Diagnosis not present

## 2017-06-30 DIAGNOSIS — Z1239 Encounter for other screening for malignant neoplasm of breast: Secondary | ICD-10-CM

## 2017-06-30 NOTE — Patient Instructions (Addendum)
Schedule Mammogram Schedule your Bone Density  We will call with lab results  Flu shot given  Schedule eye appointment F/U 4 months

## 2017-06-30 NOTE — Progress Notes (Signed)
Subjective:   Patient presents for Medicare Annual/Subsequent preventive examination.    DM-  Last A1C 8.6%, this AM 189, but CBG have been over 200, no  Hypoglycemia    Levemir 35 units twice a day , metformin, Jardaince  Novolog- 20 units three times a day    HTN-  No cconcerns with Blood rpessure medication    Hyperlipidemia- taking Crestor , LDL  106 in July   Review Past Medical/Family/Social: Per EMR    Risk Factors  Current exercise habits: little walking  Dietary issues discussed: YES  Cardiac risk factors: Obesity (BMI >= 30 kg/m2). DM, HTN  Depression Screen  (Note: if answer to either of the following is "Yes", a more complete depression screening is indicated)  Over the past two weeks, have you felt down, depressed or hopeless? No Over the past two weeks, have you felt little interest or pleasure in doing things? No Have you lost interest or pleasure in daily life? No Do you often feel hopeless? No Do you cry easily over simple problems? No   Activities of Daily Living  In your present state of health, do you have any difficulty performing the following activities?:  Driving? No  Managing money? No  Feeding yourself? No  Getting from bed to chair? No  Climbing a flight of stairs? No  Preparing food and eating?: No  Bathing or showering? No  Getting dressed: No  Getting to the toilet? No  Using the toilet:No  Moving around from place to place: No  In the past year have you fallen or had a near fall?:No  Are you sexually active? No  Do you have more than one partner? No   Hearing Difficulties: No  Do you often ask people to speak up or repeat themselves? No  Do you experience ringing or noises in your ears? No Do you have difficulty understanding soft or whispered voices? No  Do you feel that you have a problem with memory? No Do you often misplace items? No  Do you feel safe at home? Yes  Cognitive Testing  Alert? Yes Normal Appearance?Yes  Oriented  to person? Yes Place? Yes  Time? Yes  Recall of three objects? Yes  Can perform simple calculations? Yes  Displays appropriate judgment?Yes  Can read the correct time from a watch face?Yes   List the Names of Other Physician/Practitioners you currently use:   [place] doctor   Screening Tests / Date Colonoscopy   UTD                   Zostavax  Declines  Mammogram Due  Influenza Vaccine Due  Pneumonia- UTD Tetanus/tdap- UTD Bone Density- 2016 due for repeat  ROS:  GEN- denies fatigue, fever, weight loss,weakness, recent illness HEENT- denies eye drainage, change in vision, nasal discharge, CVS- denies chest pain, palpitations RESP- denies SOB, cough, wheeze ABD- denies N/V, change in stools, abd pain GU- denies dysuria, hematuria, dribbling, incontinence MSK- denies joint pain, muscle aches, injury Neuro- denies headache, dizziness, syncope, seizure activity  PHYSICAL: GEN- NAD, alert and oriented x3 HEENT- PERRL, EOMI, non injected sclera, pink conjunctiva, MMM, oropharynx clear Neck- Supple, no thryomegaly CVS- RRR, no murmur RESP-CTAB ABD-NABS,soft, NT,ND EXT- No edema, bunions bilat  Pulses- Radial, DP- 2+   Assessment:    Annual wellness medicare exam   Plan:    During the course of the visit the patient was educated and counseled about appropriate screening and preventive services including:  Screening mammography /Bone Density- has osteoporosis, taking calcium Vitamin D, was on fosamax in past   Discussed Hep C screening  Flu shot given   Screen Neg  for depression.    Discussed Advanced directives- handout given    DM- goal A1C less than 8%, no change to insulin recheck levels, she has at least kept A1c stable around 8.5%, no hypoglycemia symptoms  She is scheduling eye exam , now has transportation  HTN- well controlled   Hyperlipidemia- on Crestor statin drug    Diet review for nutrition referral? Yes ____ Not Indicated __x__    Referral made multiple times, before, due to transportation can not go  Patient Instructions (the written plan) was given to the patient.  Medicare Attestation  I have personally reviewed:  The patient's medical and social history  Their use of alcohol, tobacco or illicit drugs  Their current medications and supplements  The patient's functional ability including ADLs,fall risks, home safety risks, cognitive, and hearing and visual impairment  Diet and physical activities  Evidence for depression or mood disorders  The patient's weight, height, BMI, and visual acuity have been recorded in the chart. I have made referrals, counseling, and provided education to the patient based on review of the above and I have provided the patient with a written personalized care plan for preventive services.

## 2017-06-30 NOTE — Assessment & Plan Note (Signed)
Uncontrolled goals to get her A1c consistently less than 8%.  Her fasting blood sugars have improved. Her labs today.  She will continue with her statin drug as well as her blood pressure medications. Recent urine microalbumin which was good.  She now is able to schedule visit with her ophthalmologist. Shot given today in office   Pressure controlled

## 2017-07-01 LAB — COMPREHENSIVE METABOLIC PANEL WITH GFR
AG Ratio: 1.3 (calc) (ref 1.0–2.5)
ALT: 32 U/L — ABNORMAL HIGH (ref 6–29)
AST: 18 U/L (ref 10–35)
Albumin: 4 g/dL (ref 3.6–5.1)
Alkaline phosphatase (APISO): 67 U/L (ref 33–130)
BUN: 16 mg/dL (ref 7–25)
CO2: 20 mmol/L (ref 20–32)
Calcium: 9.3 mg/dL (ref 8.6–10.4)
Chloride: 100 mmol/L (ref 98–110)
Creat: 0.91 mg/dL (ref 0.50–0.99)
Globulin: 3 g/dL (ref 1.9–3.7)
Glucose, Bld: 490 mg/dL — ABNORMAL HIGH (ref 65–99)
Potassium: 4.8 mmol/L (ref 3.5–5.3)
Sodium: 136 mmol/L (ref 135–146)
Total Bilirubin: 0.4 mg/dL (ref 0.2–1.2)
Total Protein: 7 g/dL (ref 6.1–8.1)

## 2017-07-01 LAB — LIPID PANEL
Cholesterol: 259 mg/dL — ABNORMAL HIGH (ref <200)
HDL: 49 mg/dL — ABNORMAL LOW (ref >50)
LDL Cholesterol (Calc): 178 mg/dL (calc) — ABNORMAL HIGH
Non-HDL Cholesterol (Calc): 210 mg/dL (calc) — ABNORMAL HIGH (ref <130)
TRIGLYCERIDES: 168 mg/dL — AB (ref <150)
Total CHOL/HDL Ratio: 5.3 (calc) — ABNORMAL HIGH (ref <5.0)

## 2017-07-01 LAB — CBC WITH DIFFERENTIAL/PLATELET
BASOS ABS: 20 {cells}/uL (ref 0–200)
Basophils Relative: 0.4 %
EOS PCT: 1 %
Eosinophils Absolute: 49 cells/uL (ref 15–500)
HCT: 38.8 % (ref 35.0–45.0)
HEMOGLOBIN: 12.6 g/dL (ref 11.7–15.5)
Lymphs Abs: 1201 cells/uL (ref 850–3900)
MCH: 28.4 pg (ref 27.0–33.0)
MCHC: 32.5 g/dL (ref 32.0–36.0)
MCV: 87.4 fL (ref 80.0–100.0)
MONOS PCT: 3.7 %
MPV: 12.7 fL — ABNORMAL HIGH (ref 7.5–12.5)
NEUTROS ABS: 3450 {cells}/uL (ref 1500–7800)
NEUTROS PCT: 70.4 %
Platelets: 269 10*3/uL (ref 140–400)
RBC: 4.44 10*6/uL (ref 3.80–5.10)
RDW: 13.5 % (ref 11.0–15.0)
Total Lymphocyte: 24.5 %
WBC mixed population: 181 cells/uL — ABNORMAL LOW (ref 200–950)
WBC: 4.9 10*3/uL (ref 3.8–10.8)

## 2017-07-01 LAB — HEMOGLOBIN A1C
Hgb A1c MFr Bld: 13.1 %{Hb} — ABNORMAL HIGH
Mean Plasma Glucose: 329 (calc)
eAG (mmol/L): 18.2 (calc)

## 2017-07-01 LAB — HEPATITIS C ANTIBODY
HEP C AB: NONREACTIVE
SIGNAL TO CUT-OFF: 0.02 (ref <1.00)

## 2017-07-02 ENCOUNTER — Other Ambulatory Visit: Payer: Self-pay | Admitting: Family Medicine

## 2017-07-02 DIAGNOSIS — R928 Other abnormal and inconclusive findings on diagnostic imaging of breast: Secondary | ICD-10-CM

## 2017-07-04 ENCOUNTER — Encounter (HOSPITAL_COMMUNITY): Payer: Self-pay | Admitting: Emergency Medicine

## 2017-07-04 ENCOUNTER — Emergency Department (HOSPITAL_COMMUNITY)
Admission: EM | Admit: 2017-07-04 | Discharge: 2017-07-04 | Disposition: A | Discharge status: Home / Self Care | Payer: Medicare HMO | Attending: Emergency Medicine | Admitting: Emergency Medicine

## 2017-07-04 DIAGNOSIS — Z87891 Personal history of nicotine dependence: Secondary | ICD-10-CM | POA: Insufficient documentation

## 2017-07-04 DIAGNOSIS — Z794 Long term (current) use of insulin: Secondary | ICD-10-CM | POA: Diagnosis not present

## 2017-07-04 DIAGNOSIS — I10 Essential (primary) hypertension: Secondary | ICD-10-CM | POA: Diagnosis not present

## 2017-07-04 DIAGNOSIS — Z7982 Long term (current) use of aspirin: Secondary | ICD-10-CM | POA: Diagnosis not present

## 2017-07-04 DIAGNOSIS — E119 Type 2 diabetes mellitus without complications: Secondary | ICD-10-CM | POA: Insufficient documentation

## 2017-07-04 DIAGNOSIS — Z79899 Other long term (current) drug therapy: Secondary | ICD-10-CM | POA: Insufficient documentation

## 2017-07-04 DIAGNOSIS — M542 Cervicalgia: Secondary | ICD-10-CM | POA: Diagnosis not present

## 2017-07-04 DIAGNOSIS — G8929 Other chronic pain: Secondary | ICD-10-CM | POA: Diagnosis not present

## 2017-07-04 MED ORDER — HYDROCODONE-ACETAMINOPHEN 5-325 MG PO TABS
1.0000 | ORAL_TABLET | Freq: Once | ORAL | Status: AC
  Administered 2017-07-04: 1 via ORAL
  Filled 2017-07-04: qty 1

## 2017-07-04 MED ORDER — LIDOCAINE 5 % EX PTCH
1.0000 | MEDICATED_PATCH | CUTANEOUS | Status: DC
  Administered 2017-07-04: 1 via TRANSDERMAL
  Filled 2017-07-04 (×3): qty 1

## 2017-07-04 MED ORDER — METHOCARBAMOL 500 MG PO TABS: 500.0000 mg | ORAL_TABLET | Freq: Every evening | ORAL | 0 refills | Status: DC | PRN

## 2017-07-04 MED ORDER — DICLOFENAC SODIUM 3 % TD GEL: 1.0000 "application " | Freq: Three times a day (TID) | TRANSDERMAL | 0 refills | Status: DC | PRN

## 2017-07-04 NOTE — ED Triage Notes (Signed)
Pt states that she has been having neck pain that radiates to her left shoulder.  Pt states that she has been experiencing this for the past 2 weeks and has been progressively getting worse.

## 2017-07-04 NOTE — ED Triage Notes (Signed)
Back shoulder and neck pain for 2 weeks  Has PCP but has not followed

## 2017-07-04 NOTE — ED Provider Notes (Signed)
[facility] EMERGENCY DEPARTMENT Provider Note   CSN: [id number] Arrival date & time: 07/04/17  1630     History   Chief Complaint No chief complaint on file.   HPI Sue Wood is a 71 y.o. female presenting with gradual onset intermittent sharp shooting neck pain neck pain radiating down her left shoulder.  Patient reports that she has been having this since the early 2000 and was told that there was nothing to be concerned about. She was seen 4 days ago by her PCP and did not mention this problem. She reports that over the past couple of weeks this has been getting worse. It is aggravated by turning her head to the left or going up steps and cleaning her house. She has tried icy hot and heat packs which relieves the pain. She states that when she tries to resume her activity the pain returns. No chest pain, shortness of breath, diaphoresis, nausea, vomiting or any other symptoms.  HPI  Past Medical History:  Diagnosis Date  . Arthritis   . Diabetes mellitus   . Hyperlipidemia   . Hypertension   . Peripheral neuropathy     Patient Active Problem List   Diagnosis Date Noted  . Osteoporosis 06/30/2017  . Osteoarthritis, knee 12/16/2012  . Diabetes mellitus with neurological manifestation ([place]) 12/31/2011  . Essential hypertension, benign 12/31/2011  . Hyperlipidemia 12/31/2011  . Bilateral bunions 12/31/2011  . Diabetic neuropathy ([place]) 12/31/2011  . Obesity 12/31/2011    Past Surgical History:  Procedure Laterality Date  . ABDOMINAL HYSTERECTOMY    . CESAREAN SECTION     X 2  . COLONOSCOPY N/A 12/08/2013   Procedure: COLONOSCOPY;  Surgeon: Rogene Houston, MD;  Location: [facility];  Service: Endoscopy;  Laterality: N/A;  930  . KNEE ARTHROSCOPY  1998   left    OB History    No data available       Home Medications    Prior to Admission medications   Medication Sig Start Date End Date Taking? Authorizing Provider  ACCU-CHEK SOFTCLIX LANCETS lancets  USE AS INSTRUCTED 02/11/17   Alycia Rossetti, MD  Alcohol Swabs 70 % PADS Use 1-4 times daily prn 02/12/16   Alycia Rossetti, MD  alendronate (FOSAMAX) 70 MG tablet Take 1 tablet (70 mg total) by mouth every 7 (seven) days. Take with a full glass of water on an empty stomach. 02/12/16   Alycia Rossetti, MD  amitriptyline (ELAVIL) 50 MG tablet TAKE 1 TABLET (50 MG TOTAL) BY MOUTH AT BEDTIME. 10/30/16   [place], Modena Nunnery, MD  amLODipine (NORVASC) 10 MG tablet TAKE 1 TABLET (10 MG TOTAL) BY MOUTH DAILY. 10/30/16   Alycia Rossetti, MD  aspirin 81 MG tablet Take 1 tablet (81 mg total) by mouth at bedtime. 02/12/16   Alycia Rossetti, MD  Blood Glucose Calibration (ACCU-CHEK AVIVA) SOLN Check glucose 1-4 times a day prn. 02/12/16   Alycia Rossetti, MD  Blood Glucose Monitoring Suppl (ACCU-CHEK AVIVA PLUS) w/Device KIT Check glucose 1-4 times a day prn. 02/12/16   Alycia Rossetti, MD  Diclofenac Sodium 3 % GEL Place 1 application onto the skin 3 (three) times daily as needed. 07/04/17   Avie Echevaria B, PA-C  empagliflozin (JARDIANCE) 10 MG TABS tablet Take 10 mg by mouth daily. 04/01/17   Alycia Rossetti, MD  glucose blood (ACCU-CHEK AVIVA PLUS) test strip Use as instructed to monitor FSBS 2x daily. Dx: E11.9. 02/13/17  [place], Modena Nunnery, MD  Insulin Detemir (LEVEMIR FLEXTOUCH) 100 UNIT/ML Pen Inject 35 Units into the skin 2 (two) times daily. 04/17/17   Alycia Rossetti, MD  metFORMIN (GLUCOPHAGE) 500 MG tablet Take 2 tablets (1,000 mg total) by mouth 2 (two) times daily with a meal. 02/12/16   [place], Modena Nunnery, MD  methocarbamol (ROBAXIN) 500 MG tablet Take 1 tablet (500 mg total) by mouth at bedtime as needed for muscle spasms. 07/04/17   Avie Echevaria B, PA-C  naproxen (NAPROSYN) 500 MG tablet TAKE 1 TABLET TWICE DAILY WITH MEALS 10/30/16   [place], Modena Nunnery, MD  NOVOLOG FLEXPEN 100 UNIT/ML FlexPen INJECT 20 UNITS SUBCUTANEOUSLY THREE TIMES DAILY WITH FOOD 02/11/17   Alycia Rossetti, MD    rosuvastatin (CRESTOR) 20 MG tablet Take 1 tablet (20 mg total) by mouth daily. 04/17/17   Alycia Rossetti, MD    Family History Family History  Problem Relation Age of Onset  . Hypertension Mother   . Hyperlipidemia Mother   . Hypertension Father   . Hyperlipidemia Father   . Diabetes Father   . Colon cancer Neg Hx     Social History Social History  Substance Use Topics  . Smoking status: Former Smoker    Packs/day: 0.25    Years: 18.00    Types: Cigarettes  . Smokeless tobacco: Never Used     Comment: quit 1980's  . Alcohol use No     Allergies   Lisinopril   Review of Systems Review of Systems  Constitutional: Negative for chills, diaphoresis and fever.  Eyes: Negative for visual disturbance.  Respiratory: Negative for chest tightness, shortness of breath, wheezing and stridor.   Cardiovascular: Negative for chest pain and palpitations.  Gastrointestinal: Negative for nausea and vomiting.  Musculoskeletal: Positive for myalgias and neck pain. Negative for arthralgias, back pain, gait problem, joint swelling and neck stiffness.  Skin: Negative for color change and pallor.  Neurological: Negative for dizziness, syncope, facial asymmetry, speech difficulty, weakness, light-headedness, numbness and headaches.     Physical Exam Updated Vital Signs BP (!) 174/91 (BP Location: Right Arm)   Pulse 82   Temp 98.5 F (36.9 C) (Oral)   Resp 18   Ht _0  (1.702 m)   Wt 101.6 kg (224 lb)   SpO2 98%   BMI 35.08 kg/m   Physical Exam  Constitutional: She is oriented to person, place, and time. She appears well-developed and well-nourished. No distress.  HENT:  Head: Normocephalic and atraumatic.  Eyes: Conjunctivae and EOM are normal.  Neck: Normal range of motion. Neck supple.  Cardiovascular: Normal rate, regular rhythm, normal heart sounds and intact distal pulses.   No murmur heard. Pulmonary/Chest: Effort normal and breath sounds normal. No stridor. No  respiratory distress. She has no wheezes. She has no rales. She exhibits no tenderness.  Musculoskeletal: Normal range of motion. She exhibits tenderness. She exhibits no edema or deformity.  No midline tenderness palpation of the spine. Patient reports tenderness palpation at the left trapezius muscle along the neck.  Neurological: She is alert and oriented to person, place, and time. No sensory deficit. She exhibits normal muscle tone.  5 out of 5 strength to flexion and extension at the elbow flexion and extension abduction adduction of the shoulders bilaterally. Strong grips. Intact radial pulses. I'm out of 5 strength to the sternocleidomastoid bilaterally.  Skin: Skin is warm and dry. Capillary refill takes less than 2 seconds. No rash noted. She is not diaphoretic.  No erythema. No pallor.  Psychiatric: She has a normal mood and affect.  Nursing note and vitals reviewed.    ED Treatments / Results  Labs (all labs ordered are listed, but only abnormal results are displayed) Labs Reviewed - No data to display  EKG  EKG Interpretation None       Radiology No results found.  Procedures Procedures (including critical care time)  Medications Ordered in ED Medications  lidocaine (LIDODERM) 5 % 1 patch (1 patch Transdermal Patch Applied 07/04/17 1758)  HYDROcodone-acetaminophen (NORCO/VICODIN) 5-325 MG per tablet 1 tablet (1 tablet Oral Given 07/04/17 1728)     Initial Impression / Assessment and Plan / ED Course  I have reviewed the triage vital signs and the nursing notes.  Pertinent labs & imaging results that were available during my care of the patient were reviewed by me and considered in my medical decision making (see chart for details).    Patient presents with chronic neck pain and some worsening over the last 2 weeks. Patient's upper extremities 5 out of 5 strength and neurovascularly intact. No injury or trauma.  Patient significantly improved while in the  emergency department.   Discharge home with symptomatic relief and close follow-up with PCP. Urged patient to mention this chronic issue to her primary care for follow-up.  Advised patient to avoid driving or operating machinery while on this medication.  Discussed strict return precautions and advised to return to the emergency department if experiencing any new or worsening symptoms. Instructions were understood and patient agreed with discharge plan.  Final Clinical Impressions(s) / ED Diagnoses   Final diagnoses:  Neck pain, chronic    New Prescriptions Discharge Medication List as of 07/04/2017  6:28 PM    START taking these medications   Details  Diclofenac Sodium 3 % GEL Place 1 application onto the skin 3 (three) times daily as needed., Starting Sat 07/04/2017, Print    methocarbamol (ROBAXIN) 500 MG tablet Take 1 tablet (500 mg total) by mouth at bedtime as needed for muscle spasms., Starting Sat 07/04/2017, Print         Emeline General, PA-C 07/04/17 1856    Julianne Rice, MD 07/05/17 [phone]

## 2017-07-04 NOTE — Discharge Instructions (Signed)
As discussed, apply gel to the affected area and take muscle relaxant as needed for pain and spasm at bedtime. Do not drive or operate machinery while on this medication. Follow up with your primary care provider if symptoms persist.

## 2017-07-09 ENCOUNTER — Encounter: Payer: Self-pay | Admitting: Family Medicine

## 2017-07-15 ENCOUNTER — Ambulatory Visit: Payer: Self-pay | Admitting: Family Medicine

## 2017-07-20 ENCOUNTER — Encounter (HOSPITAL_COMMUNITY): Payer: Medicare HMO

## 2017-07-20 ENCOUNTER — Other Ambulatory Visit (HOSPITAL_COMMUNITY): Payer: Medicare HMO

## 2018-04-20 ENCOUNTER — Other Ambulatory Visit: Payer: Self-pay

## 2018-04-20 ENCOUNTER — Encounter: Payer: Self-pay | Admitting: Family Medicine

## 2018-04-20 ENCOUNTER — Ambulatory Visit (INDEPENDENT_AMBULATORY_CARE_PROVIDER_SITE_OTHER): Payer: Medicare Other | Admitting: Family Medicine

## 2018-04-20 VITALS — BP 132/70 | HR 70 | Temp 98.4°F | Resp 14 | Ht 67.0 in | Wt 217.0 lb

## 2018-04-20 DIAGNOSIS — I10 Essential (primary) hypertension: Secondary | ICD-10-CM

## 2018-04-20 DIAGNOSIS — Z6833 Body mass index (BMI) 33.0-33.9, adult: Secondary | ICD-10-CM

## 2018-04-20 DIAGNOSIS — E1141 Type 2 diabetes mellitus with diabetic mononeuropathy: Secondary | ICD-10-CM | POA: Diagnosis not present

## 2018-04-20 DIAGNOSIS — E782 Mixed hyperlipidemia: Secondary | ICD-10-CM

## 2018-04-20 DIAGNOSIS — E1143 Type 2 diabetes mellitus with diabetic autonomic (poly)neuropathy: Secondary | ICD-10-CM | POA: Diagnosis not present

## 2018-04-20 DIAGNOSIS — E6609 Other obesity due to excess calories: Secondary | ICD-10-CM

## 2018-04-20 DIAGNOSIS — M810 Age-related osteoporosis without current pathological fracture: Secondary | ICD-10-CM

## 2018-04-20 DIAGNOSIS — Z794 Long term (current) use of insulin: Secondary | ICD-10-CM

## 2018-04-20 MED ORDER — AMLODIPINE BESYLATE 10 MG PO TABS: 10.0000 mg | ORAL_TABLET | Freq: Every day | ORAL | 1 refills | Status: DC

## 2018-04-20 MED ORDER — ROSUVASTATIN CALCIUM 20 MG PO TABS: 20.0000 mg | ORAL_TABLET | Freq: Every day | ORAL | 3 refills | Status: DC

## 2018-04-20 MED ORDER — AMITRIPTYLINE HCL 50 MG PO TABS: 50.0000 mg | ORAL_TABLET | Freq: Every day | ORAL | 1 refills | Status: DC

## 2018-04-20 MED ORDER — METFORMIN HCL 500 MG PO TABS: 1000.0000 mg | ORAL_TABLET | Freq: Two times a day (BID) | ORAL | 1 refills | Status: DC

## 2018-04-20 MED ORDER — BLOOD GLUCOSE SYSTEM PAK KIT: PACK | 1 refills | Status: DC

## 2018-04-20 MED ORDER — ALENDRONATE SODIUM 70 MG PO TABS: 70.0000 mg | ORAL_TABLET | ORAL | 6 refills | Status: DC

## 2018-04-20 MED ORDER — INSULIN LISPRO 100 UNIT/ML (KWIKPEN): PEN_INJECTOR | SUBCUTANEOUS | 11 refills | Status: DC

## 2018-04-20 MED ORDER — EMPAGLIFLOZIN 10 MG PO TABS: 10.0000 mg | ORAL_TABLET | Freq: Every day | ORAL | 2 refills | Status: DC

## 2018-04-20 MED ORDER — INSULIN DETEMIR 100 UNIT/ML FLEXPEN: 35.0000 [IU] | PEN_INJECTOR | Freq: Two times a day (BID) | SUBCUTANEOUS | 6 refills | Status: DC

## 2018-04-20 MED ORDER — LANCETS MISC: 11 refills | Status: DC

## 2018-04-20 MED ORDER — BLOOD GLUCOSE TEST VI STRP: ORAL_STRIP | 11 refills | Status: DC

## 2018-04-20 NOTE — Assessment & Plan Note (Signed)
Continue elavil  °

## 2018-04-20 NOTE — Patient Instructions (Addendum)
F/U Wellness exam- with physician assistant in 3 months  F/U 3 weeks for diabetes - BRING YOUR METER  New meter sent to pharmacy  Medications sent to [facility] Humalog is the new meal time insulin  Schedule your mammogram and Bone density

## 2018-04-20 NOTE — Assessment & Plan Note (Signed)
Uncontrolled, off oral medications , changed novolog to humalog due to insurance coverage Goal A1C  <8%, has been quite some time since she has been controlled Expect A1C to be very high today  Will have her return in 3 weeks with all her meds, new meter and continue titrating insulin

## 2018-04-20 NOTE — Progress Notes (Signed)
Subjective:    Patient ID: Sue Wood, female    DOB: 08-31-1946, 72 y.o.   MRN: [id number]  Patient presents for Follow-up (is fasting) Here to follow-up diabetes mellitus.  Her last visit was in October 2018 which was her last physical She never had mammogram or bone density done Diabetes mellitus has been uncontrolled her last A1c was 13.1% her last doses were Levemir 40 units twice a day along with NovoLog sliding scale metformin and Jardiance.  CBG range  Diabetic neuropathy on elavil She has not been able to go to EYE doctor in years  Due for urine micro     She states medicare was cut off accidentally in Oct, her meds have been messed up since then, insurance came back in Dec, currently just has insulin Levemir and Novlog, no other oral medications She has been doing 35 units BID of levemir and Novolog 15 meals Was buying test strips  Told Novolog was not covered needs Humalog   Hyperlipidemia- on crestor  Last LDL  178 and TC 259   HTN- taking BP meds as prescribed  Weight down 7lbs since visit in [place]:  GEN- denies fatigue, fever, weight loss,weakness, recent illness HEENT- denies eye drainage, change in vision, nasal discharge, CVS- denies chest pain, palpitations RESP- denies SOB, cough, wheeze ABD- denies N/V, change in stools, abd pain GU- denies dysuria, hematuria, dribbling, incontinence MSK- denies joint pain, muscle aches, injury Neuro- denies headache, dizziness, syncope, seizure activity       Objective:    BP 132/70   Pulse 70   Temp 98.4 F (36.9 C) (Oral)   Resp 14   Ht 5\' 7"  (1.702 m)   Wt 217 lb (98.4 kg)   SpO2 98%   BMI 33.99 kg/m  GEN- NAD, alert and oriented x3 HEENT- PERRL, EOMI, non injected sclera, pink conjunctiva, MMM, oropharynx clear Neck- Supple, no thyromegaly CVS- RRR, no murmur RESP-CTAB ABD-NABS,soft,NT,ND EXT- No edema, bunions bilat  Pulses- Radial, DP- 2+        Assessment & Plan:       Problem List Items Addressed This Visit      Unprioritized   Diabetes mellitus with neurological manifestation (HCC)    Uncontrolled, off oral medications , changed novolog to humalog due to insurance coverage Goal A1C  <8%, has been quite some time since she has been controlled Expect A1C to be very high today  Will have her return in 3 weeks with all her meds, new meter and continue titrating insulin      Relevant Medications   insulin lispro (HUMALOG KWIKPEN) 100 UNIT/ML KiwkPen   rosuvastatin (CRESTOR) 20 MG tablet   metFORMIN (GLUCOPHAGE) 500 MG tablet   Insulin Detemir (LEVEMIR FLEXTOUCH) 100 UNIT/ML Pen   empagliflozin (JARDIANCE) 10 MG TABS tablet   Other Relevant Orders   HM DIABETES FOOT EXAM (Completed)   Microalbumin / creatinine urine ratio   CBC with Differential/Platelet   Comprehensive metabolic panel   Hemoglobin A1c   Lipid panel   Diabetic neuropathy (HCC)    Continue elavil      Relevant Medications   insulin lispro (HUMALOG KWIKPEN) 100 UNIT/ML KiwkPen   rosuvastatin (CRESTOR) 20 MG tablet   metFORMIN (GLUCOPHAGE) 500 MG tablet   Insulin Detemir (LEVEMIR FLEXTOUCH) 100 UNIT/ML Pen   empagliflozin (JARDIANCE) 10 MG TABS tablet   Essential hypertension, benign    BP looks okay, no changes to meds  Relevant Medications   rosuvastatin (CRESTOR) 20 MG tablet   amLODipine (NORVASC) 10 MG tablet   Hyperlipidemia - Primary    Continue crestor, check lipids, LDL goal < 100       Relevant Medications   rosuvastatin (CRESTOR) 20 MG tablet   amLODipine (NORVASC) 10 MG tablet   Other Relevant Orders   Lipid panel   Obesity   Relevant Medications   insulin lispro (HUMALOG KWIKPEN) 100 UNIT/ML KiwkPen   metFORMIN (GLUCOPHAGE) 500 MG tablet   Insulin Detemir (LEVEMIR FLEXTOUCH) 100 UNIT/ML Pen   empagliflozin (JARDIANCE) 10 MG TABS tablet   Osteoporosis    She is on fosamax, overdue for repeat bone density,discussed scheduling this and  mammogram      Relevant Medications   alendronate (FOSAMAX) 70 MG tablet      Note: This dictation was prepared with Dragon dictation along with smaller phrase technology. Any transcriptional errors that result from this process are unintentional.

## 2018-04-20 NOTE — Assessment & Plan Note (Addendum)
She is on fosamax, overdue for repeat bone density,discussed scheduling this and mammogram

## 2018-04-20 NOTE — Assessment & Plan Note (Signed)
BP looks okay, no changes to meds

## 2018-04-20 NOTE — Assessment & Plan Note (Signed)
Continue crestor, check lipids, LDL goal < 100

## 2018-04-21 LAB — COMPREHENSIVE METABOLIC PANEL
AG Ratio: 1.3 (calc) (ref 1.0–2.5)
ALKALINE PHOSPHATASE (APISO): 76 U/L (ref 33–130)
ALT: 13 U/L (ref 6–29)
AST: 15 U/L (ref 10–35)
Albumin: 4.1 g/dL (ref 3.6–5.1)
BILIRUBIN TOTAL: 0.3 mg/dL (ref 0.2–1.2)
BUN: 13 mg/dL (ref 7–25)
CALCIUM: 9.5 mg/dL (ref 8.6–10.4)
CHLORIDE: 105 mmol/L (ref 98–110)
CO2: 24 mmol/L (ref 20–32)
Creat: 0.78 mg/dL (ref 0.60–0.93)
GLOBULIN: 3.1 g/dL (ref 1.9–3.7)
Glucose, Bld: 139 mg/dL — ABNORMAL HIGH (ref 65–99)
Potassium: 4.1 mmol/L (ref 3.5–5.3)
Sodium: 139 mmol/L (ref 135–146)
Total Protein: 7.2 g/dL (ref 6.1–8.1)

## 2018-04-21 LAB — MICROALBUMIN / CREATININE URINE RATIO
CREATININE, URINE: 81 mg/dL (ref 20–275)
Microalb Creat Ratio: 54 mcg/mg creat — ABNORMAL HIGH (ref <30)
Microalb, Ur: 4.4 mg/dL

## 2018-04-21 LAB — CBC WITH DIFFERENTIAL/PLATELET
Basophils Absolute: 51 cells/uL (ref 0–200)
Basophils Relative: 1.3 %
EOS ABS: 82 {cells}/uL (ref 15–500)
Eosinophils Relative: 2.1 %
HCT: 36.4 % (ref 35.0–45.0)
Hemoglobin: 12 g/dL (ref 11.7–15.5)
Lymphs Abs: 1365 cells/uL (ref 850–3900)
MCH: 28.4 pg (ref 27.0–33.0)
MCHC: 33 g/dL (ref 32.0–36.0)
MCV: 86.1 fL (ref 80.0–100.0)
MPV: 12.1 fL (ref 7.5–12.5)
Monocytes Relative: 5.4 %
NEUTROS PCT: 56.2 %
Neutro Abs: 2192 cells/uL (ref 1500–7800)
PLATELETS: 256 10*3/uL (ref 140–400)
RBC: 4.23 10*6/uL (ref 3.80–5.10)
RDW: 13.7 % (ref 11.0–15.0)
TOTAL LYMPHOCYTE: 35 %
WBC mixed population: 211 cells/uL (ref 200–950)
WBC: 3.9 10*3/uL (ref 3.8–10.8)

## 2018-04-21 LAB — LIPID PANEL
Cholesterol: 288 mg/dL — ABNORMAL HIGH (ref <200)
HDL: 52 mg/dL (ref >50)
LDL Cholesterol (Calc): 213 mg/dL (calc) — ABNORMAL HIGH
NON-HDL CHOLESTEROL (CALC): 236 mg/dL — AB (ref <130)
Total CHOL/HDL Ratio: 5.5 (calc) — ABNORMAL HIGH (ref <5.0)
Triglycerides: 98 mg/dL (ref <150)

## 2018-04-21 LAB — HEMOGLOBIN A1C
HEMOGLOBIN A1C: 9.1 %{Hb} — AB (ref <5.7)
MEAN PLASMA GLUCOSE: 214 (calc)
eAG (mmol/L): 11.9 (calc)

## 2018-04-22 ENCOUNTER — Encounter: Payer: Self-pay | Admitting: *Deleted

## 2018-05-03 ENCOUNTER — Other Ambulatory Visit: Payer: Self-pay | Admitting: *Deleted

## 2018-05-03 MED ORDER — AMITRIPTYLINE HCL 50 MG PO TABS: 50.0000 mg | ORAL_TABLET | Freq: Every day | ORAL | 1 refills | Status: DC

## 2018-05-03 MED ORDER — AMLODIPINE BESYLATE 10 MG PO TABS: 10.0000 mg | ORAL_TABLET | Freq: Every day | ORAL | 1 refills | Status: DC

## 2018-05-03 MED ORDER — ROSUVASTATIN CALCIUM 20 MG PO TABS: 20.0000 mg | ORAL_TABLET | Freq: Every day | ORAL | 3 refills | Status: DC

## 2018-05-03 MED ORDER — INSULIN LISPRO 100 UNIT/ML (KWIKPEN): PEN_INJECTOR | SUBCUTANEOUS | 3 refills | Status: DC

## 2018-05-03 MED ORDER — EMPAGLIFLOZIN 10 MG PO TABS: 10.0000 mg | ORAL_TABLET | Freq: Every day | ORAL | 2 refills | Status: DC

## 2018-05-11 ENCOUNTER — Ambulatory Visit (INDEPENDENT_AMBULATORY_CARE_PROVIDER_SITE_OTHER): Payer: Medicare Other | Admitting: Family Medicine

## 2018-05-11 VITALS — BP 136/90 | HR 72 | Temp 98.4°F | Resp 16 | Ht 67.0 in | Wt 219.0 lb

## 2018-05-11 DIAGNOSIS — Z794 Long term (current) use of insulin: Secondary | ICD-10-CM | POA: Diagnosis not present

## 2018-05-11 DIAGNOSIS — E1143 Type 2 diabetes mellitus with diabetic autonomic (poly)neuropathy: Secondary | ICD-10-CM

## 2018-05-11 DIAGNOSIS — Z23 Encounter for immunization: Secondary | ICD-10-CM | POA: Diagnosis not present

## 2018-05-11 MED ORDER — TRIAMCINOLONE ACETONIDE 0.1 % EX CREA: 1.0000 "application " | TOPICAL_CREAM | Freq: Two times a day (BID) | CUTANEOUS | 0 refills | Status: DC

## 2018-05-11 NOTE — Progress Notes (Signed)
Subjective:    Patient ID: Sue Wood, female    DOB: 1945-12-28, 72 y.o.   MRN: [id number]  HPI Patient is here today for follow-up of her diabetes.  She is currently on a combination of Levemir 35 units twice a day and Humalog 10 to 20 units with meals.  However it seems like every meal she is taking 10 units regardless of what her sugars are ranging.  Her morning sugars tend to be between 101 180.  Her lunchtime sugars tend to be between 250 and 350.  Her suppertime sugars tend to be between 250 and 300.  These are checked right at the time she is eating.  She denies any hypoglycemia.  She is compliant with her insulin.  She is also taking her oral medications listed below Past Medical History:  Diagnosis Date  . Arthritis   . Diabetes mellitus   . Hyperlipidemia   . Hypertension   . Peripheral neuropathy    Past Surgical History:  Procedure Laterality Date  . ABDOMINAL HYSTERECTOMY    . CESAREAN SECTION     X 2  . COLONOSCOPY N/A 12/08/2013   Procedure: COLONOSCOPY;  Surgeon: Rogene Houston, MD;  Location: [facility];  Service: Endoscopy;  Laterality: N/A;  930  . KNEE ARTHROSCOPY  1998   left   Current Outpatient Medications on File Prior to Visit  Medication Sig Dispense Refill  . Alcohol Swabs 70 % PADS Use 1-4 times daily prn 200 each 3  . alendronate (FOSAMAX) 70 MG tablet Take 1 tablet (70 mg total) by mouth every 7 (seven) days. Take with a full glass of water on an empty stomach. 4 tablet 6  . amitriptyline (ELAVIL) 50 MG tablet Take 1 tablet (50 mg total) by mouth at bedtime. 90 tablet 1  . amLODipine (NORVASC) 10 MG tablet Take 1 tablet (10 mg total) by mouth daily. 90 tablet 1  . aspirin 81 MG tablet Take 1 tablet (81 mg total) by mouth at bedtime. 90 tablet 1  . Blood Glucose Monitoring Suppl (BLOOD GLUCOSE SYSTEM PAK) KIT Please dispense based on patient and insurance preference. Use as directed to monitor FSBS 3x daily. Dx: E11.9. 1 each 1  . Diclofenac  Sodium 3 % GEL Place 1 application onto the skin 3 (three) times daily as needed. 1 Tube 0  . empagliflozin (JARDIANCE) 10 MG TABS tablet Take 10 mg by mouth daily. 90 tablet 2  . Glucose Blood (BLOOD GLUCOSE TEST STRIPS) STRP Please dispense based on patient and insurance preference. Use as directed to monitor FSBS 3x daily. Dx: E11.9. 100 each 11  . Insulin Detemir (LEVEMIR FLEXTOUCH) 100 UNIT/ML Pen Inject 35 Units into the skin 2 (two) times daily. 60 mL 6  . insulin lispro (HUMALOG KWIKPEN) 100 UNIT/ML KiwkPen Inject 10-20units as directed with each meal three times a day 45 pen 3  . Lancets MISC Please dispense based on patient and insurance preference. Use as directed to monitor FSBS 3x daily. Dx: E11.9. 100 each 11  . metFORMIN (GLUCOPHAGE) 500 MG tablet Take 2 tablets (1,000 mg total) by mouth 2 (two) times daily with a meal. 360 tablet 1  . rosuvastatin (CRESTOR) 20 MG tablet Take 1 tablet (20 mg total) by mouth daily. 90 tablet 3   No current facility-administered medications on file prior to visit.    Allergies  Allergen Reactions  . Lisinopril Swelling    Lip swelling    Social History  Socioeconomic History  . Marital status: Single    Spouse name: Not on file  . Number of children: Not on file  . Years of education: Not on file  . Highest education level: Not on file  Occupational History  . Not on file  Social Needs  . Financial resource strain: Not on file  . Food insecurity:    Worry: Not on file    Inability: Not on file  . Transportation needs:    Medical: Not on file    Non-medical: Not on file  Tobacco Use  . Smoking status: Former Smoker    Packs/day: 0.25    Years: 18.00    Pack years: 4.50    Types: Cigarettes  . Smokeless tobacco: Never Used  . Tobacco comment: quit 1980's  Substance and Sexual Activity  . Alcohol use: No  . Drug use: No  . Sexual activity: Not Currently  Lifestyle  . Physical activity:    Days per week: Not on file     Minutes per session: Not on file  . Stress: Not on file  Relationships  . Social connections:    Talks on phone: Not on file    Gets together: Not on file    Attends religious service: Not on file    Active member of club or organization: Not on file    Attends meetings of clubs or organizations: Not on file    Relationship status: Not on file  . Intimate partner violence:    Fear of current or ex partner: Not on file    Emotionally abused: Not on file    Physically abused: Not on file    Forced sexual activity: Not on file  Other Topics Concern  . Not on file  Social History Narrative  . Not on file      Review of Systems  All other systems reviewed and are negative.      Objective:   Physical Exam  Constitutional: She appears well-developed and well-nourished. No distress.  Cardiovascular: Normal rate, regular rhythm and normal heart sounds.  Pulmonary/Chest: Effort normal and breath sounds normal. No stridor. No respiratory distress. She has no wheezes.  Skin: She is not diaphoretic.  Vitals reviewed.         Assessment & Plan:  Type 2 diabetes mellitus with diabetic autonomic neuropathy, with long-term current use of insulin ([place])  I have recommended increasing her Humalog to 10 units with first, 20 units with lunch, 20 units with supper and continue Levemir at his current dose.  Recheck sugars in 1 week.  She is to check her sugar before breakfast lunch and dinner and record those values so I can continue to make titrations in her Humalog.  Patient received her flu shot today

## 2018-05-11 NOTE — Addendum Note (Signed)
Addended by: Shary Decamp B on: 05/11/2018 10:36 AM   Modules accepted: Orders

## 2018-06-24 DIAGNOSIS — Z0100 Encounter for examination of eyes and vision without abnormal findings: Secondary | ICD-10-CM | POA: Diagnosis not present

## 2018-06-24 DIAGNOSIS — E119 Type 2 diabetes mellitus without complications: Secondary | ICD-10-CM | POA: Diagnosis not present

## 2018-06-24 LAB — HM DIABETES EYE EXAM

## 2018-10-28 ENCOUNTER — Encounter: Payer: Self-pay | Admitting: *Deleted

## 2018-12-10 ENCOUNTER — Ambulatory Visit (INDEPENDENT_AMBULATORY_CARE_PROVIDER_SITE_OTHER): Payer: Medicare Other | Admitting: Family Medicine

## 2018-12-10 ENCOUNTER — Other Ambulatory Visit: Payer: Self-pay

## 2018-12-10 VITALS — BP 142/78 | HR 95 | Temp 98.5°F | Resp 18 | Ht 67.0 in | Wt 218.0 lb

## 2018-12-10 DIAGNOSIS — E1143 Type 2 diabetes mellitus with diabetic autonomic (poly)neuropathy: Secondary | ICD-10-CM

## 2018-12-10 DIAGNOSIS — B029 Zoster without complications: Secondary | ICD-10-CM

## 2018-12-10 DIAGNOSIS — E1141 Type 2 diabetes mellitus with diabetic mononeuropathy: Secondary | ICD-10-CM

## 2018-12-10 DIAGNOSIS — I10 Essential (primary) hypertension: Secondary | ICD-10-CM

## 2018-12-10 DIAGNOSIS — Z794 Long term (current) use of insulin: Secondary | ICD-10-CM

## 2018-12-10 DIAGNOSIS — E782 Mixed hyperlipidemia: Secondary | ICD-10-CM

## 2018-12-10 MED ORDER — TRIAMCINOLONE ACETONIDE 0.1 % EX CREA: 1.0000 "application " | TOPICAL_CREAM | Freq: Two times a day (BID) | CUTANEOUS | 0 refills | Status: AC

## 2018-12-10 MED ORDER — VALACYCLOVIR HCL 1 G PO TABS: 1000.0000 mg | ORAL_TABLET | Freq: Two times a day (BID) | ORAL | 0 refills | Status: DC

## 2018-12-10 NOTE — Patient Instructions (Addendum)
New meter sent to [facility]  F/U 4 months for physical

## 2018-12-10 NOTE — Progress Notes (Signed)
   Subjective:    Patient ID: Sue Wood, female    DOB: 10-06-1945, 73 y.o.   MRN: [id number]  Patient presents for Diabetes (f/u) Pt here to f/u diabetes mellitus,last visit in August.   DM-last A1C 9.1%, taking Levemir 35units BID  , Novolog 10 units three times a day , metformin, Jardiance ( has not had the past few months )    - she has not been taking her sugar, states she cant find the test strips was buying relion, but now has mail order- [facility]     Diabetic neuropathy- taking elavil   Hyperlipidemia- taking crestor   HTN- taking BP meds, no SE with medication  Weight stable   Osteporosis- taking fosamax weekly  And vitamin D and calcium    Rash seen on neck during exam, she states it has been itching she though something bit her, no pain, no drainage   Review Of Systems:  GEN- denies fatigue, fever, weight loss,weakness, recent illness HEENT- denies eye drainage, change in vision, nasal discharge, CVS- denies chest pain, palpitations RESP- denies SOB, cough, wheeze ABD- denies N/V, change in stools, abd pain GU- denies dysuria, hematuria, dribbling, incontinence MSK- denies joint pain, muscle aches, injury Neuro- denies headache, dizziness, syncope, seizure activity       Objective:    BP (!) 142/78 (BP Location: Right Arm, Patient Position: Sitting, Cuff Size: Large)   Pulse 95   Temp 98.5 F (36.9 C)   Resp 18   Ht 5\' 7"  (1.702 m)   Wt 218 lb (98.9 kg)   SpO2 97%   BMI 34.14 kg/m  GEN- NAD, alert and oriented x3 HEENT- PERRL, EOMI, non injected sclera, pink conjunctiva, MMM, oropharynx clear Neck- Supple, no thyromegaly CVS- RRR, no murmur RESP-CTAB ABD-NABS,soft,NT,ND Skin- group of small blisters center of neck , few scattered toward right ear EXT- No edema Pulses- Radial, DP- 2+        Assessment & Plan:      Problem List Items Addressed This Visit      Unprioritized   Diabetes mellitus with neurological manifestation ([place])   Recheck A1C goal A1C <8%, no changes to meds Levemir 35 units BID, novolog 10 units with each meal New meter sent to mail order Check CBG three times a day       Relevant Orders   Microalbumin / creatinine urine ratio (Completed)   CBC with Differential/Platelet (Completed)   Comprehensive metabolic panel (Completed)   Hemoglobin A1c (Completed)   Diabetic neuropathy (HCC)    Continue elavil helps with symptoms      Essential hypertension, benign - Primary    Initial BP elevated, repeat more her baseline, no changes to meds      Hyperlipidemia    Check lipids and LFT continue statin drug        Relevant Orders   Lipid panel (Completed)    Other Visit Diagnoses    Herpes zoster without complication       treat for shingles given valtrex and Triamcinoloe for itching   Relevant Medications   valACYclovir (VALTREX) 1000 MG tablet      Note: This dictation was prepared with Dragon dictation along with smaller phrase technology. Any transcriptional errors that result from this process are unintentional.

## 2018-12-11 LAB — COMPREHENSIVE METABOLIC PANEL
AG Ratio: 1.3 (calc) (ref 1.0–2.5)
ALT: 18 U/L (ref 6–29)
AST: 21 U/L (ref 10–35)
Albumin: 4.4 g/dL (ref 3.6–5.1)
Alkaline phosphatase (APISO): 81 U/L (ref 37–153)
BUN: 8 mg/dL (ref 7–25)
CO2: 25 mmol/L (ref 20–32)
Calcium: 9.9 mg/dL (ref 8.6–10.4)
Chloride: 102 mmol/L (ref 98–110)
Creat: 0.93 mg/dL (ref 0.60–0.93)
Globulin: 3.3 g/dL (calc) (ref 1.9–3.7)
Glucose, Bld: 194 mg/dL — ABNORMAL HIGH (ref 65–99)
Potassium: 4.7 mmol/L (ref 3.5–5.3)
Sodium: 140 mmol/L (ref 135–146)
Total Bilirubin: 0.3 mg/dL (ref 0.2–1.2)
Total Protein: 7.7 g/dL (ref 6.1–8.1)

## 2018-12-11 LAB — HEMOGLOBIN A1C
Hgb A1c MFr Bld: 14 % of total Hgb — ABNORMAL HIGH (ref <5.7)
Mean Plasma Glucose: 355 (calc)
eAG (mmol/L): 19.7 (calc)

## 2018-12-11 LAB — LIPID PANEL
Cholesterol: 185 mg/dL (ref <200)
HDL: 55 mg/dL (ref >=50)
LDL Cholesterol (Calc): 111 mg/dL (calc) — ABNORMAL HIGH
Non-HDL Cholesterol (Calc): 130 mg/dL (calc) — ABNORMAL HIGH (ref <130)
Total CHOL/HDL Ratio: 3.4 (calc) (ref <5.0)
Triglycerides: 91 mg/dL (ref <150)

## 2018-12-11 LAB — CBC WITH DIFFERENTIAL/PLATELET
Absolute Monocytes: 251 cells/uL (ref 200–950)
Basophils Absolute: 31 cells/uL (ref 0–200)
Basophils Relative: 0.7 %
Eosinophils Absolute: 62 cells/uL (ref 15–500)
Eosinophils Relative: 1.4 %
HCT: 38.3 % (ref 35.0–45.0)
Hemoglobin: 13 g/dL (ref 11.7–15.5)
Lymphs Abs: 1426 cells/uL (ref 850–3900)
MCH: 29.1 pg (ref 27.0–33.0)
MCHC: 33.9 g/dL (ref 32.0–36.0)
MCV: 85.9 fL (ref 80.0–100.0)
MPV: 11.7 fL (ref 7.5–12.5)
Monocytes Relative: 5.7 %
Neutro Abs: 2631 cells/uL (ref 1500–7800)
Neutrophils Relative %: 59.8 %
Platelets: 274 10*3/uL (ref 140–400)
RBC: 4.46 10*6/uL (ref 3.80–5.10)
RDW: 13.6 % (ref 11.0–15.0)
Total Lymphocyte: 32.4 %
WBC: 4.4 10*3/uL (ref 3.8–10.8)

## 2018-12-11 LAB — MICROALBUMIN / CREATININE URINE RATIO
Creatinine, Urine: 60 mg/dL (ref 20–275)
Microalb Creat Ratio: 132 mcg/mg creat — ABNORMAL HIGH (ref <30)
Microalb, Ur: 7.9 mg/dL

## 2018-12-12 ENCOUNTER — Encounter: Payer: Self-pay | Admitting: Family Medicine

## 2018-12-12 NOTE — Assessment & Plan Note (Signed)
Initial BP elevated, repeat more her baseline, no changes to meds

## 2018-12-12 NOTE — Assessment & Plan Note (Signed)
Check lipids and LFT continue statin drug

## 2018-12-12 NOTE — Assessment & Plan Note (Signed)
Continue elavil helps with symptoms

## 2018-12-12 NOTE — Assessment & Plan Note (Addendum)
Recheck A1C goal A1C <8%, no changes to meds Levemir 35 units BID, novolog 10 units with each meal New meter sent to mail order Check CBG three times a day

## 2018-12-13 ENCOUNTER — Telehealth: Payer: Self-pay | Admitting: *Deleted

## 2018-12-13 ENCOUNTER — Other Ambulatory Visit: Payer: Self-pay | Admitting: *Deleted

## 2018-12-13 MED ORDER — BLOOD GLUCOSE TEST VI STRP: ORAL_STRIP | 11 refills | Status: DC

## 2018-12-13 MED ORDER — LANCETS MISC: 11 refills | Status: DC

## 2018-12-13 MED ORDER — INSULIN DETEMIR 100 UNIT/ML FLEXPEN: 37.0000 [IU] | PEN_INJECTOR | Freq: Two times a day (BID) | SUBCUTANEOUS | 6 refills | Status: DC

## 2018-12-13 MED ORDER — BLOOD GLUCOSE SYSTEM PAK KIT: PACK | 1 refills | Status: DC

## 2018-12-13 NOTE — Telephone Encounter (Signed)
Prescription sent to pharmacy.

## 2018-12-13 NOTE — Telephone Encounter (Signed)
-----   Message from Alycia Rossetti, MD sent at 12/10/2018  2:51 PM EDT ----- Regarding: Pt needs new meter, test strips TID testing, lancets sent to [facility]

## 2018-12-22 ENCOUNTER — Telehealth: Payer: Self-pay | Admitting: *Deleted

## 2018-12-22 NOTE — Telephone Encounter (Signed)
Received VM from patient. [phone]~ telephone.   Reports that she has been checking her FSBS and was advised to call in with readings.   Call placed to patient to obtain reading. No answer, no VM.

## 2018-12-23 NOTE — Telephone Encounter (Signed)
Call placed to patient. LMTRC.  

## 2018-12-24 NOTE — Telephone Encounter (Signed)
Multiple calls placed to patient with no answer and no return call.   Message to be closed.   Advised to return call and leave CBG Readings on VM.

## 2018-12-27 ENCOUNTER — Other Ambulatory Visit: Payer: Self-pay | Admitting: Family Medicine

## 2019-01-17 ENCOUNTER — Other Ambulatory Visit: Payer: Self-pay | Admitting: Family Medicine

## 2019-02-07 ENCOUNTER — Other Ambulatory Visit: Payer: Self-pay | Admitting: Family Medicine

## 2019-04-07 ENCOUNTER — Other Ambulatory Visit: Payer: Self-pay | Admitting: Family Medicine

## 2019-05-02 ENCOUNTER — Other Ambulatory Visit: Payer: Self-pay | Admitting: Family Medicine

## 2019-05-03 ENCOUNTER — Other Ambulatory Visit: Payer: Self-pay | Admitting: Family Medicine

## 2019-06-10 ENCOUNTER — Other Ambulatory Visit: Payer: Self-pay | Admitting: Family Medicine

## 2019-06-20 ENCOUNTER — Other Ambulatory Visit: Payer: Self-pay | Admitting: Family Medicine

## 2019-07-03 ENCOUNTER — Other Ambulatory Visit: Payer: Self-pay | Admitting: Family Medicine

## 2019-09-12 ENCOUNTER — Encounter: Payer: Self-pay | Admitting: Family Medicine

## 2019-09-12 ENCOUNTER — Other Ambulatory Visit: Payer: Self-pay

## 2019-09-12 ENCOUNTER — Ambulatory Visit (INDEPENDENT_AMBULATORY_CARE_PROVIDER_SITE_OTHER): Payer: Medicare Other | Admitting: Family Medicine

## 2019-09-12 VITALS — BP 138/72 | HR 74 | Temp 98.4°F | Resp 16 | Ht 67.0 in | Wt 221.0 lb

## 2019-09-12 DIAGNOSIS — I10 Essential (primary) hypertension: Secondary | ICD-10-CM | POA: Diagnosis not present

## 2019-09-12 DIAGNOSIS — E782 Mixed hyperlipidemia: Secondary | ICD-10-CM | POA: Diagnosis not present

## 2019-09-12 DIAGNOSIS — Z1231 Encounter for screening mammogram for malignant neoplasm of breast: Secondary | ICD-10-CM

## 2019-09-12 DIAGNOSIS — Z794 Long term (current) use of insulin: Secondary | ICD-10-CM | POA: Diagnosis not present

## 2019-09-12 DIAGNOSIS — Z23 Encounter for immunization: Secondary | ICD-10-CM | POA: Diagnosis not present

## 2019-09-12 DIAGNOSIS — E1143 Type 2 diabetes mellitus with diabetic autonomic (poly)neuropathy: Secondary | ICD-10-CM | POA: Diagnosis not present

## 2019-09-12 DIAGNOSIS — M810 Age-related osteoporosis without current pathological fracture: Secondary | ICD-10-CM | POA: Diagnosis not present

## 2019-09-12 DIAGNOSIS — E1141 Type 2 diabetes mellitus with diabetic mononeuropathy: Secondary | ICD-10-CM

## 2019-09-12 NOTE — Patient Instructions (Addendum)
F/u 4 MONTHS  Schedule mammogram and bone density

## 2019-09-12 NOTE — Assessment & Plan Note (Signed)
Well controlled no changes 

## 2019-09-12 NOTE — Progress Notes (Signed)
Subjective:    Patient ID: Sue Wood, female    DOB: June 28, 1946, 74 y.o.   MRN: [id number]  Patient presents for Follow-up (is not fasting)   Pt here to f/u chronic medical problems.   Medications reviewed     DM- She is currently on Levemir 35 units twice a day, novolog 10 units with each meal,  Metformin ( does not take regularly due to nausea), jardiance 25mg ,  last A1 14% in April 2020 in setting of COVID has not been checked since. She does not have her meter today . She tends to check three times a day, at supper, her Blood sugars are up into 200's, fasting states it is good around 180   No Hypoglycemia symptoms   Still working elavil for her neuropathy at bedtime   HTN- taking norvasc 10mg  once a day   Hyperlipidemia- taking crestor 20mg    Osteoporosis- on fosamax weekly calcium and D   Flu shot due today   Review Of Systems:  GEN- denies fatigue, fever, weight loss,weakness, recent illness HEENT- denies eye drainage, change in vision, nasal discharge, CVS- denies chest pain, palpitations RESP- denies SOB, cough, wheeze ABD- denies N/V, change in stools, abd pain GU- denies dysuria, hematuria, dribbling, incontinence MSK- denies joint pain, muscle aches, injury Neuro- denies headache, dizziness, syncope, seizure activity       Objective:    BP 138/72   Pulse 74   Temp 98.4 F (36.9 C) (Temporal)   Resp 16   Ht 5\' 7"  (1.702 m)   Wt 221 lb (100.2 kg)   SpO2 97%   BMI 34.61 kg/m  GEN- NAD, alert and oriented x3 HEENT- PERRL, EOMI, non injected sclera, pink conjunctiva, MMM, oropharynx clear Neck- Supple, no thyromegaly CVS- RRR, no murmur RESP-CTAB ABD-NABS,soft,NT,ND EXT- No edema Pulses- Radial, DP- 2+        Assessment & Plan:      Problem List Items Addressed This Visit      Unprioritized   Diabetes mellitus with neurological manifestation (HCC)    Type 2 diabetes which is uncontrolled.  Her A1c is still greater than 13%.  I'm going  to  have her increase her Levemir to 36 units twice a day.  Increase her mealtime coverage to 12 units with each meal      Relevant Orders   Microalbumin/Creatinine Ratio, Urine   Microalbumin/Creatinine Ratio, Urine (Completed)   CBC with Differential (Completed)   Comprehensive metabolic panel (Completed)   Hemoglobin A1c (Completed)   Lipid Panel (Completed)   MM SCREENING BREAST TOMO BILATERAL   Diabetic neuropathy ([place]) - Primary   Relevant Orders   Microalbumin/Creatinine Ratio, Urine   Essential hypertension, benign    Well controlled no changes       Relevant Orders   Comprehensive metabolic panel (Completed)   Hyperlipidemia   Relevant Orders   Lipid Panel (Completed)   Osteoporosis    She does schedule bone density.  She is still on Fosamax along with vitamin D and calcium.      Relevant Orders   DG Bone Density    Other Visit Diagnoses    Encounter for screening mammogram for malignant neoplasm of breast       Need for immunization against influenza       Relevant Orders   Flu Vaccine QUAD High Dose(Fluad) (Completed)      Note: This dictation was prepared with Dragon dictation along with smaller phrase technology. Any transcriptional errors that result  from this process are unintentional.

## 2019-09-13 ENCOUNTER — Encounter: Payer: Self-pay | Admitting: Family Medicine

## 2019-09-13 LAB — CBC WITH DIFFERENTIAL/PLATELET
Absolute Monocytes: 228 cells/uL (ref 200–950)
Basophils Absolute: 29 cells/uL (ref 0–200)
Basophils Relative: 0.5 %
Eosinophils Absolute: 40 cells/uL (ref 15–500)
Eosinophils Relative: 0.7 %
HCT: 39.8 % (ref 35.0–45.0)
Hemoglobin: 13.1 g/dL (ref 11.7–15.5)
Lymphs Abs: 1875 cells/uL (ref 850–3900)
MCH: 28.4 pg (ref 27.0–33.0)
MCHC: 32.9 g/dL (ref 32.0–36.0)
MCV: 86.1 fL (ref 80.0–100.0)
MPV: 12.3 fL (ref 7.5–12.5)
Monocytes Relative: 4 %
Neutro Abs: 3528 cells/uL (ref 1500–7800)
Neutrophils Relative %: 61.9 %
Platelets: 256 10*3/uL (ref 140–400)
RBC: 4.62 10*6/uL (ref 3.80–5.10)
RDW: 13.1 % (ref 11.0–15.0)
Total Lymphocyte: 32.9 %
WBC: 5.7 10*3/uL (ref 3.8–10.8)

## 2019-09-13 LAB — LIPID PANEL
Cholesterol: 181 mg/dL (ref <200)
HDL: 52 mg/dL (ref >=50)
LDL Cholesterol (Calc): 110 mg/dL (calc) — ABNORMAL HIGH
Non-HDL Cholesterol (Calc): 129 mg/dL (calc) (ref <130)
Total CHOL/HDL Ratio: 3.5 (calc) (ref <5.0)
Triglycerides: 94 mg/dL (ref <150)

## 2019-09-13 LAB — COMPREHENSIVE METABOLIC PANEL
AG Ratio: 1.2 (calc) (ref 1.0–2.5)
ALT: 18 U/L (ref 6–29)
AST: 18 U/L (ref 10–35)
Albumin: 4.5 g/dL (ref 3.6–5.1)
Alkaline phosphatase (APISO): 88 U/L (ref 37–153)
BUN/Creatinine Ratio: 11 (calc) (ref 6–22)
BUN: 11 mg/dL (ref 7–25)
CO2: 24 mmol/L (ref 20–32)
Calcium: 9.8 mg/dL (ref 8.6–10.4)
Chloride: 95 mmol/L — ABNORMAL LOW (ref 98–110)
Creat: 1.03 mg/dL — ABNORMAL HIGH (ref 0.60–0.93)
Globulin: 3.7 g/dL (calc) (ref 1.9–3.7)
Glucose, Bld: 359 mg/dL — ABNORMAL HIGH (ref 65–99)
Potassium: 4.2 mmol/L (ref 3.5–5.3)
Sodium: 132 mmol/L — ABNORMAL LOW (ref 135–146)
Total Bilirubin: 0.3 mg/dL (ref 0.2–1.2)
Total Protein: 8.2 g/dL — ABNORMAL HIGH (ref 6.1–8.1)

## 2019-09-13 LAB — HEMOGLOBIN A1C
Hgb A1c MFr Bld: 13.7 % of total Hgb — ABNORMAL HIGH (ref <5.7)
Mean Plasma Glucose: 346 (calc)
eAG (mmol/L): 19.2 (calc)

## 2019-09-13 LAB — MICROALBUMIN / CREATININE URINE RATIO
Creatinine, Urine: 49 mg/dL (ref 20–275)
Microalb Creat Ratio: 216 mcg/mg creat — ABNORMAL HIGH (ref <30)
Microalb, Ur: 10.6 mg/dL

## 2019-09-13 NOTE — Assessment & Plan Note (Signed)
She does schedule bone density.  She is still on Fosamax along with vitamin D and calcium.

## 2019-09-13 NOTE — Assessment & Plan Note (Signed)
Type 2 diabetes which is uncontrolled.  Her A1c is still greater than 13%.  I'm going to  have her increase her Levemir to 36 units twice a day.  Increase her mealtime coverage to 12 units with each meal

## 2019-09-15 ENCOUNTER — Other Ambulatory Visit: Payer: Self-pay | Admitting: *Deleted

## 2019-09-15 MED ORDER — LEVEMIR FLEXTOUCH 100 UNIT/ML ~~LOC~~ SOPN: PEN_INJECTOR | SUBCUTANEOUS | 0 refills | Status: DC

## 2019-09-15 MED ORDER — HUMALOG KWIKPEN 100 UNIT/ML ~~LOC~~ SOPN: PEN_INJECTOR | SUBCUTANEOUS | 0 refills | Status: DC

## 2019-10-05 ENCOUNTER — Other Ambulatory Visit (HOSPITAL_COMMUNITY): Payer: Self-pay | Admitting: Family Medicine

## 2019-10-05 DIAGNOSIS — N63 Unspecified lump in unspecified breast: Secondary | ICD-10-CM

## 2019-10-08 ENCOUNTER — Other Ambulatory Visit: Payer: Self-pay | Admitting: Family Medicine

## 2019-10-18 ENCOUNTER — Encounter: Payer: Self-pay | Admitting: Family Medicine

## 2019-10-18 ENCOUNTER — Ambulatory Visit (HOSPITAL_COMMUNITY): Payer: Medicare Other

## 2019-10-18 ENCOUNTER — Other Ambulatory Visit: Payer: Self-pay

## 2019-10-18 ENCOUNTER — Ambulatory Visit (INDEPENDENT_AMBULATORY_CARE_PROVIDER_SITE_OTHER): Payer: Medicare Other | Admitting: Family Medicine

## 2019-10-18 ENCOUNTER — Encounter (HOSPITAL_COMMUNITY): Payer: Medicare HMO

## 2019-10-18 ENCOUNTER — Other Ambulatory Visit (HOSPITAL_COMMUNITY): Payer: Medicare HMO

## 2019-10-18 ENCOUNTER — Ambulatory Visit (HOSPITAL_COMMUNITY): Admission: RE | Admit: 2019-10-18 | Payer: Medicare Other | Source: Ambulatory Visit

## 2019-10-18 VITALS — BP 128/72 | HR 98 | Temp 98.0°F | Resp 14 | Ht 67.0 in | Wt 222.0 lb

## 2019-10-18 DIAGNOSIS — E782 Mixed hyperlipidemia: Secondary | ICD-10-CM

## 2019-10-18 DIAGNOSIS — E1143 Type 2 diabetes mellitus with diabetic autonomic (poly)neuropathy: Secondary | ICD-10-CM | POA: Diagnosis not present

## 2019-10-18 DIAGNOSIS — Z794 Long term (current) use of insulin: Secondary | ICD-10-CM

## 2019-10-18 MED ORDER — JARDIANCE 10 MG PO TABS: 10.0000 mg | ORAL_TABLET | Freq: Every day | ORAL | 2 refills | Status: DC

## 2019-10-18 NOTE — Assessment & Plan Note (Signed)
We had a long discussion regarding nutrition for patient.  She is eating a significant amount of carbs and sugar throughout the day causing her to spike continuously.  Going to increase her Levemir to 38 units twice a day.  NovoLog will increase to 12 units with each meal.  Hopefully we can restart her on Jardiance 10 mg she is going to check with the pharmacy regarding the cost.  Continue Metformin.  Always get her A1c less than 8%.  She is unable to go to nutritionist at this time due to transportation and cost of specialist.  We went over some dietary changes that she can tweak at home.  She does prepare meals just for herself so often take some random things.  She also often eats a lot of sandwiches because of the ease and cost.  We discussed trying to increase vegetables with her lunch and dinner.  Also discussed other varieties of fruits with blood sugar.  Decrease her milk to 2% in the morning.  We also discussed all the sweetener she placed in her coffee.  We will recheck her labs in 2 months.

## 2019-10-18 NOTE — Patient Instructions (Addendum)
Try Stevia sweetner for your foods/beverages Reduce the bagels Okay to keep the whole wheat toast For lunch add vegetables with your sandwhich or the applesauce Snacks yogurt, deli slice meat, [id number], small apple, berries, peanut with the apples Dinner - meat/protein with veggies Increase levemir to  38 units twice a day  Increase  Novolog to 12 units with each meal  Call us if Vania Rea is not helping  F/U 2 months

## 2019-10-18 NOTE — Progress Notes (Signed)
Subjective:    Patient ID: Sue Wood, female    DOB: 08-16-1946, 74 y.o.   MRN: [id number]  Patient presents for Follow-up (is not fasting) Patient here for interim follow-up on her diabetes mellitus.  Her last visit was a month ago. Her A1c was uncontrolled at 13.7%.  He does not bring her meter with her to office visit so difficult to tell what her blood sugars have been running.  I increased her Levemir to 37 units twice a day and increase her NovoLog to 11 units with each meal, breakfast lunch dinner.  Metformin 1000 mg twice a day  - she is not on jardiance states that it was too expensive last year so she stopped taking it.  Now has a different plan and believes that she can afford the medication and would like to restart it.   Has mild renal insufficiency which is chronic as well as mild hyperlipidemia.  She is on Crestor 20 mg  Reviewed all of her labs at bedside.  Her glucometer today showed that her fasting sugars are greater than 200.  Her 30-day average is 330.  Her 90-day average 292.  She does typically check her sugar 3 times a day.  Her blood sugar right before she had her lunch today was 359.  A couple of days ago it was too high to be read and prior to that her blood sugars were trending in the 400s.  We discussed recall of her nutrition.  She typically has 8 to 10 ounces of milk whole milk with breakfast.  She also has coffee with sweeteners.  She then drinks milk the rest of the day.  For breakfast she typically has oatmeal which he uses cinnamon and sugar as well as [place] sausage toaster bagel.  For lunch she typically has a sandwich almost every day but she is using whole-grain.  She typically pairs that with either fruit or chips or cookies.  Between meal she snacks on apples and oranges.  Dinnertime varies but she is trying to get more creative.  She will typically have a meat or she will have beans and green beans at least 2-3 times a week.  Last night she had a  burger and fries.  She states that she always has a snack before bedtime after she gets her insulin.  She has not had any hypoglycemia but her brothers used to have hypoglycemia and she would always give him a snack at bedtime after his insulin.  She typically snacks on chocolate wafers  Review Of Systems:  GEN- denies fatigue, fever, weight loss,weakness, recent illness HEENT- denies eye drainage, change in vision, nasal discharge, CVS- denies chest pain, palpitations RESP- denies SOB, cough, wheeze Neuro- denies headache, dizziness, syncope, seizure activity       Objective:    BP 128/72   Pulse 98   Temp 98 F (36.7 C) (Temporal)   Resp 14   Ht 5\' 7"  (1.702 m)   Wt 222 lb (100.7 kg)   SpO2 98%   BMI 34.77 kg/m  GEN- NAD, alert and oriented x3         Assessment & Plan:    Approximately 20 minutes spent with patient.  Greater than 50% spent on medication management as well as nutrition. Problem List Items Addressed This Visit      Unprioritized   Diabetes mellitus with neurological manifestation ([place]) - Primary    We had a long discussion regarding nutrition for patient.  She is eating a significant amount of carbs and sugar throughout the day causing her to spike continuously.  Going to increase her Levemir to 38 units twice a day.  NovoLog will increase to 12 units with each meal.  Hopefully we can restart her on Jardiance 10 mg she is going to check with the pharmacy regarding the cost.  Continue Metformin.  Always get her A1c less than 8%.  She is unable to go to nutritionist at this time due to transportation and cost of specialist.  We went over some dietary changes that she can tweak at home.  She does prepare meals just for herself so often take some random things.  She also often eats a lot of sandwiches because of the ease and cost.  We discussed trying to increase vegetables with her lunch and dinner.  Also discussed other varieties of fruits with blood sugar.   Decrease her milk to 2% in the morning.  We also discussed all the sweetener she placed in her coffee.  We will recheck her labs in 2 months.      Relevant Medications   empagliflozin (JARDIANCE) 10 MG TABS tablet   Hyperlipidemia      Note: This dictation was prepared with Dragon dictation along with smaller phrase technology. Any transcriptional errors that result from this process are unintentional.

## 2019-11-03 ENCOUNTER — Other Ambulatory Visit: Payer: Self-pay | Admitting: Family Medicine

## 2019-11-19 ENCOUNTER — Ambulatory Visit: Payer: Medicare Other | Attending: Internal Medicine

## 2019-11-19 DIAGNOSIS — Z23 Encounter for immunization: Secondary | ICD-10-CM

## 2019-11-19 NOTE — Progress Notes (Signed)
   Covid-19 Vaccination Clinic  Name:  Sue Wood    MRN: [id number] DOB: 03-Jul-1946  11/19/2019  [name] was observed post Covid-19 immunization for 15 minutes without incident. She was provided with Vaccine Information Sheet and instruction to access the V-Safe system.   [name] was instructed to call 911 with any severe reactions post vaccine: Marland Kitchen Difficulty breathing  . Swelling of face and throat  . A fast heartbeat  . A bad rash all over body  . Dizziness and weakness   Immunizations Administered    Name Date Dose VIS Date Route   Moderna COVID-19 Vaccine 11/19/2019 12:38 PM 0.5 mL 08/09/2019 Intramuscular   Manufacturer: Moderna   Lot: [id number]   [id number]

## 2019-11-22 ENCOUNTER — Ambulatory Visit (HOSPITAL_COMMUNITY)
Admission: RE | Admit: 2019-11-22 | Discharge: 2019-11-22 | Disposition: A | Discharge status: Home / Self Care | Payer: Medicare Other | Source: Ambulatory Visit | Attending: Family Medicine | Admitting: Family Medicine

## 2019-11-22 ENCOUNTER — Other Ambulatory Visit: Payer: Self-pay

## 2019-11-22 ENCOUNTER — Encounter: Payer: Self-pay | Admitting: *Deleted

## 2019-11-22 DIAGNOSIS — N63 Unspecified lump in unspecified breast: Secondary | ICD-10-CM

## 2019-11-22 DIAGNOSIS — N6489 Other specified disorders of breast: Secondary | ICD-10-CM | POA: Diagnosis not present

## 2019-11-22 DIAGNOSIS — M810 Age-related osteoporosis without current pathological fracture: Secondary | ICD-10-CM | POA: Insufficient documentation

## 2019-11-22 DIAGNOSIS — M8580 Other specified disorders of bone density and structure, unspecified site: Secondary | ICD-10-CM | POA: Diagnosis not present

## 2019-11-22 DIAGNOSIS — Z780 Asymptomatic menopausal state: Secondary | ICD-10-CM | POA: Diagnosis not present

## 2019-11-22 DIAGNOSIS — N6320 Unspecified lump in the left breast, unspecified quadrant: Secondary | ICD-10-CM | POA: Diagnosis present

## 2019-11-22 DIAGNOSIS — R928 Other abnormal and inconclusive findings on diagnostic imaging of breast: Secondary | ICD-10-CM | POA: Diagnosis not present

## 2019-11-25 ENCOUNTER — Other Ambulatory Visit: Payer: Self-pay | Admitting: Family Medicine

## 2019-12-15 ENCOUNTER — Other Ambulatory Visit: Payer: Self-pay | Admitting: Family Medicine

## 2019-12-21 ENCOUNTER — Ambulatory Visit: Payer: Medicare Other | Attending: Internal Medicine

## 2019-12-21 DIAGNOSIS — Z23 Encounter for immunization: Secondary | ICD-10-CM

## 2019-12-21 NOTE — Progress Notes (Signed)
   Covid-19 Vaccination Clinic  Name:  ISAIAH LAPORTA    MRN: [id number] DOB: 1946-08-10  12/21/2019  Ms. Breaker was observed post Covid-19 immunization for 15 minutes without incident. She was provided with Vaccine Information Sheet and instruction to access the V-Safe system.   Ms. Avila was instructed to call 911 with any severe reactions post vaccine: Marland Kitchen Difficulty breathing  . Swelling of face and throat  . A fast heartbeat  . A bad rash all over body  . Dizziness and weakness   Immunizations Administered    Name Date Dose VIS Date Route   Moderna COVID-19 Vaccine 12/21/2019 11:36 AM 0.5 mL 08/09/2019 Intramuscular   Manufacturer: Moderna   Lot: [id number]   [id number]

## 2020-01-02 ENCOUNTER — Other Ambulatory Visit: Payer: Self-pay | Admitting: Family Medicine

## 2020-01-06 ENCOUNTER — Other Ambulatory Visit: Payer: Self-pay | Admitting: Family Medicine

## 2020-01-15 ENCOUNTER — Other Ambulatory Visit: Payer: Self-pay | Admitting: Family Medicine

## 2020-01-28 ENCOUNTER — Other Ambulatory Visit: Payer: Self-pay | Admitting: Family Medicine

## 2020-02-18 ENCOUNTER — Other Ambulatory Visit: Payer: Self-pay | Admitting: Family Medicine

## 2020-03-09 ENCOUNTER — Other Ambulatory Visit: Payer: Self-pay | Admitting: Family Medicine

## 2020-03-20 ENCOUNTER — Ambulatory Visit (INDEPENDENT_AMBULATORY_CARE_PROVIDER_SITE_OTHER): Payer: Medicare Other | Admitting: Family Medicine

## 2020-03-20 ENCOUNTER — Encounter: Payer: Self-pay | Admitting: Family Medicine

## 2020-03-20 ENCOUNTER — Other Ambulatory Visit: Payer: Self-pay

## 2020-03-20 VITALS — BP 132/66 | HR 96 | Temp 98.2°F | Resp 14 | Ht 67.0 in | Wt 226.0 lb

## 2020-03-20 DIAGNOSIS — I10 Essential (primary) hypertension: Secondary | ICD-10-CM | POA: Diagnosis not present

## 2020-03-20 DIAGNOSIS — Z794 Long term (current) use of insulin: Secondary | ICD-10-CM | POA: Diagnosis not present

## 2020-03-20 DIAGNOSIS — E1141 Type 2 diabetes mellitus with diabetic mononeuropathy: Secondary | ICD-10-CM | POA: Diagnosis not present

## 2020-03-20 DIAGNOSIS — E782 Mixed hyperlipidemia: Secondary | ICD-10-CM

## 2020-03-20 DIAGNOSIS — M810 Age-related osteoporosis without current pathological fracture: Secondary | ICD-10-CM

## 2020-03-20 DIAGNOSIS — Z6835 Body mass index (BMI) 35.0-35.9, adult: Secondary | ICD-10-CM

## 2020-03-20 DIAGNOSIS — E1143 Type 2 diabetes mellitus with diabetic autonomic (poly)neuropathy: Secondary | ICD-10-CM

## 2020-03-20 NOTE — Assessment & Plan Note (Signed)
Continue elavil  °

## 2020-03-20 NOTE — Assessment & Plan Note (Signed)
Continue crestor 

## 2020-03-20 NOTE — Assessment & Plan Note (Signed)
Unable to afford fosamax

## 2020-03-20 NOTE — Assessment & Plan Note (Signed)
Controlled no changes 

## 2020-03-20 NOTE — Progress Notes (Signed)
Subjective:    Patient ID: Sue Wood, female    DOB: February 07, 1946, 75 y.o.   MRN: [id number]  Patient presents for Follow-up (is fasting- states that CBG is fluctuating) Patient here to follow-up diabetes mellitus.  Last visit in Jan her A1c was found to be significantly elevated at 13.7% has had uncontrolled diabetes for quite some time but typically A1c around 9 Levemir was increased to 36 units twice a day NovoLog was increased to 12 units with each meal. At her follow-up visit in February fasting blood sugars were still quite elevated so her Levemir was increased to 38 units.  She was not taking the higher dose of NovoLog so this was also changed to 12 units.  She was also started on Jardiance 10 mg once a day, goal to get A1c less than 8%.  We discussed some dietary changes to reduce sugars in the diet.   Today she states that her blood sugars range are still up and down She has been giving 12 units of Novolog three times a day ,  She has only been taking Levemir 36 units BID  She is not taking Jardiancemetformin  she could not afford  She states [facility] her get her medicine She has had to skip insulin due to cost     Average 7 day 249  , 14 day 228 , 30 day 212   tyically hcecks CBG 2-3 times a day  This AM CBG  204     She has not been on fosamax weekly due to cost She was also off elavil due to cost- she now back on the this , her pharmacy has been working with her     Hyperlipidemia she is currently on Crestor 20 mg once a day LDL was 110 in January total cholesterol 181 triglycerides 94 Normal LFTs  Due for foot exam /Eye exam   Review Of Systems:  GEN- denies fatigue, fever, weight loss,weakness, recent illness HEENT- denies eye drainage, change in vision, nasal discharge, CVS- denies chest pain, palpitations RESP- denies SOB, cough, wheeze ABD- denies N/V, change in stools, abd pain GU- denies dysuria, hematuria, dribbling,  incontinence MSK- denies joint pain, muscle aches, injury Neuro- denies headache, dizziness, syncope, seizure activity       Objective:    BP 132/66   Pulse 96   Temp 98.2 F (36.8 C) (Temporal)   Resp 14   Ht 5\' 7"  (1.702 m)   Wt 226 lb (102.5 kg)   SpO2 96%   BMI 35.40 kg/m  GEN- NAD, alert and oriented x3 HEENT- PERRL, EOMI, non injected sclera, pink conjunctiva, MMM, oropharynx clear Neck- Supple, no thyromegaly CVS- RRR, no murmur RESP-CTAB ABD-NABS,soft,NT,ND EXT- trace ankle  edema Pulses- Radial, DP- 2+        Assessment & Plan:      Problem List Items Addressed This Visit      Unprioritized   Diabetes mellitus with neurological manifestation (HCC)    Pt unable to afford all her meds She will be referred to care management She does have insulin at home Increase levemir to  37units BID, advised importance of this since she is not taking metformin or jardiance Continue novolog 12 untis with each meal CBG have improved       Relevant Orders   HM DIABETES FOOT EXAM (Completed)   Comprehensive metabolic panel   Hemoglobin A1c   Lipid panel   Ambulatory referral to Chronic Care Management Services  Diabetic neuropathy ([place])    Continue elavil      Essential hypertension, benign - Primary    Controlled no changes       Relevant Orders   Ambulatory referral to Chronic Care Management Services   Hyperlipidemia    Continue crestor      Relevant Orders   Lipid panel   Ambulatory referral to Chronic Care Management Services   Obesity   Osteoporosis    Unable to afford fosamax          Note: This dictation was prepared with Dragon dictation along with smaller phrase technology. Any transcriptional errors that result from this process are unintentional.

## 2020-03-20 NOTE — Patient Instructions (Addendum)
Referral to Care management for your medications Increase Levemir to to 37 units twice a day   Keep the novolog the same  F/U 3 months for Physical

## 2020-03-20 NOTE — Assessment & Plan Note (Signed)
Pt unable to afford all her meds She will be referred to care management She does have insulin at home Increase levemir to  37units BID, advised importance of this since she is not taking metformin or jardiance Continue novolog 12 untis with each meal CBG have improved

## 2020-03-21 ENCOUNTER — Telehealth: Payer: Self-pay | Admitting: Family Medicine

## 2020-03-21 LAB — COMPREHENSIVE METABOLIC PANEL
AG Ratio: 1.2 (calc) (ref 1.0–2.5)
ALT: 13 U/L (ref 6–29)
AST: 15 U/L (ref 10–35)
Albumin: 4.1 g/dL (ref 3.6–5.1)
Alkaline phosphatase (APISO): 75 U/L (ref 37–153)
BUN: 13 mg/dL (ref 7–25)
CO2: 25 mmol/L (ref 20–32)
Calcium: 9.3 mg/dL (ref 8.6–10.4)
Chloride: 103 mmol/L (ref 98–110)
Creat: 0.86 mg/dL (ref 0.60–0.93)
Globulin: 3.3 g/dL (calc) (ref 1.9–3.7)
Glucose, Bld: 170 mg/dL — ABNORMAL HIGH (ref 65–99)
Potassium: 4 mmol/L (ref 3.5–5.3)
Sodium: 138 mmol/L (ref 135–146)
Total Bilirubin: 0.4 mg/dL (ref 0.2–1.2)
Total Protein: 7.4 g/dL (ref 6.1–8.1)

## 2020-03-21 LAB — HEMOGLOBIN A1C
Hgb A1c MFr Bld: 9.4 % of total Hgb — ABNORMAL HIGH (ref <5.7)
Mean Plasma Glucose: 223 (calc)
eAG (mmol/L): 12.4 (calc)

## 2020-03-21 LAB — LIPID PANEL
Cholesterol: 174 mg/dL (ref <200)
HDL: 54 mg/dL (ref >=50)
LDL Cholesterol (Calc): 101 mg/dL (calc) — ABNORMAL HIGH
Non-HDL Cholesterol (Calc): 120 mg/dL (calc) (ref <130)
Total CHOL/HDL Ratio: 3.2 (calc) (ref <5.0)
Triglycerides: 95 mg/dL (ref <150)

## 2020-03-21 NOTE — Chronic Care Management (AMB) (Signed)
  Chronic Care Management   Note  03/21/2020 Name: Sue Wood MRN: [id number] DOB: 02-Jan-1946  Sue Wood is a 74 y.o. year old female who is a primary care patient of [place], Modena Nunnery, MD. I reached out to Sue Wood by phone today in response to a referral sent by Ms. Cherryl M Berland's PCP, Buelah Manis, Modena Nunnery, MD.   Sue Wood was given information about Chronic Care Management services today including:  1. CCM service includes personalized support from designated clinical staff supervised by her physician, including individualized plan of care and coordination with other care providers 2. 24/7 contact phone numbers for assistance for urgent and routine care needs. 3. Service will only be billed when office clinical staff spend 20 minutes or more in a month to coordinate care. 4. Only one practitioner may furnish and bill the service in a calendar month. 5. The patient may stop CCM services at any time (effective at the end of the month) by phone call to the office staff.   Patient agreed to services and verbal consent obtained.   Follow up plan:  [place]

## 2020-04-23 ENCOUNTER — Other Ambulatory Visit: Payer: Self-pay | Admitting: Family Medicine

## 2020-04-23 DIAGNOSIS — E1143 Type 2 diabetes mellitus with diabetic autonomic (poly)neuropathy: Secondary | ICD-10-CM

## 2020-04-23 DIAGNOSIS — Z794 Long term (current) use of insulin: Secondary | ICD-10-CM

## 2020-05-15 ENCOUNTER — Other Ambulatory Visit: Payer: Self-pay | Admitting: *Deleted

## 2020-05-15 ENCOUNTER — Telehealth: Payer: Self-pay | Admitting: Pharmacist

## 2020-05-15 DIAGNOSIS — E66812 Obesity, class 2: Secondary | ICD-10-CM

## 2020-05-15 DIAGNOSIS — M810 Age-related osteoporosis without current pathological fracture: Secondary | ICD-10-CM

## 2020-05-15 DIAGNOSIS — E1143 Type 2 diabetes mellitus with diabetic autonomic (poly)neuropathy: Secondary | ICD-10-CM

## 2020-05-15 DIAGNOSIS — I10 Essential (primary) hypertension: Secondary | ICD-10-CM

## 2020-05-15 DIAGNOSIS — E1141 Type 2 diabetes mellitus with diabetic mononeuropathy: Secondary | ICD-10-CM

## 2020-05-15 DIAGNOSIS — E782 Mixed hyperlipidemia: Secondary | ICD-10-CM

## 2020-05-15 NOTE — Progress Notes (Signed)
    Chronic Care Management Pharmacy Assistant   Name: Sue Wood  MRN: [id number] DOB: 05/08/1946  Reason for Encounter: Medication Review  Patient Questions:  1.  Have you seen any other providers since your last visit? No  2.  Any changes in your medicines or health? No   Sue Wood,  74 y.o. , female presents for their Initial CCM visit with the clinical pharmacist In office.  PCP : Alycia Rossetti, MD  Allergies:   Allergies  Allergen Reactions  . Lisinopril Swelling    Lip swelling     Medications: Outpatient Encounter Medications as of 05/15/2020  Medication Sig  . HUMALOG KWIKPEN 100 UNIT/ML KwikPen INJECT 12 UNITS SUBCUTANEOUSLY WITH EACH MEAL. E11.43  . insulin detemir (LEVEMIR FLEXTOUCH) 100 UNIT/ML FlexPen INJECT 36 UNITS SUBCUTANEOUSLY TWICE DAILY. E11.43  . Alcohol Swabs 70 % PADS Use 1-4 times daily prn  . amitriptyline (ELAVIL) 50 MG tablet TAKE 1 TABLET BY MOUTH AT  BEDTIME  . amLODipine (NORVASC) 10 MG tablet TAKE 1 TABLET BY MOUTH  DAILY  . aspirin 81 MG tablet Take 1 tablet (81 mg total) by mouth at bedtime.  . Blood Glucose Monitoring Suppl (BLOOD GLUCOSE SYSTEM PAK) KIT Please dispense based on patient and insurance preference. Use as directed to monitor FSBS 3x daily. Dx: E11.9.  . Lancets MISC Please dispense based on patient and insurance preference. Use as directed to monitor FSBS 3x daily. Dx: E11.9.  . ONETOUCH ULTRA test strip USE TO MONITOR FASTING  BLOOD SUGAR 3 TIMES DAILY  AS DIRECTED  . rosuvastatin (CRESTOR) 20 MG tablet TAKE 1 TABLET BY MOUTH  DAILY  . triamcinolone cream (KENALOG) 0.1 % Apply 1 application topically 2 (two) times daily.   No facility-administered encounter medications on file as of 05/15/2020.    Current Diagnosis: Patient Active Problem List   Diagnosis Date Noted  . Osteoporosis 06/30/2017  . Osteoarthritis, knee 12/16/2012  . Diabetes mellitus with neurological manifestation ([place]) 12/31/2011  .  Essential hypertension, benign 12/31/2011  . Hyperlipidemia 12/31/2011  . Bilateral bunions 12/31/2011  . Diabetic neuropathy ([place]) 12/31/2011  . Obesity 12/31/2011    Goals Addressed   None     Follow-Up:  Care Coordination with Outside Provider and Coordination of Enhanced Pharmacy Services   Have you seen any other providers since your last visit? **no Any changes in your medications or health? no Any side effects from any medications? no Do you have an symptoms or problems not managed by your medications? no Any concerns about your health right now? no Has your provider asked that you check blood pressure, blood sugar, or follow special diet at home? Yes, check blood sugars and watch food portions. Do you get any type of exercise on a regular basis? Yes, patient walks in the mornings. Can you think of a goal you would like to reach for your health? Wants to get down to 180 lbs. Patient states she is in the 200 lbs range and would like to get below 200 lbs specifically to about 180 lbs. Do you have any problems getting your medications? No, patient states not anymore, she is now getting some type of assistance through [facility]. Is there anything that you would like to discuss during the appointment? No current concerns.  Please bring medications and supplements to appointment

## 2020-05-15 NOTE — Chronic Care Management (AMB) (Signed)
Chronic Care Management Pharmacy  Name: Sue Wood  MRN: [id number] DOB: May 08, 1946  Chief Complaint/ HPI  Sue Wood,  74 y.o. , female presents for their Initial CCM visit with the clinical pharmacist In office.  PCP : Alycia Rossetti, MD  Their chronic conditions include: HTN, Diabetes, HLD, osteoporosis, osteoarthritis.  Office Visits: 03/20/2020 [facility]) -   History of uncontrolled DM  Goal is to get A1c <8%  She has to skip insulin due to cost  Not taking a lot of medications due to cost  Referral to CCM for cost  Consult Visit: none recent  Medications: Outpatient Encounter Medications as of 05/17/2020  Medication Sig  . Alcohol Swabs 70 % PADS Use 1-4 times daily prn  . amitriptyline (ELAVIL) 50 MG tablet TAKE 1 TABLET BY MOUTH AT  BEDTIME  . amLODipine (NORVASC) 10 MG tablet TAKE 1 TABLET BY MOUTH  DAILY  . aspirin 81 MG tablet Take 1 tablet (81 mg total) by mouth at bedtime.  . Blood Glucose Monitoring Suppl (BLOOD GLUCOSE SYSTEM PAK) KIT Please dispense based on patient and insurance preference. Use as directed to monitor FSBS 3x daily. Dx: E11.9.  . HUMALOG KWIKPEN 100 UNIT/ML KwikPen INJECT 12 UNITS SUBCUTANEOUSLY WITH EACH MEAL. E11.43  . insulin detemir (LEVEMIR FLEXTOUCH) 100 UNIT/ML FlexPen INJECT 36 UNITS SUBCUTANEOUSLY TWICE DAILY. E11.43  . Lancets MISC Please dispense based on patient and insurance preference. Use as directed to monitor FSBS 3x daily. Dx: E11.9.  . ONETOUCH ULTRA test strip USE TO MONITOR FASTING  BLOOD SUGAR 3 TIMES DAILY  AS DIRECTED  . rosuvastatin (CRESTOR) 20 MG tablet TAKE 1 TABLET BY MOUTH  DAILY  . triamcinolone cream (KENALOG) 0.1 % Apply 1 application topically 2 (two) times daily.   No facility-administered encounter medications on file as of 05/17/2020.     Current Diagnosis/Assessment:   Emergency planning/management officer Strain: Low Risk   . Difficulty of Paying Living Expenses: Not very hard    Goals Addressed             This Visit's Progress   . Pharmacy Care Plan:       CARE PLAN ENTRY (see longitudinal plan of care for additional care plan information)  Current Barriers:  . Chronic Disease Management support, education, and care coordination needs related to Hypertension, Hyperlipidemia, and Diabetes   Hypertension BP Readings from Last 3 Encounters:  03/20/20 132/66  10/18/19 128/72  09/12/19 138/72   . Pharmacist Clinical Goal(s): o Over the next 90 days, patient will work with PharmD and providers to maintain BP goal <130/80 . Current regimen:  o Amlodipine 85m . Interventions: o Reviewed office BP o Discussed importance of increased physical activity . Patient self care activities - Over the next 90 days, patient will: o Check BP periodically, document, and provide at future appointments o Ensure daily salt intake < 2300 mg/day  Hyperlipidemia Lab Results  Component Value Date/Time   LDLCALC 101 (H) 03/20/2020 09:32 AM   . Pharmacist Clinical Goal(s): o Over the next 90 days, patient will work with PharmD and providers to achieve LDL goal < 70 . Current regimen:  o Rosuvastatin 237m. Interventions: o Discussed importance of statin medications o Reviewed most recent lipid panel o Encouraged continued exercise and diet modifications . Patient self care activities - Over the next 90 days, patient will: o Continue to takes medication as directed o Work to focus on diet low in fats and sugars  Diabetes Lab  Results  Component Value Date/Time   HGBA1C 9.4 (H) 03/20/2020 09:32 AM   HGBA1C 13.7 (H) 09/12/2019 03:19 PM   HGBA1C 10.2 (H) 04/10/2014 09:48 AM   HGBA1C 9.8 (H) 12/27/2013 08:45 AM   . Pharmacist Clinical Goal(s): o Over the next 90 days, patient will work with PharmD and providers to achieve A1c goal <8% . Current regimen:  . Levemir 100u/mL 36 units twice daily . Humalog 100u/mL . Interventions: o Evaluated cost burden of current medication  regimen o Discussed diet modifications specific to diabetes o Counseled on goals for fasting and post-prandial blood sugar o Recommended jardiance due to current LIS program assisting with cost . Patient self care activities - Over the next 90 days, patient will: o Check blood sugar 3 times per day, document, and provide at future appointments o Contact provider with any episodes of hypoglycemia o Contact provider should Jardiance not be affordable, if approved by PCP   Initial goal documentation        Hypertension   BP goal is:  <130/80  Office blood pressures are  BP Readings from Last 3 Encounters:  03/20/20 132/66  10/18/19 128/72  09/12/19 138/72   Patient checks BP at home infrequently Patient home BP readings are ranging:  No logs available  Patient has failed these meds in the past: lisinopril (swelling) Patient is currently controlled on the following medications:  . Amlodipine 42m  Denies excessive swelling, office BP always WNL.  Adherent to medication.  She reports walking twice daily to stop sign and back. She is very encouraged by her recent efforts with diet and exercise.  I commended her on these efforts and discussed ways to continue to improve.  Plan  Continue current medications     Diabetes   A1c goal <8%  Recent Relevant Labs: Lab Results  Component Value Date/Time   HGBA1C 9.4 (H) 03/20/2020 09:32 AM   HGBA1C 13.7 (H) 09/12/2019 03:19 PM   HGBA1C 10.2 (H) 04/10/2014 09:48 AM   HGBA1C 9.8 (H) 12/27/2013 08:45 AM   MICROALBUR 10.6 09/12/2019 03:19 PM   MICROALBUR 7.9 12/10/2018 12:07 PM    Last diabetic Eye exam:  Lab Results  Component Value Date/Time   HMDIABEYEEXA No Retinopathy 06/24/2018 12:00 AM    Last diabetic Foot exam: No results found for: HMDIABFOOTEX   Checking BG: 3x per Day  Recent FBG Readings: 200-250  Patient has failed these meds in past: Metformin (GI), Jardiance (cost) Patient is currently uncontrolled on the  following medications: . Levemir 100u/mL 36 units twice daily . Humalog 100u/mL  Patient states that she is getting assistance now through medicare which makes both copays on her insulin around $9.  I suspect she is getting LIS.  This means all of her medications should be no more than $9.  Her 7 day average BG is 233, her 14 day average is 255, and her 30 day average is 255.  She denies any episodes of hypoglycemia and is currently very compliant with insulin. These numbers pulled straight from her meter.  She had been on Jardiance in the past but stopped due to high cost.  Based on current readings and previous A1c I suspect she will need additional therapy to get her towards goal.  Will consult with Dr. DBuelah Manison recommendation of Jardiance since she is now on LIS.  Plan  Continue current medications, recommend initiation of Jardiance 233mdaily.  Patient to monitor blood sugar and contact with any hypoglycemia or cost concerns.  Hyperlipidemia   LDL goal < 70  Lipid Panel     Component Value Date/Time   CHOL 174 03/20/2020 0932   TRIG 95 03/20/2020 0932   HDL 54 03/20/2020 0932   LDLCALC 101 (H) 03/20/2020 0932    Hepatic Function Latest Ref Rng & Units 03/20/2020 09/12/2019 12/10/2018  Total Protein 6.1 - 8.1 g/dL 7.4 8.2(H) 7.7  Albumin 3.6 - 5.1 g/dL - - -  AST 10 - 35 U/L _0 ALT 6 - 29 U/L _1 Alk Phosphatase 33 - 130 U/L - - -  Total Bilirubin 0.2 - 1.2 mg/dL 0.4 0.3 0.3     The 10-year ASCVD risk score Mikey Bussing DC Jr., et al., 2013) is: 27%   Values used to calculate the score:     Age: 46 years     Sex: Female     Is Non-Hispanic African American: Yes     Diabetic: Yes     Tobacco smoker: No     Systolic Blood Pressure: 578 mmHg     Is BP treated: Yes     HDL Cholesterol: 54 mg/dL     Total Cholesterol: 174 mg/dL   Patient has failed these meds in past: none noted Patient is currently uncontrolled on the following medications:  . Rosuvastatin 93m Patient  adherent to statin medication.  Appropriate intensity, she is working hard on diet/exercise and encouraged her to continue this.  I suspect with increased efforts cholesterol will continue to improve.  She denies myalgias at this time.  Plan  Continue current medications  Osteoarthritis   Patient has failed these meds in past: none noted Patient is currently controlled on the following medications:  . Naproxen OTC  Patient reports pain controlled, takes Naproxen prn for pain.  Plan  Continue current medications. Osteopenia / Osteoporosis   Last DEXA Scan: 11-22-2019  T-Score femoral neck: -0.1    T-Score forearm radius: -0.3    No results found for: VD25OH   Patient is not a candidate for pharmacologic treatment  Patient has failed these meds in past: fosamax (cost) Patient is currently controlled on the following medications:  . Calcium/VitD  We discussed:  Recommend [phone] units of vitamin D daily. Recommend 1200 mg of calcium daily from dietary and supplemental sources. Recommend weight-bearing and muscle strengthening exercises for building and maintaining bone density.  Plan  Continue current medications Vaccines   Reviewed and discussed patient's vaccination history.    Immunization History  Administered Date(s) Administered  . Fluad Quad(high Dose 65+) 09/12/2019  . Influenza, High Dose Seasonal PF 06/30/2017  . Influenza,inj,Quad PF,6+ Mos 06/24/2013, 05/23/2014, 05/09/2015, 08/13/2016, 05/11/2018  . Moderna SARS-COVID-2 Vaccination 11/19/2019, 12/21/2019  . Pneumococcal Conjugate-13 02/06/2015  . Pneumococcal Polysaccharide-23 04/22/2016  . Tdap 08/31/2013    Plan  Recommended patient receive Shingles vaccine in office.  Medication Management   . Miscellaneous medications: amitriptyline 541mdaily . OTC's: ASA 8126mNaproxen 220m71mPatient currently uses [facility].   . Patient reports using no specific method to organize  medications and promote adherence. . Patient denies missed doses of medication.   ChriBeverly MilcharmD Clinical Pharmacist [phone]

## 2020-05-17 ENCOUNTER — Other Ambulatory Visit: Payer: Self-pay

## 2020-05-17 ENCOUNTER — Ambulatory Visit: Payer: Medicare Other | Admitting: Pharmacist

## 2020-05-17 DIAGNOSIS — I10 Essential (primary) hypertension: Secondary | ICD-10-CM

## 2020-05-17 DIAGNOSIS — E1143 Type 2 diabetes mellitus with diabetic autonomic (poly)neuropathy: Secondary | ICD-10-CM

## 2020-05-17 DIAGNOSIS — E782 Mixed hyperlipidemia: Secondary | ICD-10-CM

## 2020-05-17 NOTE — Patient Instructions (Addendum)
Visit Information Thank you for meeting with me today!  I look forward to working with you to help you meet all of your healthcare goals and answer any questions you may have.  Feel free to contact me anytime!  Goals Addressed            This Visit's Progress    Pharmacy Care Plan:       CARE PLAN ENTRY (see longitudinal plan of care for additional care plan information)  Current Barriers:   Chronic Disease Management support, education, and care coordination needs related to Hypertension, Hyperlipidemia, and Diabetes   Hypertension BP Readings from Last 3 Encounters:  03/20/20 132/66  10/18/19 128/72  09/12/19 138/72    Pharmacist Clinical Goal(s): o Over the next 90 days, patient will work with PharmD and providers to maintain BP goal <130/80  Current regimen:  o Amlodipine 10mg   Interventions: o Reviewed office BP o Discussed importance of increased physical activity  Patient self care activities - Over the next 90 days, patient will: o Check BP periodically, document, and provide at future appointments o Ensure daily salt intake < 2300 mg/day  Hyperlipidemia Lab Results  Component Value Date/Time   LDLCALC 101 (H) 03/20/2020 09:32 AM    Pharmacist Clinical Goal(s): o Over the next 90 days, patient will work with PharmD and providers to achieve LDL goal < 70  Current regimen:  o Rosuvastatin 20mg   Interventions: o Discussed importance of statin medications o Reviewed most recent lipid panel o Encouraged continued exercise and diet modifications  Patient self care activities - Over the next 90 days, patient will: o Continue to takes medication as directed o Work to focus on diet low in fats and sugars  Diabetes Lab Results  Component Value Date/Time   HGBA1C 9.4 (H) 03/20/2020 09:32 AM   HGBA1C 13.7 (H) 09/12/2019 03:19 PM   HGBA1C 10.2 (H) 04/10/2014 09:48 AM   HGBA1C 9.8 (H) 12/27/2013 08:45 AM    Pharmacist Clinical Goal(s): o Over the next  90 days, patient will work with PharmD and providers to achieve A1c goal <8%  Current regimen:   Levemir 100u/mL 36 units twice daily  Humalog 100u/mL  Interventions: o Evaluated cost burden of current medication regimen o Discussed diet modifications specific to diabetes o Counseled on goals for fasting and post-prandial blood sugar o Recommended jardiance due to current LIS program assisting with cost  Patient self care activities - Over the next 90 days, patient will: o Check blood sugar 3 times per day, document, and provide at future appointments o Contact provider with any episodes of hypoglycemia o Contact provider should Jardiance not be affordable, if approved by PCP   Initial goal documentation        Ms. Liska was given information about Chronic Care Management services today including:  1. CCM service includes personalized support from designated clinical staff supervised by her physician, including individualized plan of care and coordination with other care providers 2. 24/7 contact phone numbers for assistance for urgent and routine care needs. 3. Standard insurance, coinsurance, copays and deductibles apply for chronic care management only during months in which we provide at least 20 minutes of these services. Most insurances cover these services at 100%, however patients may be responsible for any copay, coinsurance and/or deductible if applicable. This service may help you avoid the need for more expensive face-to-face services. 4. Only one practitioner may furnish and bill the service in a calendar month. 5. The patient may stop  CCM services at any time (effective at the end of the month) by phone call to the office staff.  Patient agreed to services and verbal consent obtained.   The patient verbalized understanding of instructions provided today and agreed to receive a mailed copy of patient instruction and/or educational materials. Telephone follow up  appointment with pharmacy team member scheduled for: 3 months  Beverly Milch, PharmD Clinical Pharmacist [place] [phone]   Carbohydrate Counting for Diabetes Mellitus, Adult  Carbohydrate counting is a method of keeping track of how many carbohydrates you eat. Eating carbohydrates naturally increases the amount of sugar (glucose) in the blood. Counting how many carbohydrates you eat helps keep your blood glucose within normal limits, which helps you manage your diabetes (diabetes mellitus). It is important to know how many carbohydrates you can safely have in each meal. This is different for every person. A diet and nutrition specialist (registered dietitian) can help you make a meal plan and calculate how many carbohydrates you should have at each meal and snack. Carbohydrates are found in the following foods:  Grains, such as breads and cereals.  Dried beans and soy products.  Starchy vegetables, such as potatoes, peas, and corn.  Fruit and fruit juices.  Milk and yogurt.  Sweets and snack foods, such as cake, cookies, candy, chips, and soft drinks. How do I count carbohydrates? There are two ways to count carbohydrates in food. You can use either of the methods or a combination of both. Reading "Nutrition Facts" on packaged food The "Nutrition Facts" list is included on the labels of almost all packaged foods and beverages in the [place] It includes:  The serving size.  Information about nutrients in each serving, including the grams (g) of carbohydrate per serving. To use the Nutrition Facts":  Decide how many servings you will have.  Multiply the number of servings by the number of carbohydrates per serving.  The resulting number is the total amount of carbohydrates that you will be having. Learning standard serving sizes of other foods When you eat carbohydrate foods that are not packaged or do not include "Nutrition Facts" on the label, you  need to measure the servings in order to count the amount of carbohydrates:  Measure the foods that you will eat with a food scale or measuring cup, if needed.  Decide how many standard-size servings you will eat.  Multiply the number of servings by 15. Most carbohydrate-rich foods have about 15 g of carbohydrates per serving. ? For example, if you eat 8 oz (170 g) of strawberries, you will have eaten 2 servings and 30 g of carbohydrates (2 servings x 15 g = 30 g).  For foods that have more than one food mixed, such as soups and casseroles, you must count the carbohydrates in each food that is included. The following list contains standard serving sizes of common carbohydrate-rich foods. Each of these servings has about 15 g of carbohydrates:   hamburger bun or  English muffin.   oz (15 mL) syrup.   oz (14 g) jelly.  1 slice of bread.  1 six-inch tortilla.  3 oz (85 g) cooked rice or pasta.  4 oz (113 g) cooked dried beans.  4 oz (113 g) starchy vegetable, such as peas, corn, or potatoes.  4 oz (113 g) hot cereal.  4 oz (113 g) mashed potatoes or  of a large baked potato.  4 oz (113 g) canned or frozen fruit.  4  oz (120 mL) fruit juice.  4-6 crackers.  6 chicken nuggets.  6 oz (170 g) unsweetened dry cereal.  6 oz (170 g) plain fat-free yogurt or yogurt sweetened with artificial sweeteners.  8 oz (240 mL) milk.  8 oz (170 g) fresh fruit or one small piece of fruit.  24 oz (680 g) popped popcorn. Example of carbohydrate counting Sample meal  3 oz (85 g) chicken breast.  6 oz (170 g) brown rice.  4 oz (113 g) corn.  8 oz (240 mL) milk.  8 oz (170 g) strawberries with sugar-free whipped topping. Carbohydrate calculation 1. Identify the foods that contain carbohydrates: ? Rice. ? Corn. ? Milk. ? Strawberries. 2. Calculate how many servings you have of each food: ? 2 servings rice. ? 1 serving corn. ? 1 serving milk. ? 1 serving  strawberries. 3. Multiply each number of servings by 15 g: ? 2 servings rice x 15 g = 30 g. ? 1 serving corn x 15 g = 15 g. ? 1 serving milk x 15 g = 15 g. ? 1 serving strawberries x 15 g = 15 g. 4. Add together all of the amounts to find the total grams of carbohydrates eaten: ? 30 g + 15 g + 15 g + 15 g = 75 g of carbohydrates total. Summary  Carbohydrate counting is a method of keeping track of how many carbohydrates you eat.  Eating carbohydrates naturally increases the amount of sugar (glucose) in the blood.  Counting how many carbohydrates you eat helps keep your blood glucose within normal limits, which helps you manage your diabetes.  A diet and nutrition specialist (registered dietitian) can help you make a meal plan and calculate how many carbohydrates you should have at each meal and snack. This information is not intended to replace advice given to you by your health care provider. Make sure you discuss any questions you have with your health care provider. Document Revised: 03/19/2017 Document Reviewed: 02/06/2016 Elsevier Patient Education  2020 [place] for Diabetes Mellitus, Adult  Carbohydrate counting is a method of keeping track of how many carbohydrates you eat. Eating carbohydrates naturally increases the amount of sugar (glucose) in the blood. Counting how many carbohydrates you eat helps keep your blood glucose within normal limits, which helps you manage your diabetes (diabetes mellitus). It is important to know how many carbohydrates you can safely have in each meal. This is different for every person. A diet and nutrition specialist (registered dietitian) can help you make a meal plan and calculate how many carbohydrates you should have at each meal and snack. Carbohydrates are found in the following foods:  Grains, such as breads and cereals.  Dried beans and soy products.  Starchy vegetables, such as potatoes, peas, and  corn.  Fruit and fruit juices.  Milk and yogurt.  Sweets and snack foods, such as cake, cookies, candy, chips, and soft drinks. How do I count carbohydrates? There are two ways to count carbohydrates in food. You can use either of the methods or a combination of both. Reading "Nutrition Facts" on packaged food The "Nutrition Facts" list is included on the labels of almost all packaged foods and beverages in the [place] It includes:  The serving size.  Information about nutrients in each serving, including the grams (g) of carbohydrate per serving. To use the Nutrition Facts":  Decide how many servings you will have.  Multiply the number of servings by the number of  carbohydrates per serving.  The resulting number is the total amount of carbohydrates that you will be having. Learning standard serving sizes of other foods When you eat carbohydrate foods that are not packaged or do not include "Nutrition Facts" on the label, you need to measure the servings in order to count the amount of carbohydrates:  Measure the foods that you will eat with a food scale or measuring cup, if needed.  Decide how many standard-size servings you will eat.  Multiply the number of servings by 15. Most carbohydrate-rich foods have about 15 g of carbohydrates per serving. ? For example, if you eat 8 oz (170 g) of strawberries, you will have eaten 2 servings and 30 g of carbohydrates (2 servings x 15 g = 30 g).  For foods that have more than one food mixed, such as soups and casseroles, you must count the carbohydrates in each food that is included. The following list contains standard serving sizes of common carbohydrate-rich foods. Each of these servings has about 15 g of carbohydrates:   hamburger bun or  English muffin.   oz (15 mL) syrup.   oz (14 g) jelly.  1 slice of bread.  1 six-inch tortilla.  3 oz (85 g) cooked rice or pasta.  4 oz (113 g) cooked dried beans.  4 oz (113 g)  starchy vegetable, such as peas, corn, or potatoes.  4 oz (113 g) hot cereal.  4 oz (113 g) mashed potatoes or  of a large baked potato.  4 oz (113 g) canned or frozen fruit.  4 oz (120 mL) fruit juice.  4-6 crackers.  6 chicken nuggets.  6 oz (170 g) unsweetened dry cereal.  6 oz (170 g) plain fat-free yogurt or yogurt sweetened with artificial sweeteners.  8 oz (240 mL) milk.  8 oz (170 g) fresh fruit or one small piece of fruit.  24 oz (680 g) popped popcorn. Example of carbohydrate counting Sample meal  3 oz (85 g) chicken breast.  6 oz (170 g) brown rice.  4 oz (113 g) corn.  8 oz (240 mL) milk.  8 oz (170 g) strawberries with sugar-free whipped topping. Carbohydrate calculation 5. Identify the foods that contain carbohydrates: ? Rice. ? Corn. ? Milk. ? Strawberries. 6. Calculate how many servings you have of each food: ? 2 servings rice. ? 1 serving corn. ? 1 serving milk. ? 1 serving strawberries. 7. Multiply each number of servings by 15 g: ? 2 servings rice x 15 g = 30 g. ? 1 serving corn x 15 g = 15 g. ? 1 serving milk x 15 g = 15 g. ? 1 serving strawberries x 15 g = 15 g. 8. Add together all of the amounts to find the total grams of carbohydrates eaten: ? 30 g + 15 g + 15 g + 15 g = 75 g of carbohydrates total. Summary  Carbohydrate counting is a method of keeping track of how many carbohydrates you eat.  Eating carbohydrates naturally increases the amount of sugar (glucose) in the blood.  Counting how many carbohydrates you eat helps keep your blood glucose within normal limits, which helps you manage your diabetes.  A diet and nutrition specialist (registered dietitian) can help you make a meal plan and calculate how many carbohydrates you should have at each meal and snack. This information is not intended to replace advice given to you by your health care provider. Make sure you discuss any questions you  have with your health care  provider. Document Revised: 03/19/2017 Document Reviewed: 02/06/2016 Elsevier Patient Education  [place].

## 2020-05-18 ENCOUNTER — Other Ambulatory Visit: Payer: Self-pay | Admitting: Family Medicine

## 2020-05-18 DIAGNOSIS — Z794 Long term (current) use of insulin: Secondary | ICD-10-CM

## 2020-05-20 ENCOUNTER — Other Ambulatory Visit: Payer: Self-pay | Admitting: Family Medicine

## 2020-05-20 MED ORDER — EMPAGLIFLOZIN 10 MG PO TABS: 10.0000 mg | ORAL_TABLET | Freq: Every day | ORAL | 2 refills | Status: DC

## 2020-05-20 NOTE — Progress Notes (Signed)
Pt now able to afford [facility] note Will restart at 10mg  once a day in addition to insulin

## 2020-05-21 ENCOUNTER — Telehealth: Payer: Self-pay | Admitting: Pharmacist

## 2020-05-21 NOTE — Telephone Encounter (Signed)
-----   Message from Alycia Rossetti, MD sent at 05/20/2020 10:55 AM EDT ----- If the cost/ financial issue has been worked out then she can restart jardiance at 10mg .  She did not have any issues, just couldn't afford any of her meds  consistently.  I will send to pharmacy, if you can let her know, add that in, don't change the insulin right now     [facility]   ----- Message ----- From: Edythe Clarity, [facility] Sent: 05/17/2020   2:09 PM EDT To: Alycia Rossetti, MD  Sue Wood was in for CCM today.  She said she has no issues affording her medication anymore, she must have gotten LIS through medicare because her copays on insulin are $9.  However, she did report most fasting blood sugars are > 200 still.  Since her medications are more affordable now I wanted to see your thought on restarting Jardiance for her?  It should be along the same lines as her insulin copays through LIS.  Thanks!

## 2020-05-21 NOTE — Chronic Care Management (AMB) (Signed)
Called patient she is aware and agreeable to plan.  Beverly Milch, PharmD Clinical Pharmacist [place] [phone]

## 2020-05-31 ENCOUNTER — Other Ambulatory Visit: Payer: Self-pay | Admitting: Family Medicine

## 2020-06-07 ENCOUNTER — Other Ambulatory Visit: Payer: Self-pay | Admitting: Family Medicine

## 2020-06-07 DIAGNOSIS — E1143 Type 2 diabetes mellitus with diabetic autonomic (poly)neuropathy: Secondary | ICD-10-CM

## 2020-06-11 ENCOUNTER — Other Ambulatory Visit: Payer: Self-pay | Admitting: Family Medicine

## 2020-06-11 DIAGNOSIS — Z794 Long term (current) use of insulin: Secondary | ICD-10-CM

## 2020-06-25 ENCOUNTER — Other Ambulatory Visit: Payer: Self-pay | Admitting: Family Medicine

## 2020-06-25 DIAGNOSIS — E1143 Type 2 diabetes mellitus with diabetic autonomic (poly)neuropathy: Secondary | ICD-10-CM

## 2020-06-25 DIAGNOSIS — Z794 Long term (current) use of insulin: Secondary | ICD-10-CM

## 2020-07-17 ENCOUNTER — Ambulatory Visit: Payer: Self-pay | Admitting: Pharmacist

## 2020-07-17 NOTE — Chronic Care Management (AMB) (Signed)
  Chronic Care Management   Outreach Note  07/17/2020 Name: Sue Wood MRN: [id number] DOB: May 31, 1946  Referred by: Alycia Rossetti, MD Reason for referral : No chief complaint on file.   An unsuccessful telephone outreach was attempted today. The patient was referred to the pharmacist for assistance with care management and care coordination.   Follow Up Plan: CMA DM call f/u  Beverly Milch, PharmD Clinical Pharmacist [facility] [phone]

## 2020-07-22 ENCOUNTER — Other Ambulatory Visit: Payer: Self-pay | Admitting: Family Medicine

## 2020-07-22 DIAGNOSIS — E1143 Type 2 diabetes mellitus with diabetic autonomic (poly)neuropathy: Secondary | ICD-10-CM

## 2020-07-27 ENCOUNTER — Ambulatory Visit: Payer: Self-pay | Admitting: Pharmacist

## 2020-07-27 NOTE — Chronic Care Management (AMB) (Signed)
Chronic Care Management   Follow Up Note   07/27/2020 Name: Sue Wood MRN: [id number] DOB: November 10, 1945  Referred by: Alycia Rossetti, MD Reason for referral : No chief complaint on file.   Sue Wood is a 74 y.o. year old female who is a primary care patient of Pearl, Modena Nunnery, MD. The CCM team was consulted for assistance with chronic disease management and care coordination needs.    Review of patient status, including review of consultants reports, relevant laboratory and other test results, and collaboration with appropriate care team members and the patient's provider was performed as part of comprehensive patient evaluation and provision of chronic care management services.    SDOH (Social Determinants of Health) assessments performed: No See Care Plan activities for detailed interventions related to [facility])     Outpatient Encounter Medications as of 07/27/2020  Medication Sig  . Alcohol Swabs 70 % PADS Use 1-4 times daily prn  . amitriptyline (ELAVIL) 50 MG tablet TAKE 1 TABLET BY MOUTH AT  BEDTIME  . amLODipine (NORVASC) 10 MG tablet TAKE 1 TABLET BY MOUTH  DAILY  . aspirin 81 MG tablet Take 1 tablet (81 mg total) by mouth at bedtime.  . Blood Glucose Monitoring Suppl (BLOOD GLUCOSE SYSTEM PAK) KIT Please dispense based on patient and insurance preference. Use as directed to monitor FSBS 3x daily. Dx: E11.9.  Marland Kitchen empagliflozin (JARDIANCE) 10 MG TABS tablet Take 1 tablet (10 mg total) by mouth daily before breakfast.  . HUMALOG KWIKPEN 100 UNIT/ML KwikPen INJECT  12 UNITS SUBCUTANEOUSLY WITH EACH MEAL  . Lancets MISC Please dispense based on patient and insurance preference. Use as directed to monitor FSBS 3x daily. Dx: E11.9.  . LEVEMIR FLEXTOUCH 100 UNIT/ML FlexPen INJECT 36 UNITS SUBCUTANEOUSLY TWICE DAILY  . ONETOUCH ULTRA test strip USE TO MONITOR FASTING  BLOOD SUGAR 3 TIMES DAILY  AS DIRECTED  . rosuvastatin (CRESTOR) 20 MG tablet TAKE 1 TABLET BY MOUTH   DAILY  . triamcinolone cream (KENALOG) 0.1 % Apply 1 application topically 2 (two) times daily.   No facility-administered encounter medications on file as of 07/27/2020.     Objective:  PharmD follow up on blood sugar since initiation of Jardiance $RemoveBefo'10mg'kpxgJepnACq$ .  Goals Addressed   None     There are no care plans to display for this patient.  Diabetes   A1c goal < 8%  Recent Relevant Labs: Lab Results  Component Value Date/Time   HGBA1C 9.4 (H) 03/20/2020 09:32 AM   HGBA1C 13.7 (H) 09/12/2019 03:19 PM   HGBA1C 10.2 (H) 04/10/2014 09:48 AM   HGBA1C 9.8 (H) 12/27/2013 08:45 AM   MICROALBUR 10.6 09/12/2019 03:19 PM   MICROALBUR 7.9 12/10/2018 12:07 PM    Last diabetic Eye exam:  Lab Results  Component Value Date/Time   HMDIABEYEEXA No Retinopathy 06/24/2018 12:00 AM    Last diabetic Foot exam: No results found for: HMDIABFOOTEX   Checking BG: Daily  Recent FBG Readings: 179-190 per patient recently   Patient has failed these meds in past: none noted Patient is currently uncontrolled on the following medications: . Jardiance $RemoveBef'10mg'igYcqVNHZC$  . Levemir 36 units twice daily . Humalog 100u/ml 12 units tid with meals  We discussed:   Patient was able to start Jardiance as it is affordable and states it has improved her blood sugar  Based on home readings, patients blood sugar still elevated  She has not been in for updated A1c, recommended she schedule this ASAP so that we  can get a good idea of where she is trending.  If A1c still > 8.0 I would recommend increasing Jardiance to $RemoveBefo'25mg'tyJKmMRPuPq$  daily, will follow up in 30 days to see if patient has scheduled this and to evaluate blood sugars.  Plan  Continue current medications  Need updated A1c, plan to titrate Jardiance based of next reading   Follow Up:  4 week CPA call  The patient has been provided with contact information for the care management team and has been advised to call with any health related questions or concerns.     Beverly Milch, PharmD Clinical Pharmacist [place] [phone]

## 2020-08-11 ENCOUNTER — Other Ambulatory Visit: Payer: Self-pay | Admitting: Family Medicine

## 2020-08-11 DIAGNOSIS — E1143 Type 2 diabetes mellitus with diabetic autonomic (poly)neuropathy: Secondary | ICD-10-CM

## 2020-08-15 ENCOUNTER — Ambulatory Visit (INDEPENDENT_AMBULATORY_CARE_PROVIDER_SITE_OTHER): Payer: Medicare Other | Admitting: Family Medicine

## 2020-08-15 ENCOUNTER — Other Ambulatory Visit: Payer: Self-pay

## 2020-08-15 ENCOUNTER — Encounter: Payer: Self-pay | Admitting: Family Medicine

## 2020-08-15 VITALS — BP 122/60 | HR 90 | Temp 98.1°F | Resp 16 | Ht 67.0 in | Wt 215.0 lb

## 2020-08-15 DIAGNOSIS — Z23 Encounter for immunization: Secondary | ICD-10-CM

## 2020-08-15 DIAGNOSIS — I10 Essential (primary) hypertension: Secondary | ICD-10-CM | POA: Diagnosis not present

## 2020-08-15 DIAGNOSIS — Z794 Long term (current) use of insulin: Secondary | ICD-10-CM | POA: Diagnosis not present

## 2020-08-15 DIAGNOSIS — E782 Mixed hyperlipidemia: Secondary | ICD-10-CM | POA: Diagnosis not present

## 2020-08-15 DIAGNOSIS — E1141 Type 2 diabetes mellitus with diabetic mononeuropathy: Secondary | ICD-10-CM | POA: Diagnosis not present

## 2020-08-15 DIAGNOSIS — Z6833 Body mass index (BMI) 33.0-33.9, adult: Secondary | ICD-10-CM

## 2020-08-15 DIAGNOSIS — E1143 Type 2 diabetes mellitus with diabetic autonomic (poly)neuropathy: Secondary | ICD-10-CM

## 2020-08-15 DIAGNOSIS — E6609 Other obesity due to excess calories: Secondary | ICD-10-CM

## 2020-08-15 NOTE — Assessment & Plan Note (Signed)
Intentional weight loss with dietary changes

## 2020-08-15 NOTE — Assessment & Plan Note (Signed)
Controlled no changes 

## 2020-08-15 NOTE — Assessment & Plan Note (Addendum)
Pt did not bring meter but overall A1C has been improving Goal is < 8% at her age and with chronic uncontrolled DM She is now able to afford all of her meds consistently and has cut out sugars/snack food Able to access some healthier foods Scheduled for eye exam this month Flu shot given  Recommend she F/U with NP in 4 months and then transition to MD

## 2020-08-15 NOTE — Patient Instructions (Signed)
F/U 4 months Sue Wood (NP)

## 2020-08-15 NOTE — Assessment & Plan Note (Signed)
Goal LDL less than 100, closer to 70 preferable Continue crestor, dietary changes

## 2020-08-15 NOTE — Progress Notes (Signed)
   Subjective:    Patient ID: Sue Wood, female    DOB: 17-Feb-1946, 74 y.o.   MRN: [id number]  Patient presents for Follow-up (is fasting) Pt here to f/u chronic medications  She is taking levemir 36 units, humalog 10 units with meals  And jardiance   last A1C 9.4%  CBG this AM 190, she did not bring her meter   no hypoglycemia symptoms   Her weight is down 10lbs since July. She has not been as many desserts and snacks. Her family has been sending her pre-made meals that she can microwave or put in the oven   Hyperlipidemia last LDL 101 in July   Due forFlu shot today   Eye exam scheduled for - [facility] in [place] Dec 21st   Review Of Systems:  GEN- denies fatigue, fever, weight loss,weakness, recent illness HEENT- denies eye drainage, change in vision, nasal discharge, CVS- denies chest pain, palpitations RESP- denies SOB, cough, wheeze ABD- denies N/V, change in stools, abd pain GU- denies dysuria, hematuria, dribbling, incontinence MSK- denies joint pain, muscle aches, injury Neuro- denies headache, dizziness, syncope, seizure activity       Objective:    BP 122/60   Pulse 90   Temp 98.1 F (36.7 C) (Temporal)   Resp 16   Ht 5\' 7"  (1.702 m)   Wt 215 lb (97.5 kg)   SpO2 97%   BMI 33.67 kg/m  GEN- NAD, alert and oriented x3 HEENT- PERRL, EOMI, non injected sclera, pink conjunctiva, MMM, oropharynx clear Neck- Supple, no thyromegaly CVS- RRR, no murmur RESP-CTAB ABD-NABS,soft,NT,ND EXT- No edema Pulses- Radial, DP- 2+        Assessment & Plan:      Problem List Items Addressed This Visit      Unprioritized   Diabetes mellitus with neurological manifestation (HCC)    Pt did not bring meter but overall A1C has been improving Goal is < 8% at her age and with chronic uncontrolled DM She is now able to afford all of her meds consistently and has cut out sugars/snack food Able to access some healthier foods Scheduled for eye exam this  month Flu shot given  Recommend she F/U with NP in 4 months and then transition to MD as needed       Relevant Orders   Hemoglobin A1c   Microalbumin / creatinine urine ratio   Diabetic neuropathy ([place])   Essential hypertension, benign    Controlled no changes       Relevant Orders   CBC with Differential/Platelet   Comprehensive metabolic panel   Hyperlipidemia    Goal LDL less than 100, closer to 70 preferable Continue crestor, dietary changes       Relevant Orders   Lipid panel   Obesity    Intentional weight loss with dietary changes        Other Visit Diagnoses    Need for immunization against influenza    -  Primary   Relevant Orders   Flu Vaccine QUAD High Dose(Fluad) (Completed)      Note: This dictation was prepared with Dragon dictation along with smaller phrase technology. Any transcriptional errors that result from this process are unintentional.

## 2020-08-16 LAB — COMPREHENSIVE METABOLIC PANEL
AG Ratio: 1.3 (calc) (ref 1.0–2.5)
ALT: 15 U/L (ref 6–29)
AST: 17 U/L (ref 10–35)
Albumin: 4.2 g/dL (ref 3.6–5.1)
Alkaline phosphatase (APISO): 71 U/L (ref 37–153)
BUN: 15 mg/dL (ref 7–25)
CO2: 25 mmol/L (ref 20–32)
Calcium: 9.5 mg/dL (ref 8.6–10.4)
Chloride: 104 mmol/L (ref 98–110)
Creat: 0.85 mg/dL (ref 0.60–0.93)
Globulin: 3.3 g/dL (calc) (ref 1.9–3.7)
Glucose, Bld: 183 mg/dL — ABNORMAL HIGH (ref 65–99)
Potassium: 4.1 mmol/L (ref 3.5–5.3)
Sodium: 139 mmol/L (ref 135–146)
Total Bilirubin: 0.3 mg/dL (ref 0.2–1.2)
Total Protein: 7.5 g/dL (ref 6.1–8.1)

## 2020-08-16 LAB — CBC WITH DIFFERENTIAL/PLATELET
Absolute Monocytes: 229 cells/uL (ref 200–950)
Basophils Absolute: 42 cells/uL (ref 0–200)
Basophils Relative: 0.8 %
Eosinophils Absolute: 120 cells/uL (ref 15–500)
Eosinophils Relative: 2.3 %
HCT: 38.6 % (ref 35.0–45.0)
Hemoglobin: 13.1 g/dL (ref 11.7–15.5)
Lymphs Abs: 1617 cells/uL (ref 850–3900)
MCH: 29.2 pg (ref 27.0–33.0)
MCHC: 33.9 g/dL (ref 32.0–36.0)
MCV: 86 fL (ref 80.0–100.0)
MPV: 11.4 fL (ref 7.5–12.5)
Monocytes Relative: 4.4 %
Neutro Abs: 3193 cells/uL (ref 1500–7800)
Neutrophils Relative %: 61.4 %
Platelets: 247 10*3/uL (ref 140–400)
RBC: 4.49 10*6/uL (ref 3.80–5.10)
RDW: 14.4 % (ref 11.0–15.0)
Total Lymphocyte: 31.1 %
WBC: 5.2 10*3/uL (ref 3.8–10.8)

## 2020-08-16 LAB — MICROALBUMIN / CREATININE URINE RATIO
Creatinine, Urine: 77 mg/dL (ref 20–275)
Microalb Creat Ratio: 122 mcg/mg creat — ABNORMAL HIGH (ref <30)
Microalb, Ur: 9.4 mg/dL

## 2020-08-16 LAB — LIPID PANEL
Cholesterol: 204 mg/dL — ABNORMAL HIGH (ref <200)
HDL: 60 mg/dL (ref >=50)
LDL Cholesterol (Calc): 123 mg/dL (calc) — ABNORMAL HIGH
Non-HDL Cholesterol (Calc): 144 mg/dL (calc) — ABNORMAL HIGH (ref <130)
Total CHOL/HDL Ratio: 3.4 (calc) (ref <5.0)
Triglycerides: 99 mg/dL (ref <150)

## 2020-08-16 LAB — HEMOGLOBIN A1C
Hgb A1c MFr Bld: 9.7 % of total Hgb — ABNORMAL HIGH (ref <5.7)
Mean Plasma Glucose: 232 mg/dL
eAG (mmol/L): 12.8 mmol/L

## 2020-08-20 ENCOUNTER — Ambulatory Visit: Payer: Self-pay | Admitting: Pharmacist

## 2020-08-20 NOTE — Chronic Care Management (AMB) (Signed)
Chronic Care Management   Follow Up Note   08/20/2020 Name: Sue Wood MRN: [id number] DOB: 12-09-1945  Referred by: Alycia Rossetti, MD Reason for referral : No chief complaint on file.   Sue Wood is a 74 y.o. year old female who is a primary care patient of [place], Modena Nunnery, MD. The CCM team was consulted for assistance with chronic disease management and care coordination needs.    Review of patient status, including review of consultants reports, relevant laboratory and other test results, and collaboration with appropriate care team members and the patient's provider was performed as part of comprehensive patient evaluation and provision of chronic care management services.    SDOH (Social Determinants of Health) assessments performed: No See Care Plan activities for detailed interventions related to [facility])     Outpatient Encounter Medications as of 08/20/2020  Medication Sig  . Alcohol Swabs 70 % PADS Use 1-4 times daily prn  . amitriptyline (ELAVIL) 50 MG tablet TAKE 1 TABLET BY MOUTH AT  BEDTIME  . amLODipine (NORVASC) 10 MG tablet TAKE 1 TABLET BY MOUTH  DAILY  . aspirin 81 MG tablet Take 1 tablet (81 mg total) by mouth at bedtime.  . Blood Glucose Monitoring Suppl (BLOOD GLUCOSE SYSTEM PAK) KIT Please dispense based on patient and insurance preference. Use as directed to monitor FSBS 3x daily. Dx: E11.9.  Marland Kitchen empagliflozin (JARDIANCE) 10 MG TABS tablet Take 1 tablet (10 mg total) by mouth daily before breakfast.  . HUMALOG KWIKPEN 100 UNIT/ML KwikPen INJECT  12 UNITS SUBCUTANEOUSLY WITH EACH MEAL  . Lancets MISC Please dispense based on patient and insurance preference. Use as directed to monitor FSBS 3x daily. Dx: E11.9.  . LEVEMIR FLEXTOUCH 100 UNIT/ML FlexPen INJECT 36 UNITS SUBCUTANEOUSLY TWICE DAILY  . ONETOUCH ULTRA test strip USE TO MONITOR FASTING  BLOOD SUGAR 3 TIMES DAILY  AS DIRECTED  . rosuvastatin (CRESTOR) 20 MG tablet TAKE 1 TABLET BY MOUTH   DAILY  . triamcinolone cream (KENALOG) 0.1 % Apply 1 application topically 2 (two) times daily.   No facility-administered encounter medications on file as of 08/20/2020.     Recent Relevant Labs: Lab Results  Component Value Date/Time   HGBA1C 9.7 (H) 08/15/2020 08:34 AM   HGBA1C 9.4 (H) 03/20/2020 09:32 AM   HGBA1C 10.2 (H) 04/10/2014 09:48 AM   HGBA1C 9.8 (H) 12/27/2013 08:45 AM   MICROALBUR 9.4 08/15/2020 08:34 AM   MICROALBUR 10.6 09/12/2019 03:19 PM    Kidney Function Lab Results  Component Value Date/Time   CREATININE 0.85 08/15/2020 08:34 AM   CREATININE 0.86 03/20/2020 09:32 AM   GFRNONAA 63 04/10/2014 09:48 AM   GFRAA 73 04/10/2014 09:48 AM   Reviewed chart ahead of call for DM.  Recent OV's: 08/15/2020 [facility]) - A1c up to 9.7, Levemir was increased to 38 units bid and all other medications continued.  Lipids also increased and patient was to either take Crestor if non-adherence was an issue or increase to 53m once daily.   . Current antihyperglycemic regimen:  o Levemir 38 units twice daily o Humalog 100/ml 10 units tid o Jardiance 152mdaily . What recent interventions/DTPs have been made to improve glycemic control:  o Levemir recently increased to 38 units twice daily . Have there been any recent hospitalizations or ED visits since last visit with CPP? No . Patient denies hypoglycemic symptoms, including Pale, Sweaty, Shaky and Hungry . Patient denies hyperglycemic symptoms, including blurry vision, excessive thirst, fatigue and  polyuria . How often are you checking your blood sugar? 3-4 times daily . What are your blood sugars ranging?  o Fasting: 180s-190  o After meals: low 200-210  . During the week, how often does your blood glucose drop below 70? Never . Are you checking your feet daily/regularly? Yes  Adherence Review: Is the patient currently on a STATIN medication? Yes Is the patient currently on ACE/ARB medication? No - patient experienced  angioedema on lisinopril Does the patient have >5 day gap between last estimated fill dates? No  Contacted patient for diabetes monitoring and follow up.  She reports blood sugars out of range, but has not started new dose of Levemir just yet.  Her diet recall is as follows: B - bacon and eggs, L- sandwich and chips, D- beans, creamed potatoes, and a meat.  Beverages include mostly water, she denies soda intake.  We discussed her intake of carbohydrates and effect on blood sugar.  I have asked patient to start the new dose of Levemir and record for one to two weeks what she is eating in a day and what her sugar levels are after that meal.  Will follow up with her right before the holidays to see how she is doing on this new dose.  She is to really work on servings of carbohydrates and sugars until next check in.  If she remains elevated would recommend increase of Jardiance to the full dose of 75m daily.  She also reports that she had been taking Rosuvastatin 236mbefore most recent lipid panel.  She was taking it daily.  Per PCP note on lab she will need increased dose of 4052maily, will request Rx from nurse.  ChrBeverly MilchharmD Clinical Pharmacist [phone]

## 2020-08-23 ENCOUNTER — Other Ambulatory Visit: Payer: Self-pay

## 2020-08-23 MED ORDER — ROSUVASTATIN CALCIUM 40 MG PO TABS: 40.0000 mg | ORAL_TABLET | Freq: Every day | ORAL | 3 refills | Status: DC

## 2020-08-28 DIAGNOSIS — H524 Presbyopia: Secondary | ICD-10-CM | POA: Diagnosis not present

## 2020-08-28 DIAGNOSIS — E119 Type 2 diabetes mellitus without complications: Secondary | ICD-10-CM | POA: Diagnosis not present

## 2020-09-02 ENCOUNTER — Other Ambulatory Visit: Payer: Self-pay | Admitting: Family Medicine

## 2020-09-02 DIAGNOSIS — E1143 Type 2 diabetes mellitus with diabetic autonomic (poly)neuropathy: Secondary | ICD-10-CM

## 2020-09-14 NOTE — Chronic Care Management (AMB) (Signed)
Chronic Care Management   Follow Up Note   09/18/2020 Name: Sue Wood MRN: [id number] DOB: [birth year]  Referred by: Sue Scarlet, MD Reason for referral : Chronic Care Management (PharmD follow up visit)   ESVEIDY BON is a 75 y.o. year old female who is a primary care patient of [place], Sue Hatchet, MD. The CCM team was consulted for assistance with chronic disease management and care coordination needs.    Review of patient status, including review of consultants reports, relevant laboratory and other test results, and collaboration with appropriate care team members and the patient's provider was performed as part of comprehensive patient evaluation and provision of chronic care management services.    SDOH (Social Determinants of Health) assessments performed: No See Care Plan activities for detailed interventions related to [name])     Outpatient Encounter Medications as of 09/18/2020  Medication Sig  . Alcohol Swabs 70 % PADS Use 1-4 times daily prn  . amitriptyline (ELAVIL) 50 MG tablet TAKE 1 TABLET BY MOUTH AT  BEDTIME  . amLODipine (NORVASC) 10 MG tablet TAKE 1 TABLET BY MOUTH  DAILY  . aspirin 81 MG tablet Take 1 tablet (81 mg total) by mouth at bedtime.  . Blood Glucose Monitoring Suppl (BLOOD GLUCOSE SYSTEM PAK) KIT Please dispense based on patient and insurance preference. Use as directed to monitor FSBS 3x daily. Dx: E11.9.  Marland Kitchen empagliflozin (JARDIANCE) 10 MG TABS tablet Take 1 tablet (10 mg total) by mouth daily before breakfast.  . HUMALOG KWIKPEN 100 UNIT/ML KwikPen INJECT  12 UNITS SUBCUTANEOUSLY WITH EACH MEAL  . Lancets MISC Please dispense based on patient and insurance preference. Use as directed to monitor FSBS 3x daily. Dx: E11.9.  . LEVEMIR FLEXTOUCH 100 UNIT/ML FlexPen INJECT 36 UNITS SUBCUTANEOUSLY TWICE DAILY  . ONETOUCH ULTRA test strip USE TO MONITOR FASTING  BLOOD SUGAR 3 TIMES DAILY  AS DIRECTED  . rosuvastatin (CRESTOR) 20 MG tablet TAKE 1  TABLET BY MOUTH  DAILY  . rosuvastatin (CRESTOR) 40 MG tablet Take 1 tablet (40 mg total) by mouth daily.  Marland Kitchen triamcinolone cream (KENALOG) 0.1 % Apply 1 application topically 2 (two) times daily.   No facility-administered encounter medications on file as of 09/18/2020.    Recent OV's: 08/15/2020 [facility]) - A1c up to 9.7, Levemir was increased to 38 units bid and all other medications continued.  Lipids also increased and patient was to either take Crestor if non-adherence was an issue or increase to 40mg  once daily.  Objective:   Goals Addressed            This Visit's Progress   . Pharmacy Care Plan:       CARE PLAN ENTRY (see longitudinal plan of care for additional care plan information)  Current Barriers:  . Chronic Disease Management support, education, and care coordination needs related to Hypertension, Hyperlipidemia, and Diabetes   Hypertension BP Readings from Last 3 Encounters:  08/15/20 122/60  03/20/20 132/66  10/18/19 128/72   . Pharmacist Clinical Goal(s): o Over the next 60 days, patient will work with PharmD and providers to maintain BP goal <130/80 . Current regimen:  o Amlodipine 10mg  . Interventions: o Reviewed office BP o Discussed importance of increased physical activity . Patient self care activities - Over the next 90 days, patient will: o Check BP periodically, document, and provide at future appointments o Ensure daily salt intake < 2300 mg/day  Hyperlipidemia Lab Results  Component Value Date/Time   LDLCALC 123 (  H) 08/15/2020 08:34 AM   . Pharmacist Clinical Goal(s): o Over the next 60 days, patient will work with PharmD and providers to achieve LDL goal < 70 . Current regimen:  o Rosuvastatin 40mg  . Interventions: o Discussed importance of statin medications o Reviewed most recent lipid panel o Encouraged continued exercise and diet modifications o Determined patient was supposed to be on 40mg  dose called in by Dr. Jeanice Lim . Patient  self care activities - Over the next 60 days, patient will: o Continue to takes medication as directed o Work to focus on diet low in fats and sugars  Diabetes Lab Results  Component Value Date/Time   HGBA1C 9.7 (H) 08/15/2020 08:34 AM   HGBA1C 9.4 (H) 03/20/2020 09:32 AM   HGBA1C 10.2 (H) 04/10/2014 09:48 AM   HGBA1C 9.8 (H) 12/27/2013 08:45 AM   . Pharmacist Clinical Goal(s): o Over the next 90 days, patient will work with PharmD and providers to achieve A1c goal <8% . Current regimen:  . Levemir 100u/mL 38 units twice daily . Humalog 100u/mL . Jardiance 10mg  . Interventions: o Evaluated cost burden of current medication regimen o Discussed diet modifications specific to diabetes o Counseled on goals for fasting and post-prandial blood sugar o Recommended strict monitoring/recording over next month . Patient self care activities - Over the next 60 days, patient will: o Check blood sugar 3 times per day, document, and provide at future appointments o Contact provider with any episodes of hypoglycemia o Continue to take medications as directed and focus on adherence   Please see past updates related to this goal by clicking on the "Past Updates" button in the selected goal         There are no care plans to display for this patient.  Diabetes   A1c goal <8%  Recent Relevant Labs: Lab Results  Component Value Date/Time   HGBA1C 9.7 (H) 08/15/2020 08:34 AM   HGBA1C 9.4 (H) 03/20/2020 09:32 AM   HGBA1C 10.2 (H) 04/10/2014 09:48 AM   HGBA1C 9.8 (H) 12/27/2013 08:45 AM   MICROALBUR 9.4 08/15/2020 08:34 AM   MICROALBUR 10.6 09/12/2019 03:19 PM    Last diabetic Eye exam:  Lab Results  Component Value Date/Time   HMDIABEYEEXA No Retinopathy 06/24/2018 12:00 AM    Last diabetic Foot exam: No results found for: HMDIABFOOTEX   Checking BG: 3x per Day  Recent FBG Readings: 137 this morning, usually 170s in the morning Recent pre-meal BG readings:  Recent 2hr PP BG  readings:   Recent HS BG readings: 170s normally hs  Patient has failed these meds in past: none noted Patient is currently uncontrolled on the following medications: . Humalog 12 units with each meal . Levemir 38 units twice daily . Jardiance 10mg  daily  We discussed:   Patient has increased her Levemir to the desired dose change 38 units twice daily  She reports adherence to all medications and doses of insulin  She reports the 137 this morning was unusual and her fasting readings are normally 170s   Diet recall: B - egg, sausage L - leftovers, pork chops, greens, potatoes D - baked meat, vegetables Snack - fruit cup before bed Liquids - water mostly  Counseled patient on the sugar content of most store bought fruit cups due to the syrup in them.  Encouraged her to read the label on her fruit cups and substitute for sugar free version. I have also asked her to record her sugars religiously in the morning  fasting and before bedtime for the next few weeks.  If patients readings continue to be high she may benefit from increase in Jardiance to 25mg .  She denies any hypoglycemia and based off most recent A1c I do no suspect she is close to experiencing any.  Plan  Continue current medications  Record sugars twice daily If still elevated at planned f/u consider increase of Jardiance to 25mg   Hypertension   BP goal is:  <130/80  Office blood pressures are  BP Readings from Last 3 Encounters:  08/15/20 122/60  03/20/20 132/66  10/18/19 128/72   Patient checks BP at home infrequently Patient home BP readings are ranging: no logs  Patient has failed these meds in the past: lisinopril Patient is currently controlled on the following medications:  . Amlodipine 10mg   We discussed   Reports adherence with medication  Denies swelling, dizziness, headaches  Most recent office BP well controlled  Plan  Continue current medications     Hyperlipidemia   LDL goal <  100  Last lipids Lab Results  Component Value Date   CHOL 204 (H) 08/15/2020   HDL 60 08/15/2020   LDLCALC 123 (H) 08/15/2020   TRIG 99 08/15/2020   CHOLHDL 3.4 08/15/2020   Hepatic Function Latest Ref Rng & Units 08/15/2020 03/20/2020 09/12/2019  Total Protein 6.1 - 8.1 g/dL 7.5 7.4 1.6(X)  Albumin 3.6 - 5.1 g/dL - - -  AST 10 - 35 U/L 17 15 18   ALT 6 - 29 U/L 15 13 18   Alk Phosphatase 33 - 130 U/L - - -  Total Bilirubin 0.2 - 1.2 mg/dL 0.3 0.4 0.3     The 09-UEAV ASCVD risk score Denman George DC Jr., et al., 2013) is: 27.7%   Values used to calculate the score:     Age: 19 years     Sex: Female     Is Non-Hispanic African American: Yes     Diabetic: Yes     Tobacco smoker: No     Systolic Blood Pressure: 122 mmHg     Is BP treated: Yes     HDL Cholesterol: 60 mg/dL     Total Cholesterol: 204 mg/dL   Patient has failed these meds in past: none noted Patient is currently uncontrolled on the following medications:  . Rosuvastatin 20mg   We discussed:    After discussion with the patient she reports that she was indeed taking her 20mg  rosuvastatin at the time of her last lab draw.  Dr Jeanice Lim called in 40mg  to her pharmacy, however patient never picked this up and continued on the 20mg .  I have instructed her to take two of the 20mg  tablets until she runs out and then can pick up the 40mg  at her pharmacy and begin taking.  We discussed her most recent lipid panel and importance of getting LDL to goal.  She verbalized understanding.  Plan  Begin taking two 20mg  tablets for total dose of Crestor 40mg  daily.  Pick up new 40mg  rx from pharmacy when current runs out.    Willa Frater, PharmD Clinical Pharmacist [facility] [phone]

## 2020-09-18 ENCOUNTER — Ambulatory Visit: Payer: Medicare Other | Admitting: Pharmacist

## 2020-09-18 DIAGNOSIS — I10 Essential (primary) hypertension: Secondary | ICD-10-CM

## 2020-09-18 DIAGNOSIS — E782 Mixed hyperlipidemia: Secondary | ICD-10-CM

## 2020-09-18 DIAGNOSIS — Z794 Long term (current) use of insulin: Secondary | ICD-10-CM

## 2020-09-18 DIAGNOSIS — E1143 Type 2 diabetes mellitus with diabetic autonomic (poly)neuropathy: Secondary | ICD-10-CM

## 2020-09-18 NOTE — Patient Instructions (Addendum)
Visit Information  Goals Addressed            This Visit's Progress   . Pharmacy Care Plan:       CARE PLAN ENTRY (see longitudinal plan of care for additional care plan information)  Current Barriers:  . Chronic Disease Management support, education, and care coordination needs related to Hypertension, Hyperlipidemia, and Diabetes   Hypertension BP Readings from Last 3 Encounters:  08/15/20 122/60  03/20/20 132/66  10/18/19 128/72   . Pharmacist Clinical Goal(s): o Over the next 60 days, patient will work with PharmD and providers to maintain BP goal <130/80 . Current regimen:  o Amlodipine 10mg  . Interventions: o Reviewed office BP o Discussed importance of increased physical activity . Patient self care activities - Over the next 90 days, patient will: o Check BP periodically, document, and provide at future appointments o Ensure daily salt intake < 2300 mg/day  Hyperlipidemia Lab Results  Component Value Date/Time   LDLCALC 123 (H) 08/15/2020 08:34 AM   . Pharmacist Clinical Goal(s): o Over the next 60 days, patient will work with PharmD and providers to achieve LDL goal < 70 . Current regimen:  o Rosuvastatin 40mg  . Interventions: o Discussed importance of statin medications o Reviewed most recent lipid panel o Encouraged continued exercise and diet modifications o Determined patient was supposed to be on 40mg  dose called in by Dr. Buelah Manis . Patient self care activities - Over the next 60 days, patient will: o Continue to takes medication as directed o Work to focus on diet low in fats and sugars  Diabetes Lab Results  Component Value Date/Time   HGBA1C 9.7 (H) 08/15/2020 08:34 AM   HGBA1C 9.4 (H) 03/20/2020 09:32 AM   HGBA1C 10.2 (H) 04/10/2014 09:48 AM   HGBA1C 9.8 (H) 12/27/2013 08:45 AM   . Pharmacist Clinical Goal(s): o Over the next 90 days, patient will work with PharmD and providers to achieve A1c goal <8% . Current regimen:  . Levemir 100u/mL  38 units twice daily . Humalog 100u/mL . Jardiance 10mg  . Interventions: o Evaluated cost burden of current medication regimen o Discussed diet modifications specific to diabetes o Counseled on goals for fasting and post-prandial blood sugar o Recommended strict monitoring/recording over next month . Patient self care activities - Over the next 60 days, patient will: o Check blood sugar 3 times per day, document, and provide at future appointments o Contact provider with any episodes of hypoglycemia o Continue to take medications as directed and focus on adherence   Please see past updates related to this goal by clicking on the "Past Updates" button in the selected goal         The patient verbalized understanding of instructions, educational materials, and care plan provided today and agreed to receive a mailed copy of patient instructions, educational materials, and care plan.   Telephone follow up appointment with pharmacy team member scheduled for: 63 days  Edy[facility], [facility]  Daily Diabetes Mellitus Record  Check your blood glucose (BG) as told by your health care provider. Use this form to record your BG results as well as any diabetes medicines that you take, including insulin. A record of your BG results and a list of your current medicines are very helpful in managing your diabetes. Your BG numbers help your health care provider know whether your diabetes management plan needs to be changed. Patient name: ____________________________________ Week of ____________________ Daily BG results and diabetes medicines Date: _________  Breakfast - BG / Medicines: ________________ / __________________________________________________________  Lunch - BG / Medicines: ___________________ / __________________________________________________________  Dinner - BG / Medicines: __________________ / __________________________________________________________  Bedtime - BG /  Medicines: ________________ / ___________________________________________________________ Date: _________  Breakfast - BG / Medicines: ________________ / __________________________________________________________  Lunch - BG / Medicines: ___________________ / __________________________________________________________  Dinner - BG / Medicines: __________________ / __________________________________________________________  Bedtime - BG / Medicines: ________________ / ___________________________________________________________ Date: _________  Breakfast - BG / Medicines: ________________ / __________________________________________________________  Lunch - BG / Medicines: ___________________ / __________________________________________________________  Dinner - BG / Medicines: __________________ / __________________________________________________________  Bedtime - BG / Medicines: ________________ / ___________________________________________________________ Date: _________  Breakfast - BG / Medicines: ________________ / __________________________________________________________  Lunch - BG / Medicines: ___________________ / __________________________________________________________  Dinner - BG / Medicines: __________________ / __________________________________________________________  Bedtime - BG / Medicines: ________________ / ___________________________________________________________ Date: _________  Breakfast - BG / Medicines: ________________ / __________________________________________________________  Lunch - BG / Medicines: ___________________ / __________________________________________________________  Dinner - BG / Medicines: __________________ / __________________________________________________________  Bedtime - BG / Medicines: ________________ / ___________________________________________________________ Date: _________  Breakfast - BG / Medicines:  ________________ / __________________________________________________________  Lunch - BG / Medicines: ___________________ / __________________________________________________________  Dinner - BG / Medicines: __________________ / __________________________________________________________  Bedtime - BG / Medicines: ________________ / ___________________________________________________________ Date: _________  Breakfast - BG / Medicines: ________________ / __________________________________________________________  Lunch - BG / Medicines: ___________________ / __________________________________________________________  Dinner - BG / Medicines: __________________ / __________________________________________________________  Bedtime - BG / Medicines: ________________ / ___________________________________________________________ Notes: ______________________________________________________________________________________________________________________ This information is not intended to replace advice given to you by your health care provider. Make sure you discuss any questions you have with your health care provider. Document Revised: 03/01/2020 Document Reviewed: 03/01/2020 Elsevier Patient Education  2021 [employer].  Blood Glucose Monitoring, Adult Monitoring your blood sugar (glucose) is an important part of managing your diabetes. Blood glucose monitoring involves checking your blood glucose as often as directed and keeping a log or record of your results over time. Checking your blood glucose regularly and keeping a blood glucose log can:  Help you and your health care provider adjust your diabetes management plan as needed, including your medicines or insulin.  Help you understand how food, exercise, illnesses, and medicines affect your blood glucose.  Let you know what your blood glucose is at any time. You can quickly find out if you have low blood glucose (hypoglycemia) or  high blood glucose (hyperglycemia). Your health care provider will set individualized treatment goals for you. Your goals will be based on your age, other medical conditions you have, and how you respond to diabetes treatment. Generally, the goal of treatment is to maintain the following blood glucose levels:  Before meals (preprandial): 80-130 mg/dL (4.4-7.2 mmol/L).  After meals (postprandial): below 180 mg/dL (10 mmol/L).  A1C level: less than 7%. Supplies needed:  Blood glucose meter.  Test strips for your meter. Each meter has its own strips. You must use the strips that came with your meter.  A needle to prick your finger (lancet). Do not use a lancet more than one time.  A device that holds the lancet (lancing device).  A journal or log book to write down your results. How to check your blood glucose Checking your blood glucose 1. Wash your hands for at least 20 seconds with soap and water. 2. Prick the side of your finger (not the tip) with the lancet. Do not use the same finger  consecutively. 3. Gently rub the finger until a small drop of blood appears. 4. Follow instructions that come with your meter for inserting the test strip, applying blood to the strip, and using your blood glucose meter. 5. Write down your result and any notes in your log.   Using alternative sites Some meters allow you to use areas of your body other than your finger (alternative sites) to test your blood. The most common alternative sites are the forearm, the thigh, and the palm of your hand. Alternative sites may not be as accurate as the fingers because blood flow is slower in those areas. This means that the result you get may be delayed, and it may be different from the result that you would get from your finger. Use the finger only, and do not use alternative sites, if:  You think you have hypoglycemia.  You sometimes do not know that your blood glucose is getting low (hypoglycemia  unawareness). General tips and recommendations Blood glucose log  Every time you check your blood glucose, write down your result. Also write down any notes about things that may be affecting your blood glucose, such as your diet and exercise for the day. This information can help you and your health care provider: ? Look for patterns in your blood glucose over time. ? Adjust your diabetes management plan as needed.  Check if your meter allows you to download your records to a computer or if there is an app for the meter. Most glucose meters store a record of glucose readings in the meter.   If you have type 1 diabetes:  Check your blood glucose 4 or more times a day if you are on intensive insulin therapy with multiple daily injections (MDI) or if you are using an insulin pump. Check your blood glucose: ? Before every meal and snack. ? Before bedtime.  Also check your blood glucose: ? If you have symptoms of hypoglycemia. ? After treating low blood glucose. ? Before doing activities that create a risk for injury, like driving or using machinery. ? Before and after exercise. ? Two hours after a meal. ? Occasionally between 2:00 a.m. and 3:00 a.m., as directed.  You may need to check your blood glucose more often, 6-10 times per day, if: ? You have diabetes that is not well controlled. ? You are ill. ? You have a history of severe hypoglycemia. ? You have hypoglycemia unawareness. If you have type 2 diabetes:  Check your blood glucose 2 or more times a day if you take insulin or other diabetes medicines.  Check your blood glucose 4 or more times a day if you are on intensive insulin therapy. Occasionally, you may also need to check your glucose between 2:00 a.m. and 3:00 a.m., as directed.  Also check your blood glucose: ? Before and after exercise. ? Before doing activities that create a risk for injury, like driving or using machinery.  You may need to check your blood glucose  more often if: ? Your medicine is being adjusted. ? Your diabetes is not well controlled. ? You are ill. General tips  Make sure you always have your supplies with you.  After you use a few boxes of test strips, adjust (calibrate) your blood glucose meter by following instructions that came with your meter.  If you have questions or need help, all blood glucose meters have a 24-hour hotline phone number available that you can call. Also contact your health care provider  with questions or concerns you may have. Where to find more information  The American Diabetes Association: www.diabetes.org  The Association of Diabetes Care & Education Specialists: www.diabeteseducator.org Contact a health care provider if:  Your blood glucose is at or above 240 mg/dL (13.3 mmol/L) for 2 days in a row.  You have been sick or have had a fever for 2 days or longer, and you are not getting better.  You have any of the following problems for more than 6 hours: ? You cannot eat or drink. ? You have nausea or vomiting. ? You have diarrhea. Get help right away if:  Your blood glucose is lower than 54 mg/dL (3 mmol/L).  You become confused, or you have trouble thinking clearly.  You have difficulty breathing.  You have moderate or large ketone levels in your urine. These symptoms may represent a serious problem that is an emergency. Do not wait to see if the symptoms will go away. Get medical help right away. Call your local emergency services (911 in the [place] Do not drive yourself to the hospital. Summary  Monitoring your blood glucose is an important part of managing your diabetes.  Blood glucose monitoring involves checking your blood glucose as often as directed and keeping a log or record of your results over time.  Your health care provider will set individualized treatment goals for you. Your goals will be based on your age, other medical conditions you have, and how you respond to  diabetes treatment.  Every time you check your blood glucose, write down your result. Also, write down any notes about things that may be affecting your blood glucose, such as your diet and exercise for the day. This information is not intended to replace advice given to you by your health care provider. Make sure you discuss any questions you have with your health care provider. Document Revised: 05/23/2020 Document Reviewed: 05/23/2020 Elsevier Patient Education  2021 [employer].

## 2020-09-22 ENCOUNTER — Other Ambulatory Visit: Payer: Self-pay | Admitting: Family Medicine

## 2020-09-22 DIAGNOSIS — Z794 Long term (current) use of insulin: Secondary | ICD-10-CM

## 2020-09-22 DIAGNOSIS — E1143 Type 2 diabetes mellitus with diabetic autonomic (poly)neuropathy: Secondary | ICD-10-CM

## 2020-09-28 ENCOUNTER — Telehealth: Payer: Self-pay | Admitting: Pharmacist

## 2020-09-28 NOTE — Progress Notes (Signed)
° ° °  Chronic Care Management Pharmacy Assistant   Name: Sue Wood  MRN: [id number] DOB: 25-Sep-1945  Reason for Encounter: Adherence Review  Patient Questions:  1.  Have you seen any other providers since your last visit? No  2.  Any changes in your medicines or health? No    PCP : Alycia Rossetti, MD   Verified Adherence Gap Information. Per insurance data, the patient is 90-99% compliant with the following medication: Rosuvastatin 20 mg. Patient has not met goal for annual wellness and wellness bundle screening. Patients last A1C was 9.4 on 03/21/20.  Follow-Up:  Pharmacist Review

## 2020-10-16 ENCOUNTER — Telehealth: Payer: Self-pay | Admitting: Pharmacist

## 2020-10-16 NOTE — Progress Notes (Addendum)
Chronic Care Management Pharmacy Assistant   Name: Sue Wood  MRN: [id number] DOB: 1945/10/27  Reason for Encounter: Disease State For DM.  Patient Questions:  1.  Have you seen any other providers since your last visit? No.   2.  Any changes in your medicines or health? No.    PCP : Alycia Rossetti, MD   Their chronic conditions include: HTN, Diabetes, HLD, osteoporosis, osteoarthritis.  Office Visits: None since 09/28/20  Consults: None since 09/28/20  Allergies:   Allergies  Allergen Reactions   Lisinopril Swelling    Lip swelling     Medications: Outpatient Encounter Medications as of 10/16/2020  Medication Sig   Alcohol Swabs 70 % PADS Use 1-4 times daily prn   amitriptyline (ELAVIL) 50 MG tablet TAKE 1 TABLET BY MOUTH AT  BEDTIME   amLODipine (NORVASC) 10 MG tablet TAKE 1 TABLET BY MOUTH  DAILY   aspirin 81 MG tablet Take 1 tablet (81 mg total) by mouth at bedtime.   Blood Glucose Monitoring Suppl (BLOOD GLUCOSE SYSTEM PAK) KIT Please dispense based on patient and insurance preference. Use as directed to monitor FSBS 3x daily. Dx: E11.9.   empagliflozin (JARDIANCE) 10 MG TABS tablet Take 1 tablet (10 mg total) by mouth daily before breakfast.   HUMALOG KWIKPEN 100 UNIT/ML KwikPen INJECT  12 UNITS SUBCUTANEOUSLY WITH EACH MEAL   Lancets MISC Please dispense based on patient and insurance preference. Use as directed to monitor FSBS 3x daily. Dx: E11.9.   LEVEMIR FLEXTOUCH 100 UNIT/ML FlexPen INJECT 36 UNITS SUBCUTANEOUSLY TWICE DAILY   ONETOUCH ULTRA test strip USE TO MONITOR FASTING  BLOOD SUGAR 3 TIMES DAILY  AS DIRECTED   rosuvastatin (CRESTOR) 20 MG tablet TAKE 1 TABLET BY MOUTH  DAILY   rosuvastatin (CRESTOR) 40 MG tablet Take 1 tablet (40 mg total) by mouth daily.   triamcinolone cream (KENALOG) 0.1 % Apply 1 application topically 2 (two) times daily.   No facility-administered encounter medications on file as of 10/16/2020.    Current  Diagnosis: Patient Active Problem List   Diagnosis Date Noted   Osteoporosis 06/30/2017   Osteoarthritis, knee 12/16/2012   Diabetes mellitus with neurological manifestation ([place]) 12/31/2011   Essential hypertension, benign 12/31/2011   Hyperlipidemia 12/31/2011   Bilateral bunions 12/31/2011   Diabetic neuropathy ([place]) 12/31/2011   Obesity 12/31/2011    Goals Addressed   None    Recent Relevant Labs: Lab Results  Component Value Date/Time   HGBA1C 9.7 (H) 08/15/2020 08:34 AM   HGBA1C 9.4 (H) 03/20/2020 09:32 AM   HGBA1C 10.2 (H) 04/10/2014 09:48 AM   HGBA1C 9.8 (H) 12/27/2013 08:45 AM   MICROALBUR 9.4 08/15/2020 08:34 AM   MICROALBUR 10.6 09/12/2019 03:19 PM    Kidney Function Lab Results  Component Value Date/Time   CREATININE 0.85 08/15/2020 08:34 AM   CREATININE 0.86 03/20/2020 09:32 AM   GFRNONAA 63 04/10/2014 09:48 AM   GFRAA 73 04/10/2014 09:48 AM    Current antihyperglycemic regimen:  Levemir 100u/mL 38 units twice daily Humalog 100u/mL 12 units three times daily (before each meal) Jardiance 10 mg  What recent interventions/DTPs have been made to improve glycemic control:  None.  Have there been any recent hospitalizations or ED visits since last visit with CPP? Patient stated no.  Patient reports hypoglycemic symptoms, including None   Patient reports hyperglycemic symptoms, including none   How often are you checking your blood sugar? 3-4 times daily   What are your  blood sugars ranging?  10/11/20 199 AM 99 Lunch 127 Dinner 10/12/20 139 AM 109 Lunch 188 Dinner 10/13/20 134 AM 119 Lunch 87 Dinner 10/14/20 138 AM  130 Lunch 142 Dinner 10/15/20 143 AM 111 Lunch 111 Dinner  Fasting: N/A Before meals: N/A After meals: N/A Bedtime: N/A  During the week, how often does your blood glucose drop below 70? Patient stated Never.  Are you checking your feet daily/regularly?  Patient stated her feet swell and cause pain so she checks her feet each day. Patient  stated her feet has been swelling on and off for awhile not sure when it started.   Adherence Review: Is the patient currently on a STATIN medication? Yes, Rosuvastatin 20 and 40 mg.   Is the patient currently on ACE/ARB medication? No.  Does the patient have >5 day gap between last estimated fill dates? No  Patient stated she does not have any concerns about her medications at this time.    Follow-Up:  Pharmacist Review   Charlann Lange, RMA Clinical Pharmacist Assistant [phone]  5 minutes spent in review, coordination, and documentation.  Reviewed by: Beverly Milch, PharmD Clinical Pharmacist [facility] [phone]

## 2020-10-17 ENCOUNTER — Other Ambulatory Visit: Payer: Self-pay | Admitting: Family Medicine

## 2020-10-17 DIAGNOSIS — Z794 Long term (current) use of insulin: Secondary | ICD-10-CM

## 2020-11-08 ENCOUNTER — Other Ambulatory Visit: Payer: Self-pay | Admitting: Family Medicine

## 2020-11-08 DIAGNOSIS — E1143 Type 2 diabetes mellitus with diabetic autonomic (poly)neuropathy: Secondary | ICD-10-CM

## 2020-11-11 NOTE — Progress Notes (Signed)
Subjective:    Patient ID: Sue Wood, female    DOB: 28-Jan-1946, 75 y.o.   MRN: [id number]  HPI: Sue Wood is a 75 y.o. female presenting for diabetes follow up.  Chief Complaint  Patient presents with  . Diabetes   Patient reports her daughter lives with her right now.  Other daughter lives in [place].  States she has a good support system.  DIABETES Hypoglycemic episodes:no Polydipsia/polyuria: no Visual disturbance: no Chest pain: no Paresthesias: yes; in toes Glucose Monitoring: yes  Accucheck frequency:  Fasting glucose: 150s  Lunchtime: 100s-110s  Evening: 150s-180s Taking Insulin?: yes  Long acting insulin: 38 units twice daily  Short acting insulin: 12 units with meals Breakfast: egg, sausage, toast, coffee with NSA creamer Lunch: sandwich water Supper: baked chicken, string beans, potatoes Blood Pressure Monitoring: not checking Retinal Examination: Up to Date Foot Exam: Up to Date per patient; will request records from [facility] in [place] Diabetic Education: Not Completed Pneumovax: Up to Date Influenza: Up to Date Aspirin: yes  HYPERLIPIDEMIA Hyperlipidemia status: excellent compliance Satisfied with current treatment?  yes Side effects:  no Medication compliance: excellent compliance Past cholesterol meds: rosuvastatin Aspirin:  yes The 10-year ASCVD risk score Mikey Bussing DC Jr., et al., 2013) is: 31.2%   Values used to calculate the score:     Age: 84 years     Sex: Female     Is Non-Hispanic African American: Yes     Diabetic: Yes     Tobacco smoker: No     Systolic Blood Pressure: 361 mmHg     Is BP treated: Yes     HDL Cholesterol: 60 mg/dL     Total Cholesterol: 204 mg/dL Chest pain:  no Coronary artery disease:  no Family history CAD:  no Family history early CAD:  no  Allergies  Allergen Reactions  . Lisinopril Swelling    Lip swelling     Outpatient Encounter Medications as of 11/12/2020  Medication Sig   . Alcohol Swabs 70 % PADS Use 1-4 times daily prn  . amitriptyline (ELAVIL) 50 MG tablet TAKE 1 TABLET BY MOUTH AT  BEDTIME  . amLODipine (NORVASC) 10 MG tablet TAKE 1 TABLET BY MOUTH  DAILY  . aspirin 81 MG tablet Take 1 tablet (81 mg total) by mouth at bedtime.  . Blood Glucose Monitoring Suppl (BLOOD GLUCOSE SYSTEM PAK) KIT Please dispense based on patient and insurance preference. Use as directed to monitor FSBS 3x daily. Dx: E11.9.  Marland Kitchen empagliflozin (JARDIANCE) 10 MG TABS tablet Take 1 tablet (10 mg total) by mouth daily before breakfast.  . HUMALOG KWIKPEN 100 UNIT/ML KwikPen INJECT  12 UNITS SUBCUTANEOUSLY WITH EACH MEAL  . Lancets MISC Please dispense based on patient and insurance preference. Use as directed to monitor FSBS 3x daily. Dx: E11.9.  . LEVEMIR FLEXTOUCH 100 UNIT/ML FlexPen INJECT 36 UNITS SUBCUTANEOUSLY TWICE DAILY  . ONETOUCH ULTRA test strip USE TO MONITOR FASTING  BLOOD SUGAR 3 TIMES DAILY  AS DIRECTED  . rosuvastatin (CRESTOR) 40 MG tablet Take 1 tablet (40 mg total) by mouth daily.  Marland Kitchen triamcinolone cream (KENALOG) 0.1 % Apply 1 application topically 2 (two) times daily.  . [DISCONTINUED] rosuvastatin (CRESTOR) 20 MG tablet TAKE 1 TABLET BY MOUTH  DAILY   No facility-administered encounter medications on file as of 11/12/2020.    Patient Active Problem List   Diagnosis Date Noted  . Osteoporosis 06/30/2017  . Osteoarthritis, knee 12/16/2012  . Diabetes mellitus  with neurological manifestation ([facility]) 12/31/2011  . Essential hypertension, benign 12/31/2011  . Hyperlipidemia 12/31/2011  . Bilateral bunions 12/31/2011  . Diabetic neuropathy ([place]) 12/31/2011  . Obesity 12/31/2011    Past Medical History:  Diagnosis Date  . Arthritis   . Diabetes mellitus   . Hyperlipidemia   . Hypertension   . Peripheral neuropathy     Relevant past medical, surgical, family and social history reviewed and updated as indicated. Interim medical history since our last visit  reviewed.  Review of Systems Per HPI unless specifically indicated above     Objective:    BP 132/76 (BP Location: Left Arm, Patient Position: Sitting, Cuff Size: Large)   Pulse 95   Temp 98.7 F (37.1 C) (Oral)   Ht _0  (1.702 m)   Wt 218 lb 12.8 oz (99.2 kg)   SpO2 97%   BMI 34.27 kg/m   Wt Readings from Last 3 Encounters:  11/12/20 218 lb 12.8 oz (99.2 kg)  08/15/20 215 lb (97.5 kg)  03/20/20 226 lb (102.5 kg)    Physical Exam Vitals and nursing note reviewed.  Constitutional:      General: She is not in acute distress.    Appearance: Normal appearance. She is not toxic-appearing.  HENT:     Head: Normocephalic and atraumatic.  Eyes:     General: No scleral icterus.       Right eye: No discharge.        Left eye: No discharge.     Extraocular Movements: Extraocular movements intact.  Neck:     Vascular: No carotid bruit.  Cardiovascular:     Rate and Rhythm: Normal rate and regular rhythm.     Heart sounds: Normal heart sounds. No murmur heard.   Pulmonary:     Effort: Pulmonary effort is normal. No respiratory distress.     Breath sounds: Normal breath sounds. No wheezing, rhonchi or rales.  Abdominal:     General: Abdomen is flat. Bowel sounds are normal. There is no distension.     Palpations: Abdomen is soft. There is no mass.  Musculoskeletal:     Cervical back: Normal range of motion.  Skin:    General: Skin is warm and dry.     Capillary Refill: Capillary refill takes more than 3 seconds.     Coloration: Skin is not jaundiced or pale.     Findings: No erythema.  Neurological:     General: No focal deficit present.     Mental Status: She is alert and oriented to person, place, and time.     Motor: No weakness.     Gait: Gait normal.  Psychiatric:        Mood and Affect: Mood normal.        Behavior: Behavior normal.        Thought Content: Thought content normal.        Judgment: Judgment normal.     Results for orders placed or performed  in visit on 08/15/20  CBC with Differential/Platelet  Result Value Ref Range   WBC 5.2 3.8 - 10.8 Thousand/uL   RBC 4.49 3.80 - 5.10 Million/uL   Hemoglobin 13.1 11.7 - 15.5 g/dL   HCT 38.6 35.0 - 45.0 %   MCV 86.0 80.0 - 100.0 fL   MCH 29.2 27.0 - 33.0 pg   MCHC 33.9 32.0 - 36.0 g/dL   RDW 14.4 11.0 - 15.0 %   Platelets 247 140 - 400 Thousand/uL   MPV  11.4 7.5 - 12.5 fL   Neutro Abs 3,193 1,500 - 7,800 cells/uL   Lymphs Abs 1,617 850 - 3,900 cells/uL   Absolute Monocytes 229 200 - 950 cells/uL   Eosinophils Absolute 120 15 - 500 cells/uL   Basophils Absolute 42 0 - 200 cells/uL   Neutrophils Relative % 61.4 %   Total Lymphocyte 31.1 %   Monocytes Relative 4.4 %   Eosinophils Relative 2.3 %   Basophils Relative 0.8 %  Comprehensive metabolic panel  Result Value Ref Range   Glucose, Bld 183 (H) 65 - 99 mg/dL   BUN 15 7 - 25 mg/dL   Creat 0.85 0.60 - 0.93 mg/dL   BUN/Creatinine Ratio NOT APPLICABLE 6 - 22 (calc)   Sodium 139 135 - 146 mmol/L   Potassium 4.1 3.5 - 5.3 mmol/L   Chloride 104 98 - 110 mmol/L   CO2 25 20 - 32 mmol/L   Calcium 9.5 8.6 - 10.4 mg/dL   Total Protein 7.5 6.1 - 8.1 g/dL   Albumin 4.2 3.6 - 5.1 g/dL   Globulin 3.3 1.9 - 3.7 g/dL (calc)   AG Ratio 1.3 1.0 - 2.5 (calc)   Total Bilirubin 0.3 0.2 - 1.2 mg/dL   Alkaline phosphatase (APISO) 71 37 - 153 U/L   AST 17 10 - 35 U/L   ALT 15 6 - 29 U/L  Hemoglobin A1c  Result Value Ref Range   Hgb A1c MFr Bld 9.7 (H) <5.7 % of total Hgb   Mean Plasma Glucose 232 mg/dL   eAG (mmol/L) 12.8 mmol/L  Lipid panel  Result Value Ref Range   Cholesterol 204 (H) <200 mg/dL   HDL 60 > OR = 50 mg/dL   Triglycerides 99 <150 mg/dL   LDL Cholesterol (Calc) 123 (H) mg/dL (calc)   Total CHOL/HDL Ratio 3.4 <5.0 (calc)   Non-HDL Cholesterol (Calc) 144 (H) <130 mg/dL (calc)  Microalbumin / creatinine urine ratio  Result Value Ref Range   Creatinine, Urine 77 20 - 275 mg/dL   Microalb, Ur 9.4 mg/dL   Microalb Creat Ratio  122 (H) <30 mcg/mg creat      Assessment & Plan:   Problem List Items Addressed This Visit      Endocrine   Diabetes mellitus with neurological manifestation (HCC) - Primary    Chronic.  Last A1c in December 2021 was 9.7%.  Goal is less than 8%.  Reports taking both her Jardiance, Levemir, and Humalog consistently.  Blood glucose meter today shows glucose on average about 150.  A1c checked today along with BMET.  Reports obtained eye exam last month-we will request records.  Up-to-date on vaccines.  Foot exam up-to-date and urine microalbumin up-to-date.  We will plan to follow-up in 3 months.      Relevant Orders   Hemoglobin Z0C   BASIC METABOLIC PANEL WITH GFR   Diabetic neuropathy (HCC)    Chronic.  Takes amitriptyline at nighttime to help with this and thinks it is helping well.  Discussed also using gabapentin in future if indicated or if neuropathy worsens.      Relevant Orders   Hemoglobin H8N   BASIC METABOLIC PANEL WITH GFR     Other   Hyperlipidemia    Chronic.  LDL goal less than less than 70 given diabetes.  Recently increased Crestor to 40 mg-we will check lipids today.  Encouraged continued heart healthy diet.  Follow-up in 3 months.      Relevant Orders   Lipid  Panel   BASIC METABOLIC PANEL WITH GFR       Follow up plan: Return in about 3 months (around 02/12/2021) for diabetes follow up.

## 2020-11-12 ENCOUNTER — Other Ambulatory Visit: Payer: Self-pay

## 2020-11-12 ENCOUNTER — Encounter: Payer: Self-pay | Admitting: Nurse Practitioner

## 2020-11-12 ENCOUNTER — Ambulatory Visit (INDEPENDENT_AMBULATORY_CARE_PROVIDER_SITE_OTHER): Payer: Medicare Other | Admitting: Nurse Practitioner

## 2020-11-12 VITALS — BP 132/76 | HR 95 | Temp 98.7°F | Ht 67.0 in | Wt 218.8 lb

## 2020-11-12 DIAGNOSIS — Z794 Long term (current) use of insulin: Secondary | ICD-10-CM | POA: Diagnosis not present

## 2020-11-12 DIAGNOSIS — I10 Essential (primary) hypertension: Secondary | ICD-10-CM | POA: Diagnosis not present

## 2020-11-12 DIAGNOSIS — E1141 Type 2 diabetes mellitus with diabetic mononeuropathy: Secondary | ICD-10-CM

## 2020-11-12 DIAGNOSIS — E1143 Type 2 diabetes mellitus with diabetic autonomic (poly)neuropathy: Secondary | ICD-10-CM | POA: Diagnosis not present

## 2020-11-12 DIAGNOSIS — E782 Mixed hyperlipidemia: Secondary | ICD-10-CM | POA: Diagnosis not present

## 2020-11-12 NOTE — Assessment & Plan Note (Signed)
Chronic.  LDL goal less than less than 70 given diabetes.  Recently increased Crestor to 40 mg-we will check lipids today.  Encouraged continued heart healthy diet.  Follow-up in 3 months.

## 2020-11-12 NOTE — Assessment & Plan Note (Signed)
Chronic.  Takes amitriptyline at nighttime to help with this and thinks it is helping well.  Discussed also using gabapentin in future if indicated or if neuropathy worsens.

## 2020-11-12 NOTE — Assessment & Plan Note (Signed)
Chronic.  Last A1c in December 2021 was 9.7%.  Goal is less than 8%.  Reports taking both her Jardiance, Levemir, and Humalog consistently.  Blood glucose meter today shows glucose on average about 150.  A1c checked today along with BMET.  Reports obtained eye exam last month-we will request records.  Up-to-date on vaccines.  Foot exam up-to-date and urine microalbumin up-to-date.  We will plan to follow-up in 3 months.

## 2020-11-12 NOTE — Patient Instructions (Addendum)
F/u in 3 months for diabetes    https://www.diabeteseducator.org/docs/default-source/living-with-diabetes/conquering-the-grocery-store-v1.pdf?sfvrsn=4">  Carbohydrate Counting for Diabetes Mellitus, Adult Carbohydrate counting is a method of keeping track of how many carbohydrates you eat. Eating carbohydrates naturally increases the amount of sugar (glucose) in the blood. Counting how many carbohydrates you eat improves your blood glucose control, which helps you manage your diabetes. It is important to know how many carbohydrates you can safely have in each meal. This is different for every person. A dietitian can help you make a meal plan and calculate how many carbohydrates you should have at each meal and snack. What foods contain carbohydrates? Carbohydrates are found in the following foods:  Grains, such as breads and cereals.  Dried beans and soy products.  Starchy vegetables, such as potatoes, peas, and corn.  Fruit and fruit juices.  Milk and yogurt.  Sweets and snack foods, such as cake, cookies, candy, chips, and soft drinks.   How do I count carbohydrates in foods? There are two ways to count carbohydrates in food. You can read food labels or learn standard serving sizes of foods. You can use either of the methods or a combination of both. Using the Nutrition Facts label The Nutrition Facts list is included on the labels of almost all packaged foods and beverages in the [place] It includes:  The serving size.  Information about nutrients in each serving, including the grams (g) of carbohydrate per serving. To use the Nutrition Facts:  Decide how many servings you will have.  Multiply the number of servings by the number of carbohydrates per serving.  The resulting number is the total amount of carbohydrates that you will be having. Learning the standard serving sizes of foods When you eat carbohydrate foods that are not packaged or do not include Nutrition Facts on the  label, you need to measure the servings in order to count the amount of carbohydrates.  Measure the foods that you will eat with a food scale or measuring cup, if needed.  Decide how many standard-size servings you will eat.  Multiply the number of servings by 15. For foods that contain carbohydrates, one serving equals 15 g of carbohydrates. ? For example, if you eat 2 cups or 10 oz (300 g) of strawberries, you will have eaten 2 servings and 30 g of carbohydrates (2 servings x 15 g = 30 g).  For foods that have more than one food mixed, such as soups and casseroles, you must count the carbohydrates in each food that is included. The following list contains standard serving sizes of common carbohydrate-rich foods. Each of these servings has about 15 g of carbohydrates:  1 slice of bread.  1 six-inch (15 cm) tortilla.  ? cup or 2 oz (53 g) cooked rice or pasta.   cup or 3 oz (85 g) cooked or canned, drained and rinsed beans or lentils.   cup or 3 oz (85 g) starchy vegetable, such as peas, corn, or squash.   cup or 4 oz (120 g) hot cereal.   cup or 3 oz (85 g) boiled or mashed potatoes, or  or 3 oz (85 g) of a large baked potato.   cup or 4 fl oz (118 mL) fruit juice.  1 cup or 8 fl oz (237 mL) milk.  1 small or 4 oz (106 g) apple.   or 2 oz (63 g) of a medium banana.  1 cup or 5 oz (150 g) strawberries.  3 cups or 1 oz (  24 g) popped popcorn. What is an example of carbohydrate counting? To calculate the number of carbohydrates in this sample meal, follow the steps shown below. Sample meal  3 oz (85 g) chicken breast.  ? cup or 4 oz (106 g) brown rice.   cup or 3 oz (85 g) corn.  1 cup or 8 fl oz (237 mL) milk.  1 cup or 5 oz (150 g) strawberries with sugar-free whipped topping. Carbohydrate calculation 1. Identify the foods that contain carbohydrates: ? Rice. ? Corn. ? Milk. ? Strawberries. 2. Calculate how many servings you have of each food: ? 2  servings rice. ? 1 serving corn. ? 1 serving milk. ? 1 serving strawberries. 3. Multiply each number of servings by 15 g: ? 2 servings rice x 15 g = 30 g. ? 1 serving corn x 15 g = 15 g. ? 1 serving milk x 15 g = 15 g. ? 1 serving strawberries x 15 g = 15 g. 4. Add together all of the amounts to find the total grams of carbohydrates eaten: ? 30 g + 15 g + 15 g + 15 g = 75 g of carbohydrates total. What are tips for following this plan? Shopping  Develop a meal plan and then make a shopping list.  Buy fresh and frozen vegetables, fresh and frozen fruit, dairy, eggs, beans, lentils, and whole grains.  Look at food labels. Choose foods that have more fiber and less sugar.  Avoid processed foods and foods with added sugars. Meal planning  Aim to have the same amount of carbohydrates at each meal and for each snack time.  Plan to have regular, balanced meals and snacks. Where to find more information  American Diabetes Association: www.diabetes.org  Centers for Disease Control and Prevention: http://www.wolf.info/ Summary  Carbohydrate counting is a method of keeping track of how many carbohydrates you eat.  Eating carbohydrates naturally increases the amount of sugar (glucose) in the blood.  Counting how many carbohydrates you eat improves your blood glucose control, which helps you manage your diabetes.  A dietitian can help you make a meal plan and calculate how many carbohydrates you should have at each meal and snack. This information is not intended to replace advice given to you by your health care provider. Make sure you discuss any questions you have with your health care provider. Document Revised: 08/25/2019 Document Reviewed: 08/26/2019 Elsevier Patient Education  2021 [employer].

## 2020-11-13 LAB — LIPID PANEL
Cholesterol: 186 mg/dL (ref <200)
HDL: 59 mg/dL (ref >=50)
LDL Cholesterol (Calc): 112 mg/dL (calc) — ABNORMAL HIGH
Non-HDL Cholesterol (Calc): 127 mg/dL (calc) (ref <130)
Total CHOL/HDL Ratio: 3.2 (calc) (ref <5.0)
Triglycerides: 68 mg/dL (ref <150)

## 2020-11-13 LAB — BASIC METABOLIC PANEL WITH GFR
BUN: 18 mg/dL (ref 7–25)
CO2: 25 mmol/L (ref 20–32)
Calcium: 9.7 mg/dL (ref 8.6–10.4)
Chloride: 104 mmol/L (ref 98–110)
Creat: 0.86 mg/dL (ref 0.60–0.93)
GFR, Est African American: 78 mL/min/{1.73_m2} (ref >=60)
GFR, Est Non African American: 67 mL/min/{1.73_m2} (ref >=60)
Glucose, Bld: 118 mg/dL — ABNORMAL HIGH (ref 65–99)
Potassium: 4.4 mmol/L (ref 3.5–5.3)
Sodium: 139 mmol/L (ref 135–146)

## 2020-11-13 LAB — HEMOGLOBIN A1C
Hgb A1c MFr Bld: 8 % of total Hgb — ABNORMAL HIGH (ref <5.7)
Mean Plasma Glucose: 183 mg/dL
eAG (mmol/L): 10.1 mmol/L

## 2020-11-14 NOTE — Addendum Note (Signed)
Addended by: Amalia Hailey on: 11/14/2020 09:04 AM   Modules accepted: Orders

## 2020-11-14 NOTE — Progress Notes (Signed)
Chronic Care Management Pharmacy Note  11/19/2020 Name:  Sue Wood MRN:  [id number] DOB:  Apr 16, 1946  Subjective: Sue Wood is an 75 y.o. year old female who is a primary patient of Sue Bear, NP.  The CCM team was consulted for assistance with disease management and care coordination needs.    Engaged with patient by telephone for follow up visit in response to provider referral for pharmacy case management and/or care coordination services.   Consent to Services:  The patient was given the following information about Chronic Care Management services today, agreed to services, and gave verbal consent: 1. CCM service includes personalized support from designated clinical staff supervised by the primary care provider, including individualized plan of care and coordination with other care providers 2. 24/7 contact phone numbers for assistance for urgent and routine care needs. 3. Service will only be billed when office clinical staff spend 20 minutes or more in a month to coordinate care. 4. Only one practitioner may furnish and bill the service in a calendar month. 5.The patient may stop CCM services at any time (effective at the end of the month) by phone call to the office staff. 6. The patient will be responsible for cost sharing (co-pay) of up to 20% of the service fee (after annual deductible is met). Patient agreed to services and consent obtained.  Patient Care Team: Sue Bear, NP as PCP - General (Nurse Practitioner) Sue Wood, [facility] as Pharmacist (Pharmacist)  Recent office visits: 11/12/20 Sue Petrin, NP) - regular labs checked.  LDL improved but still elevated.  A1c improved to 8.0.  Recent consult visits: None since last CCM visit  Hospital visits: None in previous 6 months  Objective:  Lab Results  Component Value Date   CREATININE 0.86 11/12/2020   BUN 18 11/12/2020   GFRNONAA 67 11/12/2020   GFRAA 78 11/12/2020   NA 139  11/12/2020   K 4.4 11/12/2020   CALCIUM 9.7 11/12/2020   CO2 25 11/12/2020    Lab Results  Component Value Date/Time   HGBA1C 8.0 (H) 11/12/2020 08:25 AM   HGBA1C 9.7 (H) 08/15/2020 08:34 AM   HGBA1C 10.2 (H) 04/10/2014 09:48 AM   HGBA1C 9.8 (H) 12/27/2013 08:45 AM   MICROALBUR 9.4 08/15/2020 08:34 AM   MICROALBUR 10.6 09/12/2019 03:19 PM    Last diabetic Eye exam:  Lab Results  Component Value Date/Time   HMDIABEYEEXA No Retinopathy 06/24/2018 12:00 AM    Last diabetic Foot exam: No results found for: HMDIABFOOTEX   Lab Results  Component Value Date   CHOL 186 11/12/2020   HDL 59 11/12/2020   LDLCALC 112 (H) 11/12/2020   TRIG 68 11/12/2020   CHOLHDL 3.2 11/12/2020    Hepatic Function Latest Ref Rng & Units 08/15/2020 03/20/2020 09/12/2019  Total Protein 6.1 - 8.1 g/dL 7.5 7.4 8.2(H)  Albumin 3.6 - 5.1 g/dL - - -  AST 10 - 35 U/L $Remo'17 15 18  'LcgYS$ ALT 6 - 29 U/L $Remo'15 13 18  'EHGZO$ Alk Phosphatase 33 - 130 U/L - - -  Total Bilirubin 0.2 - 1.2 mg/dL 0.3 0.4 0.3    No results found for: TSH, FREET4  CBC Latest Ref Rng & Units 08/15/2020 09/12/2019 12/10/2018  WBC 3.8 - 10.8 Thousand/uL 5.2 5.7 4.4  Hemoglobin 11.7 - 15.5 g/dL 13.1 13.1 13.0  Hematocrit 35.0 - 45.0 % 38.6 39.8 38.3  Platelets 140 - 400 Thousand/uL 247 256 274    No results found  for: VD25OH  Clinical ASCVD: No  The 10-year ASCVD risk score Sue Wood DC Jr., et al., 2013) is: 28.9%   Values used to calculate the score:     Age: 36 years     Sex: Female     Is Non-Hispanic African American: Yes     Diabetic: Yes     Tobacco smoker: No     Systolic Blood Pressure: 174 mmHg     Is BP treated: Yes     HDL Cholesterol: 59 mg/dL     Total Cholesterol: 186 mg/dL    Depression screen [facility] 2/9 08/15/2020 10/18/2019 09/12/2019  Decreased Interest 0 0 0  Down, Depressed, Hopeless 0 0 0  PHQ - 2 Score 0 0 0  Altered sleeping - - -  Tired, decreased energy - - -  Change in appetite - - -  Feeling bad or failure about yourself  - - -   Trouble concentrating - - -  Moving slowly or fidgety/restless - - -  Suicidal thoughts - - -  PHQ-9 Score - - -  Difficult doing work/chores - - -     Social History   Tobacco Use  Smoking Status Former Smoker  . Packs/day: 0.25  . Years: 18.00  . Pack years: 4.50  . Types: Cigarettes  Smokeless Tobacco Never Used  Tobacco Comment   quit 1980's   BP Readings from Last 3 Encounters:  11/12/20 132/76  08/15/20 122/60  03/20/20 132/66   Pulse Readings from Last 3 Encounters:  11/12/20 95  08/15/20 90  03/20/20 96   Wt Readings from Last 3 Encounters:  11/12/20 218 lb 12.8 oz (99.2 kg)  08/15/20 215 lb (97.5 kg)  03/20/20 226 lb (102.5 kg)    Assessment/Interventions: Review of patient past medical history, allergies, medications, health status, including review of consultants reports, laboratory and other test data, was performed as part of comprehensive evaluation and provision of chronic care management services.   SDOH:  (Social Determinants of Health) assessments and interventions performed: Yes  Financial Resource Strain: Low Risk   . Difficulty of Paying Living Expenses: Not very hard    CCM Care Plan  Allergies  Allergen Reactions  . Lisinopril Swelling    Lip swelling     Medications Reviewed Today    Reviewed by Sue Wood, [facility] (Pharmacist) on 11/19/20 at 1113  Med List Status: <None>  Medication Order Taking? Sig Documenting Provider Last Dose Status Informant  Alcohol Swabs 70 % PADS [id number] Yes Use 1-4 times daily prn Sue Rossetti, MD Taking Active   amitriptyline (ELAVIL) 50 MG tablet [id number] Yes TAKE 1 TABLET BY MOUTH AT  BEDTIME Sue Rossetti, MD Taking Active   amLODipine (NORVASC) 10 MG tablet [id number] Yes TAKE 1 TABLET BY MOUTH  DAILY [place], Sue Nunnery, MD Taking Active   aspirin 81 MG tablet [id number] Yes Take 1 tablet (81 mg total) by mouth at bedtime. [place], Sue Nunnery, MD Taking Active   Blood Glucose Monitoring  Suppl (BLOOD GLUCOSE SYSTEM PAK) KIT [id number] Yes Please dispense based on patient and insurance preference. Use as directed to monitor FSBS 3x daily. Dx: E11.9. , Sue Nunnery, MD Taking Active   empagliflozin (JARDIANCE) 10 MG TABS tablet [id number] Yes Take 1 tablet (10 mg total) by mouth daily before breakfast. Sue Rossetti, MD Taking Active   HUMALOG [facility] 100 UNIT/ML KwikPen [id number] Yes [place], Sue Nunnery, MD Taking Active  Lancets MISC [id number] Yes Please dispense based on patient and insurance preference. Use as directed to monitor FSBS 3x daily. Dx: E11.9. Sue Rossetti, MD Taking Active   LEVEMIR [facility] 100 UNIT/ML FlexPen [id number] Yes INJECT 36 UNITS SUBCUTANEOUSLY TWICE DAILY [place], Sue Nunnery, MD Taking Active   [facility] ULTRA test strip [id number] Yes USE TO MONITOR FASTING  BLOOD SUGAR 3 TIMES DAILY  AS DIRECTED [place], Sue Nunnery, MD Taking Active   rosuvastatin (CRESTOR) 40 MG tablet [id number] Yes Take 1 tablet (40 mg total) by mouth daily. [place], Sue Nunnery, MD Taking Active   triamcinolone cream (KENALOG) 0.1 % [id number] Yes Apply 1 application topically 2 (two) times daily. Sue Rossetti, MD Taking Active           Patient Active Problem List   Diagnosis Date Noted  . Osteoporosis 06/30/2017  . Osteoarthritis, knee 12/16/2012  . Diabetes mellitus with neurological manifestation ([place]) 12/31/2011  . Essential hypertension, benign 12/31/2011  . Hyperlipidemia 12/31/2011  . Bilateral bunions 12/31/2011  . Diabetic neuropathy ([place]) 12/31/2011  . Obesity 12/31/2011    Immunization History  Administered Date(s) Administered  . Fluad Quad(high Dose 65+) 09/12/2019, 08/15/2020  . Influenza, High Dose Seasonal PF 06/30/2017  . Influenza,inj,Quad PF,6+ Mos 06/24/2013, 05/23/2014, 05/09/2015, 08/13/2016, 05/11/2018  . Influenza-Unspecified 09/12/2019  . Moderna Sars-Covid-2 Vaccination 11/19/2019, 12/21/2019,  08/15/2020  . Pneumococcal Conjugate-13 02/06/2015  . Pneumococcal Polysaccharide-23 04/22/2016  . Tdap 08/31/2013    Conditions to be addressed/monitored:  Hypertension and Hyperlipidemia, DM  Care Plan : General Pharmacy (Adult)  Updates made by Sue Wood, RPH since 11/19/2020 12:00 AM    Problem: HTN, HLD, DM, Osteoporosis   Priority: High  Onset Date: 11/19/2020    Goal: Patient-Specific Goal   Start Date: 11/19/2020  Expected End Date: 05/22/2021  This Visit's Progress: On track  Priority: High  Note:   Current Barriers:  . Unable to achieve control of cholesterol  . Suboptimal therapeutic regimen for DM  Pharmacist Clinical Goal(s):  Marland Kitchen Over the next 90 days, patient will achieve adherence to monitoring guidelines and medication adherence to achieve therapeutic efficacy . achieve control of cholesterol as evidenced by labs . maintain control of blood sugar as evidenced by home monitoring/A1c  . contact provider office for questions/concerns as evidenced notation of same in electronic health record through collaboration with PharmD and provider.   Interventions: . 1:1 collaboration with Sue Bear, NP regarding development and update of comprehensive plan of care as evidenced by provider attestation and co-signature . Inter-disciplinary care team collaboration (see longitudinal plan of care) . Comprehensive medication review performed; medication list updated in electronic medical record  Hypertension (BP goal <130/80) -Controlled -Current treatment: . Amlodipine $RemoveBefo'10mg'mGYFBjTawJP$  -Medications previously tried: none noted  -Current home readings: unable to assess -Current dietary habits: see DM -Current exercise habits: minimal -Denies hypotensive/hypertensive symptoms -Educated on BP goals and benefits of medications for prevention of heart attack, stroke and kidney damage; Daily salt intake goal < 2300 mg; Importance of home blood pressure  monitoring; -Counseled to monitor BP at home periodically, document, and provide log at future appointments -BP acceptable in office -She denies swelling. -Recommended to continue current medication  Diabetes (A1c goal <8%) -Controlled -Current medications:  Humalog 12 units with each meal  Levemir 38 units twice daily  Jardiance $RemoveBe'10mg'LicvarYYm$  daily -Medications previously tried: metformin (nausea)  -Current home glucose readings . fasting glucose: 140, 118, 157, 177 this am . post prandial glucose: unknown -Denies hypoglycemic/hyperglycemic symptoms -  Current meal patterns:   B - egg, sausage L - leftovers, pork chops, greens, potatoes D - baked meat, vegetables Snack - Patient has stopped eating her fruit cups with syrup! Liquids - water mostly  -Current exercise: minimal -Educated on A1c and blood sugar goals; Prevention and management of hypoglycemic episodes; Benefits of routine self-monitoring of blood sugar; Diet/Exercise -Counseled to check feet daily and get yearly eye exams -Recommended to continue current medication  -Recommend GFR to determine current kidney function, cannot find one in chart. -Caution for over basalization in insulin.  Would prefer to add back metformin, however patient refuses due to past experiences.  Continue regular A1c checks.  Hyperlipidemia: (LDL goal < 70) -Not ideally controlled -Current treatment: . Rosuvastatin $RemoveBefore'40mg'BDFhkWyxFgEUo$  -Medications previously tried: none noted  -Current dietary patterns: see above -Current exercise habits: minimal -Educated on Cholesterol goals;  Benefits of statin for ASCVD risk reduction; Importance of limiting foods high in cholesterol;  -LDL improved, confirmed patient now on correct dose -Recommended to continue current medication  -Recheck lipids at 3 month follow up -Would recommend TSH to be checked to rule out secondary hyperlipidemia  Osteoporosis (Goal Prevent fractures) -Controlled -Last DEXA Scan:  11/22/19   T-Score femoral neck: -0.1   T-Score forearm radius: -0.3  -Patient is not a candidate for pharmacologic treatment -Current treatment  . None noted -Medications previously tried: Fosamax -Patient had previous very good response with bisphosphonate therapy. -Recommended continue current management strategy. Patient is on drug holiday from Fosamax.   Patient Goals/Self-Care Activities . Over the next 90 days, patient will:  - take medications as prescribed check glucose daily, document, and provide at future appointments check blood pressure periodically, document, and provide at future appointments Contact providers with any hypoglycemia  Follow Up Plan: The care management team will reach out to the patient again over the next 90 days.        Medication Assistance: None required.  Patient affirms current coverage meets needs.  Patient's preferred pharmacy is:  [place] ([place]), [place] - [address] [place] ([place] [address] Phone: [phone] Fax: [place], [place] - [address] [place] [address] [address] [place] [address] Phone: [phone] Fax: [phone]  [place], [address] [place], [place] [address] [place] [address] Phone: [phone] Fax: [phone]  Uses pill box? No - patient prefers vials Pt endorses 100% compliance  We discussed: Benefits of medication synchronization, packaging and delivery as well as enhanced pharmacist oversight with [employer]. Patient decided to: Continue current medication management strategy  Care Plan and Follow Up Patient Decision:  Patient agrees to Care Plan and Follow-up.  Plan: The care management team will reach out to the patient again over the next 90 days.  Beverly Milch, PharmD Clinical Pharmacist [place] [phone]

## 2020-11-14 NOTE — Progress Notes (Signed)
Spoke with pt regarding labs. Pt has follow up appt in 3 months. Future lab work has already been placed for visit

## 2020-11-16 ENCOUNTER — Other Ambulatory Visit: Payer: Self-pay | Admitting: Family Medicine

## 2020-11-19 ENCOUNTER — Ambulatory Visit (INDEPENDENT_AMBULATORY_CARE_PROVIDER_SITE_OTHER): Payer: Medicare Other | Admitting: Pharmacist

## 2020-11-19 DIAGNOSIS — Z794 Long term (current) use of insulin: Secondary | ICD-10-CM | POA: Diagnosis not present

## 2020-11-19 DIAGNOSIS — I10 Essential (primary) hypertension: Secondary | ICD-10-CM

## 2020-11-19 DIAGNOSIS — E782 Mixed hyperlipidemia: Secondary | ICD-10-CM | POA: Diagnosis not present

## 2020-11-19 DIAGNOSIS — M810 Age-related osteoporosis without current pathological fracture: Secondary | ICD-10-CM

## 2020-11-19 DIAGNOSIS — E1143 Type 2 diabetes mellitus with diabetic autonomic (poly)neuropathy: Secondary | ICD-10-CM

## 2020-11-19 NOTE — Patient Instructions (Addendum)
Visit Information  Goals Addressed            This Visit's Progress   . Track and Manage My Blood Pressure-Hypertension       Timeframe:  Long-Range Goal Priority:  High Start Date:  11/19/20                           Expected End Date: 05/22/21                      Follow Up Date 03/07/21   - check blood pressure 3 times per week - write blood pressure results in a log or diary    Why is this important?    You won't feel high blood pressure, but it can still hurt your blood vessels.   High blood pressure can cause heart or kidney problems. It can also cause a stroke.   Making lifestyle changes like losing a little weight or eating less salt will help.   Checking your blood pressure at home and at different times of the day can help to control blood pressure.   If the doctor prescribes medicine remember to take it the way the doctor ordered.   Call the office if you cannot afford the medicine or if there are questions about it.     Notes:       Patient Care Plan: General Pharmacy (Adult)    Problem Identified: HTN, HLD, DM, Osteoporosis   Priority: High  Onset Date: 11/19/2020    Goal: Patient-Specific Goal   Start Date: 11/19/2020  Expected End Date: 05/22/2021  This Visit's Progress: On track  Priority: High  Note:   Current Barriers:  . Unable to achieve control of cholesterol  . Suboptimal therapeutic regimen for DM  Pharmacist Clinical Goal(s):  Marland Kitchen Over the next 90 days, patient will achieve adherence to monitoring guidelines and medication adherence to achieve therapeutic efficacy . achieve control of cholesterol as evidenced by labs . maintain control of blood sugar as evidenced by home monitoring/A1c  . contact provider office for questions/concerns as evidenced notation of same in electronic health record through collaboration with PharmD and provider.   Interventions: . 1:1 collaboration with Eulogio Bear, NP regarding development and update of  comprehensive plan of care as evidenced by provider attestation and co-signature . Inter-disciplinary care team collaboration (see longitudinal plan of care) . Comprehensive medication review performed; medication list updated in electronic medical record  Hypertension (BP goal <130/80) -Controlled -Current treatment: . Amlodipine 10mg  -Medications previously tried: none noted  -Current home readings: unable to assess -Current dietary habits: see DM -Current exercise habits: minimal -Denies hypotensive/hypertensive symptoms -Educated on BP goals and benefits of medications for prevention of heart attack, stroke and kidney damage; Daily salt intake goal < 2300 mg; Importance of home blood pressure monitoring; -Counseled to monitor BP at home periodically, document, and provide log at future appointments -BP acceptable in office -She denies swelling. -Recommended to continue current medication  Diabetes (A1c goal <8%) -Controlled -Current medications:  Humalog 12 units with each meal  Levemir 38 units twice daily  Jardiance 10mg  daily -Medications previously tried: metformin (nausea)  -Current home glucose readings . fasting glucose: 140, 118, 157, 177 this am . post prandial glucose: unknown -Denies hypoglycemic/hyperglycemic symptoms -Current meal patterns:   B - egg, sausage L - leftovers, pork chops, greens, potatoes D - baked meat, vegetables Snack - Patient has stopped eating her  fruit cups with syrup! Liquids - water mostly  -Current exercise: minimal -Educated on A1c and blood sugar goals; Prevention and management of hypoglycemic episodes; Benefits of routine self-monitoring of blood sugar; Diet/Exercise -Counseled to check feet daily and get yearly eye exams -Recommended to continue current medication  -Recommend GFR to determine current kidney function, cannot find one in chart. -Caution for over basalization in insulin.  Would prefer to add back metformin,  however patient refuses due to past experiences.  Continue regular A1c checks.  Hyperlipidemia: (LDL goal < 70) -Not ideally controlled -Current treatment: . Rosuvastatin 40mg  -Medications previously tried: none noted  -Current dietary patterns: see above -Current exercise habits: minimal -Educated on Cholesterol goals;  Benefits of statin for ASCVD risk reduction; Importance of limiting foods high in cholesterol;  -LDL improved, confirmed patient now on correct dose -Recommended to continue current medication  -Recheck lipids at 3 month follow up -Would recommend TSH to be checked to rule out secondary hyperlipidemia  Osteoporosis (Goal Prevent fractures) -Controlled -Last DEXA Scan: 11/22/19   T-Score femoral neck: -0.1   T-Score forearm radius: -0.3  -Patient is not a candidate for pharmacologic treatment -Current treatment  . None noted -Medications previously tried: Fosamax -Patient had previous very good response with bisphosphonate therapy. -Recommended continue current management strategy. Patient is on drug holiday from Fosamax.   Patient Goals/Self-Care Activities . Over the next 90 days, patient will:  - take medications as prescribed check glucose daily, document, and provide at future appointments check blood pressure periodically, document, and provide at future appointments Contact providers with any hypoglycemia  Follow Up Plan: The care management team will reach out to the patient again over the next 90 days.        The patient verbalized understanding of instructions, educational materials, and care plan provided today and agreed to receive a mailed copy of patient instructions, educational materials, and care plan.  Telephone follow up appointment with pharmacy team member scheduled for: 3 months  Edythe Clarity, [facility]  High Cholesterol  High cholesterol is a condition in which the blood has high levels of a white, waxy substance similar to fat  (cholesterol). The liver makes all the cholesterol that the body needs. The human body needs small amounts of cholesterol to help build cells. A person gets extra or excess cholesterol from the food that he or she eats. The blood carries cholesterol from the liver to the rest of the body. If you have high cholesterol, deposits (plaques) may build up on the walls of your arteries. Arteries are the blood vessels that carry blood away from your heart. These plaques make the arteries narrow and stiff. Cholesterol plaques increase your risk for heart attack and stroke. Work with your health care provider to keep your cholesterol levels in a healthy range. What increases the risk? The following factors may make you more likely to develop this condition:  Eating foods that are high in animal fat (saturated fat) or cholesterol.  Being overweight.  Not getting enough exercise.  A family history of high cholesterol (familial hypercholesterolemia).  Use of tobacco products.  Having diabetes. What are the signs or symptoms? There are no symptoms of this condition. How is this diagnosed? This condition may be diagnosed based on the results of a blood test.  If you are older than 73 years of age, your health care provider may check your cholesterol levels every 4-6 years.  You may be checked more often if you have high  cholesterol or other risk factors for heart disease. The blood test for cholesterol measures:  "Bad" cholesterol, or LDL cholesterol. This is the main type of cholesterol that causes heart disease. The desired level is less than 100 mg/dL.  "Good" cholesterol, or HDL cholesterol. HDL helps protect against heart disease by cleaning the arteries and carrying the LDL to the liver for processing. The desired level for HDL is 60 mg/dL or higher.  Triglycerides. These are fats that your body can store or burn for energy. The desired level is less than 150 mg/dL.  Total cholesterol. This  measures the total amount of cholesterol in your blood and includes LDL, HDL, and triglycerides. The desired level is less than 200 mg/dL. How is this treated? This condition may be treated with:  Diet changes. You may be asked to eat foods that have more fiber and less saturated fats or added sugar.  Lifestyle changes. These may include regular exercise, maintaining a healthy weight, and quitting use of tobacco products.  Medicines. These are given when diet and lifestyle changes have not worked. You may be prescribed a statin medicine to help lower your cholesterol levels. Follow these instructions at home: Eating and drinking  Eat a healthy, balanced diet. This diet includes: ? Daily servings of a variety of fresh, frozen, or canned fruits and vegetables. ? Daily servings of whole grain foods that are rich in fiber. ? Foods that are low in saturated fats and trans fats. These include poultry and fish without skin, lean cuts of meat, and low-fat dairy products. ? A variety of fish, especially oily fish that contain omega-3 fatty acids. Aim to eat fish at least 2 times a week.  Avoid foods and drinks that have added sugar.  Use healthy cooking methods, such as roasting, grilling, broiling, baking, poaching, steaming, and stir-frying. Do not fry your food except for stir-frying.   Lifestyle  Get regular exercise. Aim to exercise for a total of 150 minutes a week. Increase your activity level by doing activities such as gardening, walking, and taking the stairs.  Do not use any products that contain nicotine or tobacco, such as cigarettes, e-cigarettes, and chewing tobacco. If you need help quitting, ask your health care provider.   General instructions  Take over-the-counter and prescription medicines only as told by your health care provider.  Keep all follow-up visits as told by your health care provider. This is important. Where to find more information  American Heart  Association: www.heart.org  National Heart, Lung, and Blood Institute: https://wilson-eaton.com/ Contact a health care provider if:  You have trouble achieving or maintaining a healthy diet or weight.  You are starting an exercise program.  You are unable to stop smoking. Get help right away if:  You have chest pain.  You have trouble breathing.  You have any symptoms of a stroke. "BE FAST" is an easy way to remember the main warning signs of a stroke: ? B - Balance. Signs are dizziness, sudden trouble walking, or loss of balance. ? E - Eyes. Signs are trouble seeing or a sudden change in vision. ? F - Face. Signs are sudden weakness or numbness of the face, or the face or eyelid drooping on one side. ? A - Arms. Signs are weakness or numbness in an arm. This happens suddenly and usually on one side of the body. ? S - Speech. Signs are sudden trouble speaking, slurred speech, or trouble understanding what people say. ? T - Time.  Time to call emergency services. Write down what time symptoms started.  You have other signs of a stroke, such as: ? A sudden, severe headache with no known cause. ? Nausea or vomiting. ? Seizure. These symptoms may represent a serious problem that is an emergency. Do not wait to see if the symptoms will go away. Get medical help right away. Call your local emergency services (911 in the [place] Do not drive yourself to the hospital. Summary  Cholesterol plaques increase your risk for heart attack and stroke. Work with your health care provider to keep your cholesterol levels in a healthy range.  Eat a healthy, balanced diet, get regular exercise, and maintain a healthy weight.  Do not use any products that contain nicotine or tobacco, such as cigarettes, e-cigarettes, and chewing tobacco.  Get help right away if you have any symptoms of a stroke. This information is not intended to replace advice given to you by your health care provider. Make sure you discuss  any questions you have with your health care provider. Document Revised: 07/25/2019 Document Reviewed: 07/25/2019 Elsevier Patient Education  2021 [employer].

## 2020-11-29 ENCOUNTER — Other Ambulatory Visit: Payer: Self-pay | Admitting: Family Medicine

## 2020-11-29 DIAGNOSIS — E1143 Type 2 diabetes mellitus with diabetic autonomic (poly)neuropathy: Secondary | ICD-10-CM

## 2020-11-29 DIAGNOSIS — Z794 Long term (current) use of insulin: Secondary | ICD-10-CM

## 2020-12-05 ENCOUNTER — Telehealth: Payer: Self-pay | Admitting: Pharmacist

## 2020-12-05 NOTE — Progress Notes (Addendum)
    Chronic Care Management Pharmacy Assistant   Name: Sue Wood  MRN: [id number] DOB: 15-Feb-1946  Reason for Encounter: Adherence Review  Verified Adherence Gap Information. Per insurance data, the patient is 90-99% compliant with the CHOL (Rosuvastatin 20 mg) medication. Per insurance data patient has not met their annual wellness or wellness bundle screening, but did met their colorectal cancer screening.Their most recent A1C was 9.4 on 03/21/20. The patients most recent blood pressure was 132/66 on 03/20/20. The patients total gaps-all measures is equal to 4. The patients total gaps-all measures is equal to 2.  Follow-Up: Pharmacist Review  Charlann Lange, [place] Pharmacist Assistant [phone]

## 2020-12-31 ENCOUNTER — Telehealth: Payer: Self-pay | Admitting: Pharmacist

## 2020-12-31 NOTE — Progress Notes (Addendum)
Chronic Care Management Pharmacy Assistant   Name: Sue Wood  MRN: [id number] DOB: Jun 01, 1946   Reason for Encounter: Disease State For DM   Conditions to be addressed/monitored: HTN, Diabetes, HLD, osteoporosis, osteoarthritis.  Recent office visits:  None since 12/05/20  Recent consult visits:  None since 12/05/20   Hospital visits:  None since 12/05/20  Medications: Outpatient Encounter Medications as of 12/31/2020  Medication Sig   Alcohol Swabs 70 % PADS Use 1-4 times daily prn   amitriptyline (ELAVIL) 50 MG tablet TAKE 1 TABLET BY MOUTH AT  BEDTIME   amLODipine (NORVASC) 10 MG tablet TAKE 1 TABLET BY MOUTH  DAILY   aspirin 81 MG tablet Take 1 tablet (81 mg total) by mouth at bedtime.   Blood Glucose Monitoring Suppl (BLOOD GLUCOSE SYSTEM PAK) KIT Please dispense based on patient and insurance preference. Use as directed to monitor FSBS 3x daily. Dx: E11.9.   empagliflozin (JARDIANCE) 10 MG TABS tablet Take 1 tablet (10 mg total) by mouth daily before breakfast.   HUMALOG KWIKPEN 100 UNIT/ML KwikPen INJECT 12 UNITS SUBCUTANEOUSLY WITH  EACH  MEAL   Lancets MISC Please dispense based on patient and insurance preference. Use as directed to monitor FSBS 3x daily. Dx: E11.9.   LEVEMIR FLEXTOUCH 100 UNIT/ML FlexPen INJECT 36 UNITS SUBCUTANEOUSLY TWICE DAILY   ONETOUCH ULTRA test strip USE TO MONITOR FASTING  BLOOD SUGAR 3 TIMES DAILY  AS DIRECTED   rosuvastatin (CRESTOR) 40 MG tablet Take 1 tablet (40 mg total) by mouth daily.   triamcinolone cream (KENALOG) 0.1 % Apply 1 application topically 2 (two) times daily.   No facility-administered encounter medications on file as of 12/31/2020.    Recent Relevant Labs: Lab Results  Component Value Date/Time   HGBA1C 8.0 (H) 11/12/2020 08:25 AM   HGBA1C 9.7 (H) 08/15/2020 08:34 AM   HGBA1C 10.2 (H) 04/10/2014 09:48 AM   HGBA1C 9.8 (H) 12/27/2013 08:45 AM   MICROALBUR 9.4 08/15/2020 08:34 AM   MICROALBUR 10.6 09/12/2019  03:19 PM    Kidney Function Lab Results  Component Value Date/Time   CREATININE 0.86 11/12/2020 08:25 AM   CREATININE 0.85 08/15/2020 08:34 AM   GFRNONAA 67 11/12/2020 08:25 AM   GFRAA 78 11/12/2020 08:25 AM    Current antihyperglycemic regimen:  Levemir 100u/mL 38 units twice daily Humalog 100u/mL 12 units three times daily (before each meal) Jardiance 10 mg daily  What recent interventions/DTPs have been made to improve glycemic control:  None  Have there been any recent hospitalizations or ED visits since last visit with [facility]PP?  Patient stated no.  Patient denies hypoglycemic symptoms, including None   Patient denies hyperglycemic symptoms, including none   How often are you checking your blood sugar? 3-4 times daily   What are your blood sugars ranging?  Patient stated her blood sugar readings are between 138-145    During the week, how often does your blood glucose drop below 70? Patient stated Never   Are you checking your feet daily/regularly?  Patient stated she checks her feet daily.   Adherence Review: Is the patient currently on a STATIN medication? Rosuvastatin 40 mg  Is the patient currently on ACE/ARB medication? None.  Does the patient have >5 day gap between last estimated fill dates? Per misc rpts, no.  Star Rating Drugs:Jardiance 10 mg 90 DS 09/02/20, Rosuvastatin 40 mg 90 DS 11/24/20  Follow-Up:Pharmacist review  Hulen Luster, RMA Clinical Pharmacist Assistant [phone]  6 minutes spent in review,  coordination, and documentation.  Reviewed by: Beverly Milch, PharmD Clinical Pharmacist [facility] [phone]

## 2021-01-16 ENCOUNTER — Other Ambulatory Visit: Payer: Self-pay

## 2021-01-16 DIAGNOSIS — Z794 Long term (current) use of insulin: Secondary | ICD-10-CM

## 2021-01-16 MED ORDER — HUMALOG KWIKPEN 100 UNIT/ML ~~LOC~~ SOPN: PEN_INJECTOR | SUBCUTANEOUS | 0 refills | Status: DC

## 2021-02-04 ENCOUNTER — Other Ambulatory Visit: Payer: Self-pay | Admitting: Family Medicine

## 2021-02-04 DIAGNOSIS — E1143 Type 2 diabetes mellitus with diabetic autonomic (poly)neuropathy: Secondary | ICD-10-CM

## 2021-02-21 ENCOUNTER — Ambulatory Visit (INDEPENDENT_AMBULATORY_CARE_PROVIDER_SITE_OTHER): Payer: Medicare Other | Admitting: Pharmacist

## 2021-02-21 DIAGNOSIS — E1143 Type 2 diabetes mellitus with diabetic autonomic (poly)neuropathy: Secondary | ICD-10-CM | POA: Diagnosis not present

## 2021-02-21 DIAGNOSIS — I10 Essential (primary) hypertension: Secondary | ICD-10-CM

## 2021-02-21 DIAGNOSIS — Z794 Long term (current) use of insulin: Secondary | ICD-10-CM | POA: Diagnosis not present

## 2021-02-21 NOTE — Patient Instructions (Signed)
Visit Information   Goals Addressed   None    Patient Care Plan: General Pharmacy (Adult)     Problem Identified: HTN, HLD, DM, Osteoporosis   Priority: High  Onset Date: 11/19/2020     Goal: Patient-Specific Goal   Start Date: 11/19/2020  Expected End Date: 05/22/2021  This Visit's Progress: On track  Priority: High  Note:   Current Barriers:  Unable to achieve control of cholesterol  Suboptimal therapeutic regimen for DM  Pharmacist Clinical Goal(s):  Over the next 90 days, patient will achieve adherence to monitoring guidelines and medication adherence to achieve therapeutic efficacy achieve control of cholesterol as evidenced by labs maintain control of blood sugar as evidenced by home monitoring/A1c  contact provider office for questions/concerns as evidenced notation of same in electronic health record through collaboration with PharmD and provider.   Interventions: 1:1 collaboration with Eulogio Bear, NP regarding development and update of comprehensive plan of care as evidenced by provider attestation and co-signature Inter-disciplinary care team collaboration (see longitudinal plan of care) Comprehensive medication review performed; medication list updated in electronic medical record  Hypertension (BP goal <130/80) -Controlled -Current treatment: Amlodipine 10mg  -Medications previously tried: none noted  -Current home readings: unable to assess -Current dietary habits: see DM -Current exercise habits: minimal -Denies hypotensive/hypertensive symptoms -Educated on BP goals and benefits of medications for prevention of heart attack, stroke and kidney damage; Daily salt intake goal < 2300 mg; Importance of home blood pressure monitoring; -Counseled to monitor BP at home periodically, document, and provide log at future appointments -BP acceptable in office -She denies swelling. -Recommended to continue current medication  Diabetes (A1c goal  <8%) -Controlled -Current medications: Humalog 12 units with each meal Levemir 38 units twice daily Jardiance 10mg  daily -Medications previously tried: metformin (nausea)  -Current home glucose readings fasting glucose: 140, 118, 157, 177 this am post prandial glucose: unknown -Denies hypoglycemic/hyperglycemic symptoms -Current meal patterns:   B - egg, sausage L - leftovers, pork chops, greens, potatoes D - baked meat, vegetables Snack - Patient has stopped eating her fruit cups with syrup! Liquids - water mostly  -Current exercise: minimal -Educated on A1c and blood sugar goals; Prevention and management of hypoglycemic episodes; Benefits of routine self-monitoring of blood sugar; Diet/Exercise -Counseled to check feet daily and get yearly eye exams -Recommended to continue current medication  -Recommend GFR to determine current kidney function, cannot find one in chart. -Caution for over basalization in insulin.  Would prefer to add back metformin, however patient refuses due to past experiences.  Continue regular A1c checks.  Hyperlipidemia: (LDL goal < 70) -Not ideally controlled -Current treatment: Rosuvastatin 40mg  -Medications previously tried: none noted  -Current dietary patterns: see above -Current exercise habits: minimal -Educated on Cholesterol goals;  Benefits of statin for ASCVD risk reduction; Importance of limiting foods high in cholesterol;  -LDL improved, confirmed patient now on correct dose -Recommended to continue current medication  -Recheck lipids at 3 month follow up -Would recommend TSH to be checked to rule out secondary hyperlipidemia  Osteoporosis (Goal Prevent fractures) -Controlled -Last DEXA Scan: 11/22/19   T-Score femoral neck: -0.1   T-Score forearm radius: -0.3  -Patient is not a candidate for pharmacologic treatment -Current treatment  None noted -Medications previously tried: Fosamax -Patient had previous very good  response with bisphosphonate therapy. -Recommended continue current management strategy. Patient is on drug holiday from Fosamax.   Patient Goals/Self-Care Activities Over the next 90 days, patient will:  - take medications  as prescribed check glucose daily, document, and provide at future appointments check blood pressure periodically, document, and provide at future appointments Contact providers with any hypoglycemia  Follow Up Plan: The care management team will reach out to the patient again over the next 90 days.        The patient verbalized understanding of instructions, educational materials, and care plan provided today and declined offer to receive copy of patient instructions, educational materials, and care plan.  The pharmacy team will reach out to the patient again over the next 120 days.   Edythe Clarity, [facility]

## 2021-02-21 NOTE — Progress Notes (Signed)
Chronic Care Management Pharmacy Note  02/21/2021 Name:  Sue Wood MRN:  [id number] DOB:  23-Jun-1946  Subjective: Sue Wood is an 75 y.o. year old female who is a primary patient of Eulogio Bear, NP.  The CCM team was consulted for assistance with disease management and care coordination needs.    Engaged with patient by telephone for follow up visit in response to provider referral for pharmacy case management and/or care coordination services.   Consent to Services:  The patient was given the following information about Chronic Care Management services today, agreed to services, and gave verbal consent: 1. CCM service includes personalized support from designated clinical staff supervised by the primary care provider, including individualized plan of care and coordination with other care providers 2. 24/7 contact phone numbers for assistance for urgent and routine care needs. 3. Service will only be billed when office clinical staff spend 20 minutes or more in a month to coordinate care. 4. Only one practitioner may furnish and bill the service in a calendar month. 5.The patient may stop CCM services at any time (effective at the end of the month) by phone call to the office staff. 6. The patient will be responsible for cost sharing (co-pay) of up to 20% of the service fee (after annual deductible is met). Patient agreed to services and consent obtained.  Patient Care Team: Eulogio Bear, NP as PCP - General (Nurse Practitioner) Edythe Clarity, [facility] as Pharmacist (Pharmacist)  Recent office visits: None since last CCM visit  Recent consult visits: None since last CCM visit  Hospital visits: None in previous 6 months  Objective:  Lab Results  Component Value Date   CREATININE 0.86 11/12/2020   BUN 18 11/12/2020   GFRNONAA 67 11/12/2020   GFRAA 78 11/12/2020   NA 139 11/12/2020   K 4.4 11/12/2020   CALCIUM 9.7 11/12/2020   CO2 25 11/12/2020     Lab Results  Component Value Date/Time   HGBA1C 8.0 (H) 11/12/2020 08:25 AM   HGBA1C 9.7 (H) 08/15/2020 08:34 AM   HGBA1C 10.2 (H) 04/10/2014 09:48 AM   HGBA1C 9.8 (H) 12/27/2013 08:45 AM   MICROALBUR 9.4 08/15/2020 08:34 AM   MICROALBUR 10.6 09/12/2019 03:19 PM    Last diabetic Eye exam:  Lab Results  Component Value Date/Time   HMDIABEYEEXA No Retinopathy 06/24/2018 12:00 AM    Last diabetic Foot exam: No results found for: HMDIABFOOTEX   Lab Results  Component Value Date   CHOL 186 11/12/2020   HDL 59 11/12/2020   LDLCALC 112 (H) 11/12/2020   TRIG 68 11/12/2020   CHOLHDL 3.2 11/12/2020    Hepatic Function Latest Ref Rng & Units 08/15/2020 03/20/2020 09/12/2019  Total Protein 6.1 - 8.1 g/dL 7.5 7.4 8.2(H)  Albumin 3.6 - 5.1 g/dL - - -  AST 10 - 35 U/L $Remo'17 15 18  'gGNTT$ ALT 6 - 29 U/L $Remo'15 13 18  'nusbF$ Alk Phosphatase 33 - 130 U/L - - -  Total Bilirubin 0.2 - 1.2 mg/dL 0.3 0.4 0.3    No results found for: TSH, FREET4  CBC Latest Ref Rng & Units 08/15/2020 09/12/2019 12/10/2018  WBC 3.8 - 10.8 Thousand/uL 5.2 5.7 4.4  Hemoglobin 11.7 - 15.5 g/dL 13.1 13.1 13.0  Hematocrit 35.0 - 45.0 % 38.6 39.8 38.3  Platelets 140 - 400 Thousand/uL 247 256 274    No results found for: VD25OH  Clinical ASCVD: No  The 10-year ASCVD risk score (  Renaye Rakers., et al., 2013) is: 30.2%   Values used to calculate the score:     Age: 65 years     Sex: Female     Is Non-Hispanic African American: Yes     Diabetic: Yes     Tobacco smoker: No     Systolic Blood Pressure: 409 mmHg     Is BP treated: Yes     HDL Cholesterol: 59 mg/dL     Total Cholesterol: 186 mg/dL    Depression screen [facility] 2/9 08/15/2020 10/18/2019 09/12/2019  Decreased Interest 0 0 0  Down, Depressed, Hopeless 0 0 0  PHQ - 2 Score 0 0 0  Altered sleeping - - -  Tired, decreased energy - - -  Change in appetite - - -  Feeling bad or failure about yourself  - - -  Trouble concentrating - - -  Moving slowly or fidgety/restless - - -   Suicidal thoughts - - -  PHQ-9 Score - - -  Difficult doing work/chores - - -     Social History   Tobacco Use  Smoking Status Former   Packs/day: 0.25   Years: 18.00   Pack years: 4.50   Types: Cigarettes  Smokeless Tobacco Never  Tobacco Comments   quit 1980's   BP Readings from Last 3 Encounters:  11/12/20 132/76  08/15/20 122/60  03/20/20 132/66   Pulse Readings from Last 3 Encounters:  11/12/20 95  08/15/20 90  03/20/20 96   Wt Readings from Last 3 Encounters:  11/12/20 218 lb 12.8 oz (99.2 kg)  08/15/20 215 lb (97.5 kg)  03/20/20 226 lb (102.5 kg)    Assessment/Interventions: Review of patient past medical history, allergies, medications, health status, including review of consultants reports, laboratory and other test data, was performed as part of comprehensive evaluation and provision of chronic care management services.   SDOH:  (Social Determinants of Health) assessments and interventions performed: Yes  Financial Resource Strain: Low Risk    Difficulty of Paying Living Expenses: Not very hard    CCM Care Plan  Allergies  Allergen Reactions   Lisinopril Swelling    Lip swelling     Medications Reviewed Today     Reviewed by Edythe Clarity, RPH (Pharmacist) on 02/21/21 at 1121  Med List Status: <None>   Medication Order Taking? Sig Documenting Provider Last Dose Status Informant  Alcohol Swabs 70 % PADS [id number] Yes Use 1-4 times daily prn Alycia Rossetti, MD Taking Active   amitriptyline (ELAVIL) 50 MG tablet [id number] Yes TAKE 1 TABLET BY MOUTH AT  BEDTIME Alycia Rossetti, MD Taking Active   amLODipine (NORVASC) 10 MG tablet [id number] Yes TAKE 1 TABLET BY MOUTH  DAILY [place], Modena Nunnery, MD Taking Active   aspirin 81 MG tablet [id number] Yes Take 1 tablet (81 mg total) by mouth at bedtime. [place], Modena Nunnery, MD Taking Active   Blood Glucose Monitoring Suppl (BLOOD GLUCOSE SYSTEM PAK) KIT [id number] Yes Please dispense based on patient and  insurance preference. Use as directed to monitor FSBS 3x daily. Dx: E11.9. [place], Modena Nunnery, MD Taking Active   empagliflozin (JARDIANCE) 10 MG TABS tablet [id number] Yes Take 1 tablet (10 mg total) by mouth daily before breakfast. Alycia Rossetti, MD Taking Active   HUMALOG KWIKPEN 100 UNIT/ML KwikPen [id number] Yes INJECT 12 UNITS INTO THE SKIN WITH EACH MEAL. Eulogio Bear, NP Taking Active   Lancets [place] [id number] Yes Please dispense based on patient and  insurance preference. Use as directed to monitor FSBS 3x daily. Dx: E11.9. Alycia Rossetti, MD Taking Active   LEVEMIR [facility] 100 UNIT/ML FlexPen [id number] Yes INJECT 36 UNITS SUBCUTANEOUSLY TWICE DAILY Susy Frizzle, MD Taking Active   [facility] ULTRA test strip [id number] Yes USE TO MONITOR FASTING  BLOOD SUGAR 3 TIMES DAILY  AS DIRECTED [place], Modena Nunnery, MD Taking Active   rosuvastatin (CRESTOR) 40 MG tablet [id number] Yes Take 1 tablet (40 mg total) by mouth daily. [place], Modena Nunnery, MD Taking Active   triamcinolone cream (KENALOG) 0.1 % [id number] Yes Apply 1 application topically 2 (two) times daily. Alycia Rossetti, MD Taking Active             Patient Active Problem List   Diagnosis Date Noted   Osteoporosis 06/30/2017   Osteoarthritis, knee 12/16/2012   Diabetes mellitus with neurological manifestation ([place]) 12/31/2011   Essential hypertension, benign 12/31/2011   Hyperlipidemia 12/31/2011   Bilateral bunions 12/31/2011   Diabetic neuropathy ([place]) 12/31/2011   Obesity 12/31/2011    Immunization History  Administered Date(s) Administered   Fluad Quad(high Dose 65+) 09/12/2019, 08/15/2020   Influenza, High Dose Seasonal PF 06/30/2017   Influenza,inj,Quad PF,6+ Mos 06/24/2013, 05/23/2014, 05/09/2015, 08/13/2016, 05/11/2018   Influenza-Unspecified 09/12/2019   Moderna Sars-Covid-2 Vaccination 11/19/2019, 12/21/2019, 08/15/2020   Pneumococcal Conjugate-13 02/06/2015   Pneumococcal Polysaccharide-23 04/22/2016    Tdap 08/31/2013    Conditions to be addressed/monitored:  Hypertension and Hyperlipidemia, DM  Care Plan : General Pharmacy (Adult)  Updates made by Edythe Clarity, RPH since 02/21/2021 12:00 AM     Problem: HTN, HLD, DM, Osteoporosis   Priority: High  Onset Date: 11/19/2020     Goal: Patient-Specific Goal   Start Date: 11/19/2020  Expected End Date: 05/22/2021  Recent Progress: On track  Priority: High  Note:   Current Barriers:  Unable to achieve control of cholesterol  Suboptimal therapeutic regimen for DM  Pharmacist Clinical Goal(s):  Over the next 90 days, patient will achieve adherence to monitoring guidelines and medication adherence to achieve therapeutic efficacy achieve control of cholesterol as evidenced by labs maintain control of blood sugar as evidenced by home monitoring/A1c  contact provider office for questions/concerns as evidenced notation of same in electronic health record through collaboration with PharmD and provider.   Interventions: 1:1 collaboration with Eulogio Bear, NP regarding development and update of comprehensive plan of care as evidenced by provider attestation and co-signature Inter-disciplinary care team collaboration (see longitudinal plan of care) Comprehensive medication review performed; medication list updated in electronic medical record  Hypertension (BP goal <130/80) -Controlled -Current treatment: Amlodipine 10mg  -Medications previously tried: none noted  -Current home readings: unable to assess -Current dietary habits: see DM -Current exercise habits: minimal -Denies hypotensive/hypertensive symptoms -Educated on BP goals and benefits of medications for prevention of heart attack, stroke and kidney damage; Daily salt intake goal < 2300 mg; Importance of home blood pressure monitoring; -Counseled to monitor BP at home periodically, document, and provide log at future appointments -BP acceptable in office -She  denies swelling. -Recommended to continue current medication  Update 02/21/21 Not checking her BP at home She continues to deny dizziness or HA's Has upcoming visit with Janett Billow this week Continue current meds for now  Diabetes (A1c goal <8%) -Controlled -Current medications: Humalog 12 units with each meal Levemir 38 units twice daily Jardiance 10mg  daily -Medications previously tried: metformin (nausea)  -Current home glucose readings fasting glucose: 140, 118, 157, 177 this am post  prandial glucose: unknown -Denies hypoglycemic/hyperglycemic symptoms -Current meal patterns:   B - egg, sausage L - leftovers, pork chops, greens, potatoes D - baked meat, vegetables Snack - Patient has stopped eating her fruit cups with syrup! Liquids - water mostly  -Current exercise: minimal -Educated on A1c and blood sugar goals; Prevention and management of hypoglycemic episodes; Benefits of routine self-monitoring of blood sugar; Diet/Exercise -Counseled to check feet daily and get yearly eye exams -Recommended to continue current medication  -Recommend GFR to determine current kidney function, cannot find one in chart. -Caution for over basalization in insulin.  Would prefer to add back metformin, however patient refuses due to past experiences.  Continue regular A1c checks.   Update 02/21/21 Fasting blood sugars - 158, she did not have the rest of her logs with her Have asked her to bring blood sugar logs to appt with Janett Billow tomorrow Continues to improve her lifestyle/diet choices. Recommend checking A1c tomorrow.  If it remains elevated she may benefit from increasing Jardiance to 25mg  daily.  Hyperlipidemia: (LDL goal < 70) -Not ideally controlled -Current treatment: Rosuvastatin 40mg  -Medications previously tried: none noted  -Current dietary patterns: see above -Current exercise habits: minimal -Educated on Cholesterol goals;  Benefits of statin for ASCVD risk  reduction; Importance of limiting foods high in cholesterol;  -LDL improved, confirmed patient now on correct dose -Recommended to continue current medication  -Recheck lipids at 3 month follow up -Would recommend TSH to be checked to rule out secondary hyperlipidemia  Osteoporosis (Goal Prevent fractures) -Controlled -Last DEXA Scan: 11/22/19   T-Score femoral neck: -0.1   T-Score forearm radius: -0.3  -Patient is not a candidate for pharmacologic treatment -Current treatment  None noted -Medications previously tried: Fosamax -Patient had previous very good response with bisphosphonate therapy. -Recommended continue current management strategy. Patient is on drug holiday from Fosamax.   Patient Goals/Self-Care Activities Over the next 90 days, patient will:  - take medications as prescribed check glucose daily, document, and provide at future appointments check blood pressure periodically, document, and provide at future appointments Contact providers with any hypoglycemia  Follow Up Plan: The care management team will reach out to the patient again over the next 90 days.            Medication Assistance: None required.  Patient affirms current coverage meets needs.  Patient's preferred pharmacy is:  [place] ([place]), [place] - [address] [place] ([place]) [place] [address] Phone: [phone] Fax: [phone]  [place], [place] - [address] [place] [address] [place] [address] [place] [address] Phone: [phone] Fax: [phone]  [facility]  ([place], [place], [address] [place], [place] [address] [place] [address] Phone: [phone] Fax: [phone]  Uses pill box? No - patient prefers vials Pt endorses 100% compliance  We discussed: Benefits of medication synchronization, packaging and delivery as well as enhanced pharmacist  oversight with [employer]. Patient decided to: Continue current medication management strategy  Care Plan and Follow Up Patient Decision:  Patient agrees to Care Plan and Follow-up.  Plan: The care management team will reach out to the patient again over the next 90 days.  Beverly Milch, PharmD Clinical Pharmacist [place] [phone]

## 2021-02-22 ENCOUNTER — Encounter: Payer: Self-pay | Admitting: Nurse Practitioner

## 2021-02-22 ENCOUNTER — Ambulatory Visit (INDEPENDENT_AMBULATORY_CARE_PROVIDER_SITE_OTHER): Payer: Medicare Other | Admitting: Nurse Practitioner

## 2021-02-22 ENCOUNTER — Other Ambulatory Visit: Payer: Self-pay

## 2021-02-22 DIAGNOSIS — I10 Essential (primary) hypertension: Secondary | ICD-10-CM | POA: Diagnosis not present

## 2021-02-22 DIAGNOSIS — E782 Mixed hyperlipidemia: Secondary | ICD-10-CM | POA: Diagnosis not present

## 2021-02-22 DIAGNOSIS — Z794 Long term (current) use of insulin: Secondary | ICD-10-CM

## 2021-02-22 DIAGNOSIS — E1143 Type 2 diabetes mellitus with diabetic autonomic (poly)neuropathy: Secondary | ICD-10-CM

## 2021-02-22 NOTE — Assessment & Plan Note (Signed)
Chronic.  Blood pressure slightly above goal today in office.  Goal for this patient less than 130/80 given history of diabetes.  Encouraged patient to check blood pressure at home and notify us if blood pressure consistently greater than 130/80.  Can consider adding in hydrochlorothiazide if remains elevated.  We will check kidney function and electrolytes today.  Follow-up in 3 months.

## 2021-02-22 NOTE — Assessment & Plan Note (Signed)
Chronic.  A1c checked today.  Reports taking medications consistently and blood sugar meter today shows average glucose less than 150.  Reports eye exam is up-to-date-records have been requested.  Foot exam today is normal.  Plan to continue Jardiance 10 mg daily, Levemir 38 units twice daily, and Humalog 12 units 3 times daily with meals.-goal for A1c remains less than 8%.  Follow-up 3 months.

## 2021-02-22 NOTE — Progress Notes (Signed)
  Subjective:    Patient ID: Sue Wood, female    DOB: 01/18/1946, 75 y.o.   MRN: [id number]  HPI: Sue Wood is a 75 y.o. female presenting for follow up.  Chief Complaint  Patient presents with   Diabetes    Sent consent for vision notes to [facility] 2 month ago, did 2 nd booster, note sure of date. Fasting for labs   DIABETES Currently taking insulin and Jardiance for diabetes.   Hypoglycemic episodes:no Polydipsia/polyuria: no Visual disturbance: no Chest pain: no Paresthesias: yes; in toes mostly; worse with dress shoes Glucose Monitoring: yes  Accucheck frequency: three times daily  Fasting glucose: consistently around 150  Highest >300, otherwise all less than 200 Taking Insulin?: yes  Long acting insulin: 38 units  Short acting insulin: 12 units Blood Pressure Monitoring: not checking Retinal Examination: Up to Date Foot Exam: Up to Date Diabetic Education: Completed Pneumovax: Up to Date Influenza: Up to Date Aspirin: yes  HYPERTENSION / HYPERLIPIDEMIA Currently taking amlodipine 10 mg daily and crestor 40 mg daily for high blood pressure and high cholesterol.  Does have some ankle swelling that is not very bothersome. Satisfied with current treatment? yes Duration of hypertension: chronic BP monitoring frequency: not checking BP medication side effects: no Duration of hyperlipidemia: chronic Cholesterol medication side effects: no Cholesterol supplements: none Aspirin: yes Recent stressors: no Recurrent headaches: no Visual changes: no Palpitations: no Dyspnea: no Chest pain: no Lower extremity edema: no Dizzy/lightheaded: no   Allergies  Allergen Reactions   Lisinopril Swelling    Lip swelling     Outpatient Encounter Medications as of 02/22/2021  Medication Sig   Alcohol Swabs 70 % PADS Use 1-4 times daily prn   amitriptyline (ELAVIL) 50 MG tablet TAKE 1 TABLET BY MOUTH AT  BEDTIME   amLODipine (NORVASC) 10 MG tablet TAKE  1 TABLET BY MOUTH  DAILY   aspirin 81 MG tablet Take 1 tablet (81 mg total) by mouth at bedtime.   Blood Glucose Monitoring Suppl (BLOOD GLUCOSE SYSTEM PAK) KIT Please dispense based on patient and insurance preference. Use as directed to monitor FSBS 3x daily. Dx: E11.9.   empagliflozin (JARDIANCE) 10 MG TABS tablet Take 1 tablet (10 mg total) by mouth daily before breakfast.   HUMALOG KWIKPEN 100 UNIT/ML KwikPen INJECT 12 UNITS INTO THE SKIN WITH EACH MEAL.   Lancets MISC Please dispense based on patient and insurance preference. Use as directed to monitor FSBS 3x daily. Dx: E11.9.   LEVEMIR FLEXTOUCH 100 UNIT/ML FlexPen INJECT 36 UNITS SUBCUTANEOUSLY TWICE DAILY   ONETOUCH ULTRA test strip USE TO MONITOR FASTING  BLOOD SUGAR 3 TIMES DAILY  AS DIRECTED   rosuvastatin (CRESTOR) 40 MG tablet Take 1 tablet (40 mg total) by mouth daily.   triamcinolone cream (KENALOG) 0.1 % Apply 1 application topically 2 (two) times daily.   No facility-administered encounter medications on file as of 02/22/2021.    Patient Active Problem List   Diagnosis Date Noted   Osteoporosis 06/30/2017   Osteoarthritis, knee 12/16/2012   Diabetes mellitus with neurological manifestation ([facility]) 12/31/2011   Essential hypertension, benign 12/31/2011   Hyperlipidemia 12/31/2011   Bilateral bunions 12/31/2011   Diabetic neuropathy ([facility]) 12/31/2011   Obesity 12/31/2011    Past Medical History:  Diagnosis Date   Arthritis    Diabetes mellitus    Hyperlipidemia    Hypertension    Peripheral neuropathy     Relevant past medical, surgical, family and social history   reviewed and updated as indicated. Interim medical history since our last visit reviewed.  Review of Systems Per HPI unless specifically indicated above     Objective:    BP 138/82 (BP Location: Left Arm, Cuff Size: Normal)   Pulse 88   Temp 98.1 F (36.7 C)   Ht 5' 7" (1.702 m)   Wt 215 lb (97.5 kg)   SpO2 98%   BMI 33.67 kg/m   Wt Readings  from Last 3 Encounters:  02/22/21 215 lb (97.5 kg)  11/12/20 218 lb 12.8 oz (99.2 kg)  08/15/20 215 lb (97.5 kg)    Physical Exam Vitals and nursing note reviewed.  Constitutional:      General: She is not in acute distress.    Appearance: Normal appearance. She is not toxic-appearing.  HENT:     Head: Normocephalic and atraumatic.  Neck:     Vascular: No carotid bruit.  Cardiovascular:     Rate and Rhythm: Normal rate and regular rhythm.     Heart sounds: Normal heart sounds. No murmur heard. Pulmonary:     Effort: Pulmonary effort is normal. No respiratory distress.     Breath sounds: Normal breath sounds. No wheezing, rhonchi or rales.  Abdominal:     General: Abdomen is flat. Bowel sounds are normal. There is no distension.     Palpations: Abdomen is soft. There is no mass.  Musculoskeletal:     Cervical back: Normal range of motion.  Skin:    General: Skin is warm and dry.     Capillary Refill: Capillary refill takes less than 2 seconds.     Coloration: Skin is not jaundiced or pale.     Findings: No erythema.  Neurological:     General: No focal deficit present.     Mental Status: She is alert and oriented to person, place, and time.     Motor: No weakness.     Gait: Gait normal.  Psychiatric:        Wood and Affect: Wood normal.        Behavior: Behavior normal.        Thought Content: Thought content normal.        Judgment: Judgment normal.      Assessment & Plan:   Problem List Items Addressed This Visit       Cardiovascular and Mediastinum   Essential hypertension, benign    Chronic.  Blood pressure slightly above goal today in office.  Goal for this patient less than 130/80 given history of diabetes.  Encouraged patient to check blood pressure at home and notify us if blood pressure consistently greater than 130/80.  Can consider adding in hydrochlorothiazide if remains elevated.  We will check kidney function and electrolytes today.  Follow-up in 3  months.         Endocrine   Diabetes mellitus with neurological manifestation (HCC)    Chronic.  A1c checked today.  Reports taking medications consistently and blood sugar meter today shows average glucose less than 150.  Reports eye exam is up-to-date-records have been requested.  Foot exam today is normal.  Plan to continue Jardiance 10 mg daily, Levemir 38 units twice daily, and Humalog 12 units 3 times daily with meals.-goal for A1c remains less than 8%.  Follow-up 3 months.         Other   Hyperlipidemia    Chronic.  Last lipid panel showed elevated LDL above 70, however patient was not fasting.  We will   check lipids today-patient is fasting.  Maintained on Crestor 40 mg daily for now.  Follow-up 3 months.         Follow up plan: Return for 3 months HTN/HLD/DM.

## 2021-02-22 NOTE — Assessment & Plan Note (Signed)
Chronic.  Last lipid panel showed elevated LDL above 70, however patient was not fasting.  We will check lipids today-patient is fasting.  Maintained on Crestor 40 mg daily for now.  Follow-up 3 months.

## 2021-02-23 LAB — COMPLETE METABOLIC PANEL WITH GFR
AG Ratio: 1.1 (calc) (ref 1.0–2.5)
ALT: 13 U/L (ref 6–29)
AST: 15 U/L (ref 10–35)
Albumin: 4.2 g/dL (ref 3.6–5.1)
Alkaline phosphatase (APISO): 68 U/L (ref 37–153)
BUN: 15 mg/dL (ref 7–25)
CO2: 25 mmol/L (ref 20–32)
Calcium: 9.8 mg/dL (ref 8.6–10.4)
Chloride: 103 mmol/L (ref 98–110)
Creat: 0.91 mg/dL (ref 0.60–0.93)
GFR, Est African American: 73 mL/min/{1.73_m2} (ref >=60)
GFR, Est Non African American: 63 mL/min/{1.73_m2} (ref >=60)
Globulin: 3.7 g/dL (calc) (ref 1.9–3.7)
Glucose, Bld: 141 mg/dL — ABNORMAL HIGH (ref 65–99)
Potassium: 4.2 mmol/L (ref 3.5–5.3)
Sodium: 138 mmol/L (ref 135–146)
Total Bilirubin: 0.4 mg/dL (ref 0.2–1.2)
Total Protein: 7.9 g/dL (ref 6.1–8.1)

## 2021-02-23 LAB — LIPID PANEL
Cholesterol: 285 mg/dL — ABNORMAL HIGH (ref <200)
HDL: 61 mg/dL (ref >=50)
LDL Cholesterol (Calc): 198 mg/dL (calc) — ABNORMAL HIGH
Non-HDL Cholesterol (Calc): 224 mg/dL (calc) — ABNORMAL HIGH (ref <130)
Total CHOL/HDL Ratio: 4.7 (calc) (ref <5.0)
Triglycerides: 121 mg/dL (ref <150)

## 2021-02-23 LAB — HEMOGLOBIN A1C
Hgb A1c MFr Bld: 8.4 % of total Hgb — ABNORMAL HIGH (ref <5.7)
Mean Plasma Glucose: 194 mg/dL
eAG (mmol/L): 10.8 mmol/L

## 2021-02-25 ENCOUNTER — Other Ambulatory Visit: Payer: Self-pay | Admitting: Family Medicine

## 2021-02-25 DIAGNOSIS — E1143 Type 2 diabetes mellitus with diabetic autonomic (poly)neuropathy: Secondary | ICD-10-CM

## 2021-02-27 MED ORDER — EZETIMIBE 10 MG PO TABS: 10.0000 mg | ORAL_TABLET | Freq: Every day | ORAL | 3 refills | Status: DC

## 2021-02-27 NOTE — Progress Notes (Signed)
Spoke with pt, she was fasting for the lab draw and she is also taking the crestor 40mg  daily. She has agreed to the zetia 10mg  dialy. Medication has been sent to pharmacy

## 2021-02-27 NOTE — Addendum Note (Signed)
Addended by: Amalia Hailey on: 02/27/2021 03:55 PM   Modules accepted: Orders

## 2021-03-15 ENCOUNTER — Telehealth: Payer: Self-pay | Admitting: Pharmacist

## 2021-03-15 ENCOUNTER — Other Ambulatory Visit: Payer: Self-pay | Admitting: Family Medicine

## 2021-03-15 DIAGNOSIS — Z794 Long term (current) use of insulin: Secondary | ICD-10-CM

## 2021-03-15 DIAGNOSIS — E1143 Type 2 diabetes mellitus with diabetic autonomic (poly)neuropathy: Secondary | ICD-10-CM

## 2021-03-15 NOTE — Progress Notes (Addendum)
Chronic Care Management Pharmacy Assistant   Name: Sue Wood  MRN: [id number] DOB: 1946/08/30   Reason for Encounter: Disease State For HTN.    Conditions to be addressed/monitored: HTN, Diabetes, HLD, osteoporosis, osteoarthritis.  Recent office visits:  02/22/21 Eulogio Bear, NP. For follow-up. STARTED Ezetimibe 10 mg daily.   Recent consult visits:  None since 02/21/21  Hospital visits:  None since 02/21/21  Medications: Outpatient Encounter Medications as of 03/15/2021  Medication Sig   Alcohol Swabs 70 % PADS Use 1-4 times daily prn   amitriptyline (ELAVIL) 50 MG tablet TAKE 1 TABLET BY MOUTH AT  BEDTIME   amLODipine (NORVASC) 10 MG tablet TAKE 1 TABLET BY MOUTH  DAILY   aspirin 81 MG tablet Take 1 tablet (81 mg total) by mouth at bedtime.   Blood Glucose Monitoring Suppl (BLOOD GLUCOSE SYSTEM PAK) KIT Please dispense based on patient and insurance preference. Use as directed to monitor FSBS 3x daily. Dx: E11.9.   empagliflozin (JARDIANCE) 10 MG TABS tablet Take 1 tablet (10 mg total) by mouth daily before breakfast.   ezetimibe (ZETIA) 10 MG tablet Take 1 tablet (10 mg total) by mouth daily.   HUMALOG KWIKPEN 100 UNIT/ML KwikPen INJECT 12 UNITS INTO THE SKIN WITH EACH MEAL.   Lancets MISC Please dispense based on patient and insurance preference. Use as directed to monitor FSBS 3x daily. Dx: E11.9.   LEVEMIR FLEXTOUCH 100 UNIT/ML FlexPen INJECT 36 UNITS SUBCUTANEOUSLY TWICE DAILY   ONETOUCH ULTRA test strip USE TO MONITOR FASTING  BLOOD SUGAR 3 TIMES DAILY  AS DIRECTED   rosuvastatin (CRESTOR) 40 MG tablet Take 1 tablet (40 mg total) by mouth daily.   triamcinolone cream (KENALOG) 0.1 % Apply 1 application topically 2 (two) times daily.   No facility-administered encounter medications on file as of 03/15/2021.   Reviewed chart prior to disease state call. Spoke with patient regarding BP  Recent Office Vitals: BP Readings from Last 3 Encounters:  02/22/21  138/82  11/12/20 132/76  08/15/20 122/60   Pulse Readings from Last 3 Encounters:  02/22/21 88  11/12/20 95  08/15/20 90    Wt Readings from Last 3 Encounters:  02/22/21 215 lb (97.5 kg)  11/12/20 218 lb 12.8 oz (99.2 kg)  08/15/20 215 lb (97.5 kg)     Kidney Function Lab Results  Component Value Date/Time   CREATININE 0.91 02/22/2021 11:35 AM   CREATININE 0.86 11/12/2020 08:25 AM   GFRNONAA 63 02/22/2021 11:35 AM   GFRAA 73 02/22/2021 11:35 AM    BMP Latest Ref Rng & Units 02/22/2021 11/12/2020 08/15/2020  Glucose 65 - 99 mg/dL 141(H) 118(H) 183(H)  BUN 7 - 25 mg/dL _0 Creatinine 0.60 - 0.93 mg/dL 0.91 0.86 0.85  BUN/Creat Ratio 6 - 22 (calc) NOT APPLICABLE NOT APPLICABLE NOT APPLICABLE  Sodium 889 - 146 mmol/L 138 139 139  Potassium 3.5 - 5.3 mmol/L 4.2 4.4 4.1  Chloride 98 - 110 mmol/L 103 104 104  CO2 20 - 32 mmol/L _1 Calcium 8.6 - 10.4 mg/dL 9.8 9.7 9.5    Current antihypertensive regimen:  Amlodipine 58m daily   How often are you checking your Blood Pressure? Patient stated she does not check her blood pressure because she does not know how to work her blood pressure machine. I explained her a step by step process on how to work the machine she voiced understanding.   Current home BP readings: N/A  What  recent interventions/DTPs have been made by any provider to improve Blood Pressure control since last CPP Visit: None.  Any recent hospitalizations or ED visits since last visit with CPP? Patient stated no.  What diet changes have been made to improve Blood Pressure Control?  Patient stated her diet has not changed any.   What exercise is being done to improve your Blood Pressure Control?  Patient stated she is active around the house daily.   Adherence Review: Is the patient currently on ACE/ARB medication? N/A  Does the patient have >5 day gap between last estimated fill dates? N/A  Star Rating Drugs:  Rosuvastatin 40 mg 90 DS 11/24/20.  Empagliflozin 10 mg 90 DS 09/02/20.  We discussed her new DM medication Ezetimibe 10 mg daily she stated since she started the medication she has not had any bad side effects from the medication and its been doing good.   Follow-Up:Pharmacist Review   Charlann Lange, RMA Clinical Pharmacist Assistant [phone]  10 minutes spent in review, coordination, and documentation.  Reviewed by: Beverly Milch, PharmD Clinical Pharmacist [facility] [phone]

## 2021-04-04 ENCOUNTER — Other Ambulatory Visit: Payer: Self-pay | Admitting: Nurse Practitioner

## 2021-04-04 DIAGNOSIS — E1143 Type 2 diabetes mellitus with diabetic autonomic (poly)neuropathy: Secondary | ICD-10-CM

## 2021-04-09 ENCOUNTER — Telehealth: Payer: Self-pay | Admitting: Pharmacist

## 2021-04-09 NOTE — Progress Notes (Addendum)
Chronic Care Management Pharmacy Assistant   Name: Sue Wood  MRN: [id number] DOB: April 19, 1946  Reason for Encounter: Disease State For DM   Conditions to be addressed/monitored: HTN, Diabetes, HLD, osteoporosis, osteoarthritis.  Recent office visits:  None since 03/15/21  Recent consult visits:  None since 03/15/21  Hospital visits:  None since 03/15/21  Medications: Outpatient Encounter Medications as of 04/09/2021  Medication Sig   Alcohol Swabs 70 % PADS Use 1-4 times daily prn   amitriptyline (ELAVIL) 50 MG tablet TAKE 1 TABLET BY MOUTH AT  BEDTIME   amLODipine (NORVASC) 10 MG tablet TAKE 1 TABLET BY MOUTH  DAILY   aspirin 81 MG tablet Take 1 tablet (81 mg total) by mouth at bedtime.   Blood Glucose Monitoring Suppl (BLOOD GLUCOSE SYSTEM PAK) KIT Please dispense based on patient and insurance preference. Use as directed to monitor FSBS 3x daily. Dx: E11.9.   empagliflozin (JARDIANCE) 10 MG TABS tablet Take 1 tablet (10 mg total) by mouth daily before breakfast.   ezetimibe (ZETIA) 10 MG tablet Take 1 tablet (10 mg total) by mouth daily.   HUMALOG KWIKPEN 100 UNIT/ML KwikPen INJECT 12 UNITS SUBCUTANEOUSLY WITH EACH MEAL   Lancets MISC Please dispense based on patient and insurance preference. Use as directed to monitor FSBS 3x daily. Dx: E11.9.   LEVEMIR FLEXTOUCH 100 UNIT/ML FlexTouch Pen INJECT 36 UNITS SUBCUTANEOUSLY TWICE DAILY   ONETOUCH ULTRA test strip USE TO MONITOR FASTING  BLOOD SUGAR 3 TIMES DAILY  AS DIRECTED   rosuvastatin (CRESTOR) 40 MG tablet Take 1 tablet (40 mg total) by mouth daily.   triamcinolone cream (KENALOG) 0.1 % Apply 1 application topically 2 (two) times daily.   No facility-administered encounter medications on file as of 04/09/2021.   Recent Relevant Labs: Lab Results  Component Value Date/Time   HGBA1C 8.4 (H) 02/22/2021 11:35 AM   HGBA1C 8.0 (H) 11/12/2020 08:25 AM   HGBA1C 10.2 (H) 04/10/2014 09:48 AM   HGBA1C 9.8 (H) 12/27/2013 08:45  AM   MICROALBUR 9.4 08/15/2020 08:34 AM   MICROALBUR 10.6 09/12/2019 03:19 PM    Kidney Function Lab Results  Component Value Date/Time   CREATININE 0.91 02/22/2021 11:35 AM   CREATININE 0.86 11/12/2020 08:25 AM   GFRNONAA 63 02/22/2021 11:35 AM   GFRAA 73 02/22/2021 11:35 AM    Current antihyperglycemic regimen:  Humalog 12 units with each meal Levemir 38 units twice daily Jardiance 53m daily  What recent interventions/DTPs have been made to improve glycemic control:  None  Have there been any recent hospitalizations or ED visits since last visit with CPP? Patient stated no.  Patient reports hypoglycemic symptoms, including None  Patient reports hyperglycemic symptoms, including none  How often are you checking your blood sugar? Patient stated 3-4 times daily  What are your blood sugars ranging?  198 AM 194 AM, 76 PM 72 PM-04/08/21 183 AM 158 PM, 154 PM- 04/07/21  During the week, how often does your blood glucose drop below 70? Patient stated they have not been lower than 70 but have come really close.   Are you checking your feet daily/regularly?  Patient stated she checks her feet regularly.   Adherence Review: Is the patient currently on a STATIN medication? N/A  Is the patient currently on ACE/ARB medication? N/A  Does the patient have >5 day gap between last estimated fill dates? N/A  Star Rating Drugs: N/A  Follow-Up:Pharmacist Review   VCharlann Lange RProctorvillePharmacist Assistant [phone]  10 minutes spent in review, coordination, and documentation.  Reviewed by: Beverly Milch, PharmD Clinical Pharmacist [phone]

## 2021-04-12 ENCOUNTER — Other Ambulatory Visit: Payer: Self-pay | Admitting: *Deleted

## 2021-04-12 MED ORDER — AMLODIPINE BESYLATE 10 MG PO TABS: 10.0000 mg | ORAL_TABLET | Freq: Every day | ORAL | 3 refills | Status: DC

## 2021-04-12 MED ORDER — ROSUVASTATIN CALCIUM 40 MG PO TABS: 40.0000 mg | ORAL_TABLET | Freq: Every day | ORAL | 3 refills | Status: DC

## 2021-04-12 MED ORDER — AMITRIPTYLINE HCL 50 MG PO TABS
50.0000 mg | ORAL_TABLET | Freq: Every day | ORAL | 3 refills | Status: DC
Start: 2021-04-12 — End: 2021-11-01

## 2021-04-25 ENCOUNTER — Other Ambulatory Visit: Payer: Self-pay | Admitting: Nurse Practitioner

## 2021-04-25 DIAGNOSIS — Z794 Long term (current) use of insulin: Secondary | ICD-10-CM

## 2021-04-25 DIAGNOSIS — E1143 Type 2 diabetes mellitus with diabetic autonomic (poly)neuropathy: Secondary | ICD-10-CM

## 2021-05-16 ENCOUNTER — Other Ambulatory Visit: Payer: Self-pay | Admitting: Nurse Practitioner

## 2021-05-16 DIAGNOSIS — Z794 Long term (current) use of insulin: Secondary | ICD-10-CM

## 2021-05-22 ENCOUNTER — Other Ambulatory Visit: Payer: Self-pay | Admitting: *Deleted

## 2021-05-22 DIAGNOSIS — Z794 Long term (current) use of insulin: Secondary | ICD-10-CM

## 2021-06-06 ENCOUNTER — Other Ambulatory Visit: Payer: Self-pay | Admitting: Nurse Practitioner

## 2021-06-06 DIAGNOSIS — E1143 Type 2 diabetes mellitus with diabetic autonomic (poly)neuropathy: Secondary | ICD-10-CM

## 2021-06-06 DIAGNOSIS — Z794 Long term (current) use of insulin: Secondary | ICD-10-CM

## 2021-06-20 NOTE — Progress Notes (Signed)
Chronic Care Management Pharmacy Note  06/28/2021 Name:  Sue Wood MRN:  [id number] DOB:  September 08, 1946  Subjective: Sue Wood is an 75 y.o. year old female who is a primary patient of Eulogio Bear, NP.  The CCM team was consulted for assistance with disease management and care coordination needs.    Engaged with patient by telephone for follow up visit in response to provider referral for pharmacy case management and/or care coordination services.   Consent to Services:  The patient was given the following information about Chronic Care Management services today, agreed to services, and gave verbal consent: 1. CCM service includes personalized support from designated clinical staff supervised by the primary care provider, including individualized plan of care and coordination with other care providers 2. 24/7 contact phone numbers for assistance for urgent and routine care needs. 3. Service will only be billed when office clinical staff spend 20 minutes or more in a month to coordinate care. 4. Only one practitioner may furnish and bill the service in a calendar month. 5.The patient may stop CCM services at any time (effective at the end of the month) by phone call to the office staff. 6. The patient will be responsible for cost sharing (co-pay) of up to 20% of the service fee (after annual deductible is met). Patient agreed to services and consent obtained.  Patient Care Team: Eulogio Bear, NP as PCP - General (Nurse Practitioner) Edythe Clarity, [facility] as Pharmacist (Pharmacist)  Recent office visits: None since last CCM visit  Recent consult visits: None since last CCM visit  Hospital visits: None in previous 6 months  Objective:  Lab Results  Component Value Date   CREATININE 0.91 02/22/2021   BUN 15 02/22/2021   GFRNONAA 63 02/22/2021   GFRAA 73 02/22/2021   NA 138 02/22/2021   K 4.2 02/22/2021   CALCIUM 9.8 02/22/2021   CO2 25 02/22/2021     Lab Results  Component Value Date/Time   HGBA1C 8.4 (H) 02/22/2021 11:35 AM   HGBA1C 8.0 (H) 11/12/2020 08:25 AM   HGBA1C 10.2 (H) 04/10/2014 09:48 AM   HGBA1C 9.8 (H) 12/27/2013 08:45 AM   MICROALBUR 9.4 08/15/2020 08:34 AM   MICROALBUR 10.6 09/12/2019 03:19 PM    Last diabetic Eye exam:  Lab Results  Component Value Date/Time   HMDIABEYEEXA No Retinopathy 06/24/2018 12:00 AM    Last diabetic Foot exam: No results found for: HMDIABFOOTEX   Lab Results  Component Value Date   CHOL 285 (H) 02/22/2021   HDL 61 02/22/2021   LDLCALC 198 (H) 02/22/2021   TRIG 121 02/22/2021   CHOLHDL 4.7 02/22/2021    Hepatic Function Latest Ref Rng & Units 02/22/2021 08/15/2020 03/20/2020  Total Protein 6.1 - 8.1 g/dL 7.9 7.5 7.4  Albumin 3.6 - 5.1 g/dL - - -  AST 10 - 35 U/L $Remo'15 17 15  'JWodz$ ALT 6 - 29 U/L $Remo'13 15 13  'LkXAa$ Alk Phosphatase 33 - 130 U/L - - -  Total Bilirubin 0.2 - 1.2 mg/dL 0.4 0.3 0.4    No results found for: TSH, FREET4  CBC Latest Ref Rng & Units 08/15/2020 09/12/2019 12/10/2018  WBC 3.8 - 10.8 Thousand/uL 5.2 5.7 4.4  Hemoglobin 11.7 - 15.5 g/dL 13.1 13.1 13.0  Hematocrit 35.0 - 45.0 % 38.6 39.8 38.3  Platelets 140 - 400 Thousand/uL 247 256 274    No results found for: VD25OH  Clinical ASCVD: No  The 10-year ASCVD risk  score (Arnett DK, et al., 2019) is: 44.3%   Values used to calculate the score:     Age: 75 years     Sex: Female     Is Non-Hispanic African American: Yes     Diabetic: Yes     Tobacco smoker: No     Systolic Blood Pressure: 517 mmHg     Is BP treated: Yes     HDL Cholesterol: 61 mg/dL     Total Cholesterol: 285 mg/dL    Depression screen [facility] 2/9 08/15/2020 10/18/2019 09/12/2019  Decreased Interest 0 0 0  Down, Depressed, Hopeless 0 0 0  PHQ - 2 Score 0 0 0  Altered sleeping - - -  Tired, decreased energy - - -  Change in appetite - - -  Feeling bad or failure about yourself  - - -  Trouble concentrating - - -  Moving slowly or fidgety/restless - - -   Suicidal thoughts - - -  PHQ-9 Score - - -  Difficult doing work/chores - - -     Social History   Tobacco Use  Smoking Status Former   Packs/day: 0.25   Years: 18.00   Pack years: 4.50   Types: Cigarettes  Smokeless Tobacco Never  Tobacco Comments   quit 1980's   BP Readings from Last 3 Encounters:  02/22/21 138/82  11/12/20 132/76  08/15/20 122/60   Pulse Readings from Last 3 Encounters:  02/22/21 88  11/12/20 95  08/15/20 90   Wt Readings from Last 3 Encounters:  02/22/21 215 lb (97.5 kg)  11/12/20 218 lb 12.8 oz (99.2 kg)  08/15/20 215 lb (97.5 kg)    Assessment/Interventions: Review of patient past medical history, allergies, medications, health status, including review of consultants reports, laboratory and other test data, was performed as part of comprehensive evaluation and provision of chronic care management services.   SDOH:  (Social Determinants of Health) assessments and interventions performed: Yes  Financial Resource Strain: Not on file    CCM Care Plan  Allergies  Allergen Reactions   Lisinopril Swelling    Lip swelling     Medications Reviewed Today     Reviewed by Edythe Clarity, [facility] (Pharmacist) on 06/28/21 at [place]  Med List Status: <None>   Medication Order Taking? Sig Documenting Provider Last Dose Status Informant  Alcohol Swabs 70 % PADS [id number] Yes Use 1-4 times daily prn Buelah Manis, Modena Nunnery, MD Taking Active   amitriptyline (ELAVIL) 50 MG tablet [id number] Yes Take 1 tablet (50 mg total) by mouth at bedtime. Eulogio Bear, NP Taking Active   amLODipine (NORVASC) 10 MG tablet [id number] Yes Take 1 tablet (10 mg total) by mouth daily. Eulogio Bear, NP Taking Active   aspirin 81 MG tablet [id number] Yes Take 1 tablet (81 mg total) by mouth at bedtime. [place], Modena Nunnery, MD Taking Active   Blood Glucose Monitoring Suppl (BLOOD GLUCOSE SYSTEM PAK) KIT [id number] Yes Please dispense based on patient and insurance preference.  Use as directed to monitor FSBS 3x daily. Dx: E11.9. [place], Modena Nunnery, MD Taking Active   empagliflozin (JARDIANCE) 10 MG TABS tablet [id number] Yes Take 1 tablet (10 mg total) by mouth daily before breakfast. Alycia Rossetti, MD Taking Active   ezetimibe (ZETIA) 10 MG tablet [id number] Yes Take 1 tablet (10 mg total) by mouth daily. Eulogio Bear, NP Taking Active   HUMALOG KWIKPEN 100 UNIT/ML KwikPen [id number] Yes INJECT [place] WITH [facility] Noemi Chapel  A, NP Taking Active   Lancets MISC [id number] Yes Please dispense based on patient and insurance preference. Use as directed to monitor FSBS 3x daily. Dx: E11.9. Alycia Rossetti, MD Taking Active   LEVEMIR [facility] 100 UNIT/ML FlexTouch Pen [id number] Yes INJECT 36 UNITS SUBCUTANEOUSLY TWICE DAILY Eulogio Bear, NP Taking Active   [facility] ULTRA test strip [id number] Yes USE TO MONITOR FASTING  BLOOD SUGAR 3 TIMES DAILY  AS DIRECTED [place], Modena Nunnery, MD Taking Active   rosuvastatin (CRESTOR) 40 MG tablet [id number] Yes Take 1 tablet (40 mg total) by mouth daily. Eulogio Bear, NP Taking Active   triamcinolone cream (KENALOG) 0.1 % [id number] Yes Apply 1 application topically 2 (two) times daily. Alycia Rossetti, MD Taking Active             Patient Active Problem List   Diagnosis Date Noted   Osteoporosis 06/30/2017   Osteoarthritis, knee 12/16/2012   Diabetes mellitus with neurological manifestation ([place]) 12/31/2011   Essential hypertension, benign 12/31/2011   Hyperlipidemia 12/31/2011   Bilateral bunions 12/31/2011   Diabetic neuropathy ([place]) 12/31/2011   Obesity 12/31/2011    Immunization History  Administered Date(s) Administered   Fluad Quad(high Dose 65+) 09/12/2019, 08/15/2020   Influenza, High Dose Seasonal PF 06/30/2017   Influenza,inj,Quad PF,6+ Mos 06/24/2013, 05/23/2014, 05/09/2015, 08/13/2016, 05/11/2018   Influenza-Unspecified 09/12/2019   Moderna SARS-COV2 Booster  Vaccination 02/12/2021   Moderna Sars-Covid-2 Vaccination 11/19/2019, 12/21/2019, 08/15/2020   Pneumococcal Conjugate-13 02/06/2015   Pneumococcal Polysaccharide-23 04/22/2016   Tdap 08/31/2013    Conditions to be addressed/monitored:  Hypertension and Hyperlipidemia, DM  Care Plan : General Pharmacy (Adult)  Updates made by Edythe Clarity, RPH since 06/28/2021 12:00 AM     Problem: HTN, HLD, DM, Osteoporosis   Priority: High  Onset Date: 11/19/2020     Goal: Patient-Specific Goal   Start Date: 11/19/2020  Expected End Date: 05/22/2021  Recent Progress: On track  Priority: High  Note:   Current Barriers:  Unable to achieve control of cholesterol  Suboptimal therapeutic regimen for DM  Pharmacist Clinical Goal(s):  Over the next 90 days, patient will achieve adherence to monitoring guidelines and medication adherence to achieve therapeutic efficacy achieve control of cholesterol as evidenced by labs maintain control of blood sugar as evidenced by home monitoring/A1c  contact provider office for questions/concerns as evidenced notation of same in electronic health record through collaboration with PharmD and provider.   Interventions: 1:1 collaboration with Eulogio Bear, NP regarding development and update of comprehensive plan of care as evidenced by provider attestation and co-signature Inter-disciplinary care team collaboration (see longitudinal plan of care) Comprehensive medication review performed; medication list updated in electronic medical record  Hypertension (BP goal <130/80) -Controlled -Current treatment: Amlodipine 10mg  -Medications previously tried: none noted  -Current home readings: unable to assess -Current dietary habits: see DM -Current exercise habits: minimal -Denies hypotensive/hypertensive symptoms -Educated on BP goals and benefits of medications for prevention of heart attack, stroke and kidney damage; Daily salt intake goal < 2300  mg; Importance of home blood pressure monitoring; -Counseled to monitor BP at home periodically, document, and provide log at future appointments -BP acceptable in office -She denies swelling. -Recommended to continue current medication  Update 02/21/21 Not checking her BP at home She continues to deny dizziness or HA's Has upcoming visit with Janett Billow this week Continue current meds for now  Diabetes (A1c goal <8%) -Controlled -Current medications: Humalog 14 units with each meal Levemir 38 units  twice daily Jardiance 10mg  daily -Medications previously tried: metformin (nausea)  -Current home glucose readings fasting glucose: 140, 118, 157, 177 this am post prandial glucose: unknown -Denies hypoglycemic/hyperglycemic symptoms -Current meal patterns:   B - egg, sausage L - leftovers, pork chops, greens, potatoes D - baked meat, vegetables Snack - Patient has stopped eating her fruit cups with syrup! Liquids - water mostly -Current exercise: minimal -Educated on A1c and blood sugar goals; Prevention and management of hypoglycemic episodes; Benefits of routine self-monitoring of blood sugar; Diet/Exercise -Counseled to check feet daily and get yearly eye exams -Recommended to continue current medication  -Recommend GFR to determine current kidney function, cannot find one in chart. -Caution for over basalization in insulin.  Would prefer to add back metformin, however patient refuses due to past experiences.  Continue regular A1c checks.  Update 02/21/21 Fasting blood sugars - 158, she did not have the rest of her logs with her Have asked her to bring blood sugar logs to appt with Janett Billow tomorrow Continues to improve her lifestyle/diet choices. Recommend checking A1c tomorrow.  If it remains elevated she may benefit from increasing Jardiance to 25mg  daily.  Update 06/27/21 Fasting sugars - 162, 139, 143 as of late, confirmed increased doses of her meal time insulin, She  still may benefit from increased dose of Jardiance.  Would recheck her A1c at upcoming visit and consider increased dose of Jardiance if it remains elevated.  She gets some prescriptions through the mail which is preferred but she may be a good candidate for packs if she would not mind slight copay increases.  Consider at next FU.   Hyperlipidemia: (LDL goal < 70) -Not ideally controlled -Current treatment: Rosuvastatin 40mg  Zetia 10mg  daily -Medications previously tried: none noted  -Current dietary patterns: see above -Current exercise habits: minimal -Educated on Cholesterol goals;  Benefits of statin for ASCVD risk reduction; Importance of limiting foods high in cholesterol;  -LDL improved, confirmed patient now on correct dose -Recommended to continue current medication  -Recheck lipids at 3 month follow up -Would recommend TSH to be checked to rule out secondary hyperlipidemia  Update 06/27/21 Started on Zetia at last visit with PCP.  She is now taking both Crestor and Zetia and reports 100% adherence.  Has not had lipids rechecked since addition of Zetia.  Recommend to continue to be adherent with medication, work on diet/exercise when able.  Continue current meds - recheck lipids at next CPE  Osteoporosis (Goal Prevent fractures) -Controlled -Last DEXA Scan: 11/22/19   T-Score femoral neck: -0.1  T-Score forearm radius: -0.3 -Patient is not a candidate for pharmacologic treatment -Current treatment  None noted -Medications previously tried: Fosamax -Patient had previous very good response with bisphosphonate therapy. -Recommended continue current management strategy. Patient is on drug holiday from Fosamax.   Patient Goals/Self-Care Activities Over the next 90 days, patient will:  - take medications as prescribed check glucose daily, document, and provide at future appointments check blood pressure periodically, document, and provide at future appointments Contact  providers with any hypoglycemia  Follow Up Plan: The care management team will reach out to the patient again over the next 90 days.             Medication Assistance: None required.  Patient affirms current coverage meets needs.  Patient's preferred pharmacy is:  [place] ([place]), [place] - [address] [place] [address] Phone: [phone] Fax: [place]  [place], [place] - [address] [place] [address] Phone: [phone] Fax: [phone]  [facility]  ([place]) - [place], [place] [facility] [address] [place] [address] [place] [address] Phone: [phone] Fax: [phone]  Uses pill box? No - patient prefers vials Pt endorses 100% compliance  We discussed: Benefits of medication synchronization, packaging and delivery as well as enhanced pharmacist oversight with [employer]. Patient decided to: Continue current medication management strategy  Care Plan and Follow Up Patient Decision:  Patient agrees to Care Plan and Follow-up.  Plan: The care management team will reach out to the patient again over the next 90 days.  Beverly Milch, PharmD Clinical Pharmacist [place] [phone]

## 2021-06-27 ENCOUNTER — Other Ambulatory Visit: Payer: Self-pay | Admitting: Nurse Practitioner

## 2021-06-27 ENCOUNTER — Ambulatory Visit (INDEPENDENT_AMBULATORY_CARE_PROVIDER_SITE_OTHER): Payer: Medicare Other | Admitting: Pharmacist

## 2021-06-27 DIAGNOSIS — E1143 Type 2 diabetes mellitus with diabetic autonomic (poly)neuropathy: Secondary | ICD-10-CM

## 2021-06-27 DIAGNOSIS — Z794 Long term (current) use of insulin: Secondary | ICD-10-CM

## 2021-06-27 DIAGNOSIS — E782 Mixed hyperlipidemia: Secondary | ICD-10-CM

## 2021-06-28 NOTE — Patient Instructions (Addendum)
Visit Information   Goals Addressed             This Visit's Progress    Track and Manage My Blood Pressure-Hypertension   On track    Timeframe:  Long-Range Goal Priority:  High Start Date:  11/19/20                           Expected End Date: 05/22/21                      Follow Up Date 03/07/21   - check blood pressure 3 times per week - write blood pressure results in a log or diary    Why is this important?   You won't feel high blood pressure, but it can still hurt your blood vessels.  High blood pressure can cause heart or kidney problems. It can also cause a stroke.  Making lifestyle changes like losing a little weight or eating less salt will help.  Checking your blood pressure at home and at different times of the day can help to control blood pressure.  If the doctor prescribes medicine remember to take it the way the doctor ordered.  Call the office if you cannot afford the medicine or if there are questions about it.     Notes: 02/21/21 - still not monitoring at home       Patient Care Plan: General Pharmacy (Adult)     Problem Identified: HTN, HLD, DM, Osteoporosis   Priority: High  Onset Date: 11/19/2020     Goal: Patient-Specific Goal   Start Date: 11/19/2020  Expected End Date: 05/22/2021  Recent Progress: On track  Priority: High  Note:   Current Barriers:  Unable to achieve control of cholesterol  Suboptimal therapeutic regimen for DM  Pharmacist Clinical Goal(s):  Over the next 90 days, patient will achieve adherence to monitoring guidelines and medication adherence to achieve therapeutic efficacy achieve control of cholesterol as evidenced by labs maintain control of blood sugar as evidenced by home monitoring/A1c  contact provider office for questions/concerns as evidenced notation of same in electronic health record through collaboration with PharmD and provider.   Interventions: 1:1 collaboration with Eulogio Bear, NP regarding  development and update of comprehensive plan of care as evidenced by provider attestation and co-signature Inter-disciplinary care team collaboration (see longitudinal plan of care) Comprehensive medication review performed; medication list updated in electronic medical record  Hypertension (BP goal <130/80) -Controlled -Current treatment: Amlodipine 10mg  -Medications previously tried: none noted  -Current home readings: unable to assess -Current dietary habits: see DM -Current exercise habits: minimal -Denies hypotensive/hypertensive symptoms -Educated on BP goals and benefits of medications for prevention of heart attack, stroke and kidney damage; Daily salt intake goal < 2300 mg; Importance of home blood pressure monitoring; -Counseled to monitor BP at home periodically, document, and provide log at future appointments -BP acceptable in office -She denies swelling. -Recommended to continue current medication  Update 02/21/21 Not checking her BP at home She continues to deny dizziness or HA's Has upcoming visit with Janett Billow this week Continue current meds for now  Diabetes (A1c goal <8%) -Controlled -Current medications: Humalog 14 units with each meal Levemir 38 units twice daily Jardiance 10mg  daily -Medications previously tried: metformin (nausea)  -Current home glucose readings fasting glucose: 140, 118, 157, 177 this am post prandial glucose: unknown -Denies hypoglycemic/hyperglycemic symptoms -Current meal patterns:   B - egg, sausage L -  leftovers, pork chops, greens, potatoes D - baked meat, vegetables Snack - Patient has stopped eating her fruit cups with syrup! Liquids - water mostly -Current exercise: minimal -Educated on A1c and blood sugar goals; Prevention and management of hypoglycemic episodes; Benefits of routine self-monitoring of blood sugar; Diet/Exercise -Counseled to check feet daily and get yearly eye exams -Recommended to continue current  medication  -Recommend GFR to determine current kidney function, cannot find one in chart. -Caution for over basalization in insulin.  Would prefer to add back metformin, however patient refuses due to past experiences.  Continue regular A1c checks.  Update 02/21/21 Fasting blood sugars - 158, she did not have the rest of her logs with her Have asked her to bring blood sugar logs to appt with Janett Billow tomorrow Continues to improve her lifestyle/diet choices. Recommend checking A1c tomorrow.  If it remains elevated she may benefit from increasing Jardiance to 25mg  daily.  Update 06/27/21 Fasting sugars - 162, 139, 143 as of late, confirmed increased doses of her meal time insulin, She still may benefit from increased dose of Jardiance.  Would recheck her A1c at upcoming visit and consider increased dose of Jardiance if it remains elevated.  She gets some prescriptions through the mail which is preferred but she may be a good candidate for packs if she would not mind slight copay increases.  Consider at next FU.   Hyperlipidemia: (LDL goal < 70) -Not ideally controlled -Current treatment: Rosuvastatin 40mg  Zetia 10mg  daily -Medications previously tried: none noted  -Current dietary patterns: see above -Current exercise habits: minimal -Educated on Cholesterol goals;  Benefits of statin for ASCVD risk reduction; Importance of limiting foods high in cholesterol;  -LDL improved, confirmed patient now on correct dose -Recommended to continue current medication  -Recheck lipids at 3 month follow up -Would recommend TSH to be checked to rule out secondary hyperlipidemia  Update 06/27/21 Started on Zetia at last visit with PCP.  She is now taking both Crestor and Zetia and reports 100% adherence.  Has not had lipids rechecked since addition of Zetia.  Recommend to continue to be adherent with medication, work on diet/exercise when able.  Continue current meds - recheck lipids at next  CPE  Osteoporosis (Goal Prevent fractures) -Controlled -Last DEXA Scan: 11/22/19   T-Score femoral neck: -0.1  T-Score forearm radius: -0.3 -Patient is not a candidate for pharmacologic treatment -Current treatment  None noted -Medications previously tried: Fosamax -Patient had previous very good response with bisphosphonate therapy. -Recommended continue current management strategy. Patient is on drug holiday from Fosamax.   Patient Goals/Self-Care Activities Over the next 90 days, patient will:  - take medications as prescribed check glucose daily, document, and provide at future appointments check blood pressure periodically, document, and provide at future appointments Contact providers with any hypoglycemia  Follow Up Plan: The care management team will reach out to the patient again over the next 90 days.           Patient verbalizes understanding of instructions provided today and agrees to view in [place].  Telephone follow up appointment with pharmacy team member scheduled for: 4 months  Edythe Clarity, [place]

## 2021-07-04 ENCOUNTER — Ambulatory Visit: Payer: Medicare Other | Admitting: Nurse Practitioner

## 2021-07-08 ENCOUNTER — Ambulatory Visit (INDEPENDENT_AMBULATORY_CARE_PROVIDER_SITE_OTHER): Payer: Medicare Other | Admitting: Nurse Practitioner

## 2021-07-08 ENCOUNTER — Other Ambulatory Visit: Payer: Self-pay

## 2021-07-08 ENCOUNTER — Encounter: Payer: Self-pay | Admitting: Nurse Practitioner

## 2021-07-08 VITALS — BP 148/78 | HR 91 | Temp 99.3°F | Resp 18 | Ht 67.0 in | Wt 224.0 lb

## 2021-07-08 DIAGNOSIS — Z794 Long term (current) use of insulin: Secondary | ICD-10-CM | POA: Diagnosis not present

## 2021-07-08 DIAGNOSIS — H9192 Unspecified hearing loss, left ear: Secondary | ICD-10-CM

## 2021-07-08 DIAGNOSIS — E782 Mixed hyperlipidemia: Secondary | ICD-10-CM

## 2021-07-08 DIAGNOSIS — R399 Unspecified symptoms and signs involving the genitourinary system: Secondary | ICD-10-CM

## 2021-07-08 DIAGNOSIS — E1143 Type 2 diabetes mellitus with diabetic autonomic (poly)neuropathy: Secondary | ICD-10-CM | POA: Diagnosis not present

## 2021-07-08 DIAGNOSIS — N3000 Acute cystitis without hematuria: Secondary | ICD-10-CM

## 2021-07-08 DIAGNOSIS — E1141 Type 2 diabetes mellitus with diabetic mononeuropathy: Secondary | ICD-10-CM | POA: Diagnosis not present

## 2021-07-08 DIAGNOSIS — I10 Essential (primary) hypertension: Secondary | ICD-10-CM

## 2021-07-08 NOTE — Progress Notes (Signed)
Subjective:    Patient ID: Sue Wood, female    DOB: 12-Aug-1946, 75 y.o.   MRN: [id number]  HPI: Sue Wood is a 75 y.o. female presenting for follow up.  Also has concerns of roaring in left ear and foul smelling urine.  Chief Complaint  Patient presents with   Diabetes   Hyperlipidemia   Ear Problem    Left, x2 weeks, reports "roaring sound", no pain   Dysuria    x27mo  HYPERTENSION / HYPERLIPIDEMIA Patient is currently taking amlodipine 10 mg daily for hypertension.  Takes rosuvastatin 40 mg daily for high cholesterol.  Does take daily baby aspirin.  Does not regularly check her blood pressure at home.  She is tolerating these medications well. Aspirin: no Recent stressors: no Recurrent headaches: no Visual changes: no Palpitations: no Dyspnea: no Chest pain: no Lower extremity edema: no Dizzy/lightheaded: no Myalgias: no The 10-year ASCVD risk score (Arnett DK, et al., 2019) is: 36.2%   Values used to calculate the score:     Age: [age]years     Sex: Female     Is Non-Hispanic African American: Yes     Diabetic: Yes     Tobacco smoker: No     Systolic Blood Pressure: 1545mmHg     Is BP treated: Yes     HDL Cholesterol: 62 mg/dL     Total Cholesterol: 189 mg/dL  DIABETES Currently taking Jardiance 10 mg daily along with short and long acting insulin for diabetes.  She takes daily baby aspirin and high intensity statin.   A1c goal is less than 8%. Hypoglycemic episodes:no Polydipsia/polyuria: no Visual disturbance: no Chest pain: no Paresthesias: no Glucose Monitoring: no  Accucheck frequency: three times daily  Fasting glucose: 160-210  Post prandial:  Evening:  Before meals: 130s-220s Taking Insulin?: yes  Long acting insulin: 38 units twice daily   Short acting insulin: 14 units at breakfast, lunch, supper Blood Pressure Monitoring: rarely Normal BP: 130s/70s Retinal Examination: Not up to Date Foot Exam: Up to Date Diabetic  Education: Completed Pneumovax: Up to Date Influenza: Not up to Date Aspirin: no  EAR PAIN Duration: weeks Involved ear(s): left Severity:  no pain  Quality:  echo; heaviness Fever: no Otorrhea: no Upper respiratory infection symptoms: no Pruritus: no Hearing loss: yes; chronic, requesting referral to Ear doctor Water immersion no Using Q-tips: no Recurrent otitis media: no Status: stable Treatments attempted: none  URINARY SYMPTOMS Patient reports changes in urination.  She reports her urine smells worse than normal.  She reports itching in her perineum at the end of urination. Duration: days Dysuria: no; is itchy afterward Urinary frequency: no Urgency: no Small volume voids: yes Symptom severity: mild Urinary incontinence: no Foul odor: yes Hematuria: no Abdominal pain: LLQ pain occasionally Back pain: no Suprapubic pain/pressure: no Flank pain: no Fever:  no Vomiting: no Nausea: no Relief with cranberry juice: no Relief with pyridium: no Status: stable Previous urinary tract infection: no Recurrent urinary tract infection: no Vaginal discharge: no Treatments attempted: nothing   Allergies  Allergen Reactions   Lisinopril Swelling    Lip swelling     Outpatient Encounter Medications as of 07/08/2021  Medication Sig   amitriptyline (ELAVIL) 50 MG tablet Take 1 tablet (50 mg total) by mouth at bedtime.   amLODipine (NORVASC) 10 MG tablet Take 1 tablet (10 mg total) by mouth daily.   aspirin 81 MG tablet Take 1 tablet (81 mg total) by  mouth at bedtime.   empagliflozin (JARDIANCE) 10 MG TABS tablet Take 1 tablet (10 mg total) by mouth daily before breakfast.   ezetimibe (ZETIA) 10 MG tablet Take 1 tablet (10 mg total) by mouth daily.   HUMALOG KWIKPEN 100 UNIT/ML KwikPen INJECT 12 UNITS SUBCUTANEOUSLY WITH EACH MEAL   LEVEMIR FLEXTOUCH 100 UNIT/ML FlexTouch Pen INJECT 36 UNITS SUBCUTANEOUSLY TWICE DAILY   rosuvastatin (CRESTOR) 40 MG tablet Take 1 tablet  (40 mg total) by mouth daily.   Alcohol Swabs 70 % PADS Use 1-4 times daily prn   Blood Glucose Monitoring Suppl (BLOOD GLUCOSE SYSTEM PAK) KIT Please dispense based on patient and insurance preference. Use as directed to monitor FSBS 3x daily. Dx: E11.9.   Lancets MISC Please dispense based on patient and insurance preference. Use as directed to monitor FSBS 3x daily. Dx: E11.9.   ONETOUCH ULTRA test strip USE TO MONITOR FASTING  BLOOD SUGAR 3 TIMES DAILY  AS DIRECTED   triamcinolone cream (KENALOG) 0.1 % Apply 1 application topically 2 (two) times daily.   No facility-administered encounter medications on file as of 07/08/2021.    Patient Active Problem List   Diagnosis Date Noted   Osteoporosis 06/30/2017   Osteoarthritis, knee 12/16/2012   Diabetes mellitus with neurological manifestation ([place]) 12/31/2011   Essential hypertension, benign 12/31/2011   Hyperlipidemia 12/31/2011   Bilateral bunions 12/31/2011   Diabetic neuropathy ([place]) 12/31/2011   Obesity 12/31/2011    Past Medical History:  Diagnosis Date   Arthritis    Diabetes mellitus    Hyperlipidemia    Hypertension    Peripheral neuropathy     Relevant past medical, surgical, family and social history reviewed and updated as indicated. Interim medical history since our last visit reviewed.  Review of Systems Per HPI unless specifically indicated above     Objective:    BP (!) 148/78 (BP Location: Left Arm, Patient Position: Sitting)   Pulse 91   Temp 99.3 F (37.4 C) (Oral)   Resp 18   Ht 5' 7" (1.702 m)   Wt 224 lb (101.6 kg)   SpO2 96%   BMI 35.08 kg/m   Wt Readings from Last 3 Encounters:  07/08/21 224 lb (101.6 kg)  02/22/21 215 lb (97.5 kg)  11/12/20 218 lb 12.8 oz (99.2 kg)    Physical Exam Vitals and nursing note reviewed.  Constitutional:      General: She is not in acute distress.    Appearance: Normal appearance. She is not toxic-appearing.  HENT:     Head: Normocephalic and atraumatic.   Eyes:     General: No scleral icterus.       Right eye: No discharge.        Left eye: No discharge.     Extraocular Movements: Extraocular movements intact.     Pupils: Pupils are equal, round, and reactive to light.  Neck:     Vascular: No carotid bruit.  Cardiovascular:     Rate and Rhythm: Normal rate and regular rhythm.     Heart sounds: Normal heart sounds. No murmur heard. Pulmonary:     Effort: Pulmonary effort is normal. No respiratory distress.     Breath sounds: Normal breath sounds. No wheezing, rhonchi or rales.  Abdominal:     General: Abdomen is flat. Bowel sounds are normal. There is no distension.     Palpations: Abdomen is soft. There is no mass.     Tenderness: There is abdominal tenderness.  Comments: Points to the area marked above with occasional tenderness  Musculoskeletal:     Cervical back: Normal range of motion.     Right lower leg: No edema.     Left lower leg: No edema.  Skin:    General: Skin is warm and dry.     Capillary Refill: Capillary refill takes less than 2 seconds.     Coloration: Skin is not jaundiced or pale.     Findings: No erythema.  Neurological:     General: No focal deficit present.     Mental Status: She is alert and oriented to person, place, and time.     Motor: No weakness.     Gait: Gait normal.  Psychiatric:        Mood and Affect: Mood normal.        Behavior: Behavior normal.        Thought Content: Thought content normal.        Judgment: Judgment normal.      Assessment & Plan:   Problem List Items Addressed This Visit       Cardiovascular and Mediastinum   Essential hypertension, benign    Chronic.  BP elevated above goal today in office.  However, it did come down a couple of points upon recheck.  Goal is less than 140/90.  I have asked the patient to check her blood pressure at home a few times per week.  If her blood pressure is consistently greater than 140/90, we will need to add additional  medication.  For now, we will continue amlodipine 10 mg daily.  Check kidney function with electrolytes today.  Follow-up in 3 months.        Endocrine   Diabetic neuropathy (HCC)    Chronic.  Suspect related to hyperglycemia from diabetes.  Foot exam is up-to-date.  Currently taking amitriptyline 50 mg nightly and reports good control of symptoms.  Discuss trial of 25 mg in the future -high risk medication for her age.  Can also consider gabapentin in the future if neuropathy worsens.  Follow-up 3 months.      Diabetes mellitus with neurological manifestation (HCC) - Primary    Chronic.  Will check A1c today.  Currently taking Jardiance 10 mg daily, Levemir 38 units twice daily, and Humalog 14 units with meals.  We will plan to continue these medications as prescribed for now.  Can consider increasing Jardiance to 25 mg daily.  Foot exam is up-to-date.  Eye exam is up-to-date and records have been requested- patient desires new ophthalmologist, will place referral today.  A1c goal is less than 8% given age and comorbidities.  Follow-up 3 months.      Relevant Orders   Hemoglobin A1c (Completed)   COMPLETE METABOLIC PANEL WITH GFR (Completed)   Ambulatory referral to Ophthalmology     Other   Hyperlipidemia    Chronic.  LDL goal is less than 70 given history of diabetes.  Patient is fasting today-check lipids.  Plan to continue Crestor 40 mg daily and Zetia 10 mg daily pending cholesterol levels.  Follow-up 3 months.      Relevant Orders   Lipid panel (Completed)   COMPLETE METABOLIC PANEL WITH GFR (Completed)   Other Visit Diagnoses     UTI symptoms       Check urinalysis today, will also send for culture given symptoms.  Can also consider yeast vaginitis -takes Jardiance.  Follow-up pending results.   Relevant Orders   Urine Culture  Urinalysis, Routine w reflex microscopic (Completed)   Hearing loss of left ear, unspecified hearing loss type       Bilateral ear exam today  normal.  Discussed use of Flonase to help with ?eustachian tube dysfunction.  Referral placed to ENT per patient request.   Relevant Orders   Ambulatory referral to ENT        Follow up plan: Return in about 3 months (around 10/08/2021) for DM, HLD, HTN follow up.

## 2021-07-09 LAB — LIPID PANEL
Cholesterol: 189 mg/dL (ref <200)
HDL: 62 mg/dL (ref >=50)
LDL Cholesterol (Calc): 108 mg/dL (calc) — ABNORMAL HIGH
Non-HDL Cholesterol (Calc): 127 mg/dL (calc) (ref <130)
Total CHOL/HDL Ratio: 3 (calc) (ref <5.0)
Triglycerides: 101 mg/dL (ref <150)

## 2021-07-09 LAB — URINALYSIS, ROUTINE W REFLEX MICROSCOPIC
Bacteria, UA: NONE SEEN /HPF
Bilirubin Urine: NEGATIVE
Glucose, UA: NEGATIVE
Hgb urine dipstick: NEGATIVE
Hyaline Cast: NONE SEEN /LPF
Ketones, ur: NEGATIVE
Nitrite: NEGATIVE
Protein, ur: NEGATIVE
RBC / HPF: NONE SEEN /HPF (ref 0–2)
Specific Gravity, Urine: 1.007 (ref 1.001–1.035)
Squamous Epithelial / HPF: NONE SEEN /HPF (ref <=5)
pH: 5.5 (ref 5.0–8.0)

## 2021-07-09 LAB — HEMOGLOBIN A1C
Hgb A1c MFr Bld: 8.2 % of total Hgb — ABNORMAL HIGH (ref <5.7)
Mean Plasma Glucose: 189 mg/dL
eAG (mmol/L): 10.4 mmol/L

## 2021-07-09 LAB — COMPLETE METABOLIC PANEL WITH GFR
AG Ratio: 1.3 (calc) (ref 1.0–2.5)
ALT: 17 U/L (ref 6–29)
AST: 19 U/L (ref 10–35)
Albumin: 4.4 g/dL (ref 3.6–5.1)
Alkaline phosphatase (APISO): 79 U/L (ref 37–153)
BUN: 12 mg/dL (ref 7–25)
CO2: 24 mmol/L (ref 20–32)
Calcium: 9.4 mg/dL (ref 8.6–10.4)
Chloride: 103 mmol/L (ref 98–110)
Creat: 0.79 mg/dL (ref 0.60–1.00)
Globulin: 3.4 g/dL (calc) (ref 1.9–3.7)
Glucose, Bld: 121 mg/dL — ABNORMAL HIGH (ref 65–99)
Potassium: 4 mmol/L (ref 3.5–5.3)
Sodium: 139 mmol/L (ref 135–146)
Total Bilirubin: 0.3 mg/dL (ref 0.2–1.2)
Total Protein: 7.8 g/dL (ref 6.1–8.1)
eGFR: 79 mL/min/{1.73_m2} (ref >=60)

## 2021-07-09 LAB — MICROSCOPIC MESSAGE

## 2021-07-09 NOTE — Assessment & Plan Note (Signed)
Chronic.  BP elevated above goal today in office.  However, it did come down a couple of points upon recheck.  Goal is less than 140/90.  I have asked the patient to check her blood pressure at home a few times per week.  If her blood pressure is consistently greater than 140/90, we will need to add additional medication.  For now, we will continue amlodipine 10 mg daily.  Check kidney function with electrolytes today.  Follow-up in 3 months.

## 2021-07-09 NOTE — Assessment & Plan Note (Signed)
Chronic.  LDL goal is less than 70 given history of diabetes.  Patient is fasting today-check lipids.  Plan to continue Crestor 40 mg daily and Zetia 10 mg daily pending cholesterol levels.  Follow-up 3 months.

## 2021-07-09 NOTE — Assessment & Plan Note (Signed)
Chronic.  Suspect related to hyperglycemia from diabetes.  Foot exam is up-to-date.  Currently taking amitriptyline 50 mg nightly and reports good control of symptoms.  Discuss trial of 25 mg in the future -high risk medication for her age.  Can also consider gabapentin in the future if neuropathy worsens.  Follow-up 3 months.

## 2021-07-09 NOTE — Assessment & Plan Note (Signed)
Chronic.  Will check A1c today.  Currently taking Jardiance 10 mg daily, Levemir 38 units twice daily, and Humalog 14 units with meals.  We will plan to continue these medications as prescribed for now.  Can consider increasing Jardiance to 25 mg daily.  Foot exam is up-to-date.  Eye exam is up-to-date and records have been requested- patient desires new ophthalmologist, will place referral today.  A1c goal is less than 8% given age and comorbidities.  Follow-up 3 months.

## 2021-07-10 LAB — URINE CULTURE
MICRO NUMBER:: 12572146
SPECIMEN QUALITY:: ADEQUATE

## 2021-07-11 MED ORDER — CEPHALEXIN 500 MG PO CAPS: 500.0000 mg | ORAL_CAPSULE | Freq: Four times a day (QID) | ORAL | 0 refills | Status: DC

## 2021-07-11 NOTE — Addendum Note (Signed)
Addended by: Noemi Chapel A on: 07/11/2021 10:14 AM   Modules accepted: Orders

## 2021-07-18 ENCOUNTER — Other Ambulatory Visit: Payer: Self-pay | Admitting: Nurse Practitioner

## 2021-07-18 DIAGNOSIS — Z794 Long term (current) use of insulin: Secondary | ICD-10-CM

## 2021-07-31 ENCOUNTER — Other Ambulatory Visit: Payer: Self-pay | Admitting: Nurse Practitioner

## 2021-07-31 DIAGNOSIS — E1143 Type 2 diabetes mellitus with diabetic autonomic (poly)neuropathy: Secondary | ICD-10-CM

## 2021-08-03 ENCOUNTER — Other Ambulatory Visit: Payer: Self-pay | Admitting: Nurse Practitioner

## 2021-08-03 DIAGNOSIS — E1143 Type 2 diabetes mellitus with diabetic autonomic (poly)neuropathy: Secondary | ICD-10-CM

## 2021-08-14 ENCOUNTER — Telehealth: Payer: Self-pay | Admitting: Pharmacist

## 2021-08-14 NOTE — Progress Notes (Addendum)
Chronic Care Management Pharmacy Assistant   Name: Sue Wood  MRN: [id number] DOB: 1946-06-05   Reason for Encounter: Disease State = Diabetes Call     Recent office visits:   07/08/21 Noemi Chapel, NP (PCP) - Family Medicine - Diabetes - Labs were ordered. Referral to Ophthalmology  and ENT placed. Cephalexin (KEFLEX) 500 MG capsule Take 1 capsule (500 mg total) by mouth every 6 (six) hours.  If her blood pressure is consistently greater than 140/90, we will need to add additional medication.  For now, we will continue amlodipine 10 mg daily. Follow up in 3 months.   Recent consult visits:  None noted.   Hospital visits:  None in previous 6 months  Medications: Outpatient Encounter Medications as of 08/14/2021  Medication Sig   Alcohol Swabs 70 % PADS Use 1-4 times daily prn   amitriptyline (ELAVIL) 50 MG tablet Take 1 tablet (50 mg total) by mouth at bedtime.   amLODipine (NORVASC) 10 MG tablet Take 1 tablet (10 mg total) by mouth daily.   aspirin 81 MG tablet Take 1 tablet (81 mg total) by mouth at bedtime.   Blood Glucose Monitoring Suppl (BLOOD GLUCOSE SYSTEM PAK) KIT Please dispense based on patient and insurance preference. Use as directed to monitor FSBS 3x daily. Dx: E11.9.   cephALEXin (KEFLEX) 500 MG capsule Take 1 capsule (500 mg total) by mouth every 6 (six) hours.   empagliflozin (JARDIANCE) 10 MG TABS tablet Take 1 tablet (10 mg total) by mouth daily before breakfast.   ezetimibe (ZETIA) 10 MG tablet Take 1 tablet (10 mg total) by mouth daily.   HUMALOG KWIKPEN 100 UNIT/ML KwikPen INJECT 12 UNITS SUBCUTANEOUSLY WITH EACH MEAL   Lancets MISC Please dispense based on patient and insurance preference. Use as directed to monitor FSBS 3x daily. Dx: E11.9.   LEVEMIR FLEXTOUCH 100 UNIT/ML FlexTouch Pen INJECT 36 UNITS SUBCUTANEOUSLY TWICE DAILY   ONETOUCH ULTRA test strip USE TO MONITOR FASTING  BLOOD SUGAR 3 TIMES DAILY  AS DIRECTED   rosuvastatin (CRESTOR)  40 MG tablet Take 1 tablet (40 mg total) by mouth daily.   triamcinolone cream (KENALOG) 0.1 % Apply 1 application topically 2 (two) times daily.   No facility-administered encounter medications on file as of 08/14/2021.    Current antihyperglycemic regimen:  Humalog 14 units with each meal Levemir 38 units twice daily Jardiance $RemoveBeforeD'10mg'BjAfvIVIoevish$  daily   What recent interventions/DTPs have been made to improve glycemic control:    Have there been any recent hospitalizations or ED visits since last visit with CPP?  Patient has not had any ED visits for hospitalizations since last visit with [facility].   Patient  hypoglycemic symptoms, including    Patient  hyperglycemic symptoms, including    How often are you checking your blood sugar?    What are your blood sugars ranging?  Fasting:  Before meals:  After meals:  Bedtime:   During the week, how often does your blood glucose drop below 70?    Are you checking your feet daily/regularly?     Adherence Review: Is the patient currently on a STATIN medication? Yes Is the patient currently on ACE/ARB medication? No Does the patient have >5 day gap between last estimated fill dates? No  Humalog 14 units with each meal - last filled 06/27/21 42 days  Levemir 38 units twice daily - last filled 07/18/21 21 days  Jardiance $RemoveBe'10mg'rvQvHLSAo$  daily - last filled 09/02/20 90 days    Care  Gaps  AWV: overdue  Colonoscopy: due 12/09/23 DM Eye Exam: due 06/25/19 (has been ordered) DM Foot Exam: due 02/22/21 Microalbumin: done 08/15/20 HbgAIC: done 07/08/21 (8.2) DEXA: done 11/22/19 Mammogram: done 11/22/19  Star Rating Drugs:  rosuvastatin (CRESTOR) 40 MG tablet - last filled 07/13/21 90 days    Future Appointments  Date Time Provider [place]  11/07/2021  3:45 PM [facility] PHARMACIST [facility] None   Multiple attempts were made to contact patient. Attempts were unsuccessful. / ls,CMA   Jobe Gibbon, [place] Pharmacist Assistant  [phone]

## 2021-08-26 ENCOUNTER — Other Ambulatory Visit: Payer: Self-pay | Admitting: Nurse Practitioner

## 2021-08-26 DIAGNOSIS — Z794 Long term (current) use of insulin: Secondary | ICD-10-CM

## 2021-10-14 ENCOUNTER — Other Ambulatory Visit: Payer: Self-pay

## 2021-10-14 MED ORDER — ONETOUCH ULTRA VI STRP: ORAL_STRIP | 3 refills | Status: DC

## 2021-10-30 ENCOUNTER — Telehealth: Payer: Self-pay

## 2021-10-30 NOTE — Telephone Encounter (Signed)
Per [facility] Elavil is not currently available.   Alternatives: Mirtazapine 15mg  Bupropion 75mg  Citalopram 20mg   Please advise, thanks!

## 2021-11-01 MED ORDER — NORTRIPTYLINE HCL 25 MG PO CAPS: 25.0000 mg | ORAL_CAPSULE | Freq: Every day | ORAL | 3 refills | Status: DC

## 2021-11-01 NOTE — Telephone Encounter (Signed)
[facility] has Nortriptyline in stock. Rx sent. Patient aware of change. Nothing further needed at this time.

## 2021-11-07 ENCOUNTER — Telehealth: Payer: Medicare Other

## 2021-11-09 ENCOUNTER — Ambulatory Visit
Admission: EM | Admit: 2021-11-09 | Discharge: 2021-11-09 | Disposition: A | Discharge status: Home / Self Care | Payer: Medicare Other | Attending: Family Medicine | Admitting: Family Medicine

## 2021-11-09 ENCOUNTER — Encounter: Payer: Self-pay | Admitting: Emergency Medicine

## 2021-11-09 ENCOUNTER — Other Ambulatory Visit: Payer: Self-pay

## 2021-11-09 ENCOUNTER — Ambulatory Visit
Admission: RE | Admit: 2021-11-09 | Discharge: 2021-11-09 | Disposition: A | Discharge status: Home / Self Care | Payer: Medicare Other | Source: Ambulatory Visit

## 2021-11-09 DIAGNOSIS — Z794 Long term (current) use of insulin: Secondary | ICD-10-CM

## 2021-11-09 DIAGNOSIS — E1143 Type 2 diabetes mellitus with diabetic autonomic (poly)neuropathy: Secondary | ICD-10-CM | POA: Diagnosis not present

## 2021-11-09 DIAGNOSIS — E1169 Type 2 diabetes mellitus with other specified complication: Secondary | ICD-10-CM

## 2021-11-09 LAB — POCT FASTING CBG KUC MANUAL ENTRY: POCT Glucose (KUC): 389 mg/dL — AB (ref 70–99)

## 2021-11-09 MED ORDER — EMPAGLIFLOZIN 10 MG PO TABS: 10.0000 mg | ORAL_TABLET | Freq: Every day | ORAL | 2 refills | Status: DC

## 2021-11-09 MED ORDER — LEVEMIR FLEXTOUCH 100 UNIT/ML ~~LOC~~ SOPN: PEN_INJECTOR | SUBCUTANEOUS | 3 refills | Status: DC

## 2021-11-09 NOTE — ED Triage Notes (Signed)
States unable to get diabetes medications.  Unable to get in to see PCP.  States blood sugar last night was 342 ?

## 2021-11-09 NOTE — ED Provider Notes (Signed)
?[facility] ? ? ? ?CSN: [id number] ?Arrival date & time: 11/09/21  1018 ? ? ?  ? ?History   ?Chief Complaint ?No chief complaint on file. ? ? ?HPI ?Sue Wood is a 76 y.o. female.  ? ?76 year old female with past medical history of diabetes.  She presents today needing refills on her Levemir and Jardiance.  Her pharmacy is out of her insulin and unable to get a prescription to get medication sent to a different pharmacy.  Blood sugars have been running high in the 300s. ? ? ? ?Past Medical History:  ?Diagnosis Date  ? Arthritis   ? Diabetes mellitus   ? Hyperlipidemia   ? Hypertension   ? Peripheral neuropathy   ? ? ?Patient Active Problem List  ? Diagnosis Date Noted  ? Osteoporosis 06/30/2017  ? Osteoarthritis, knee 12/16/2012  ? Diabetes mellitus with neurological manifestation ([place]) 12/31/2011  ? Essential hypertension, benign 12/31/2011  ? Hyperlipidemia 12/31/2011  ? Bilateral bunions 12/31/2011  ? Diabetic neuropathy ([place]) 12/31/2011  ? Obesity 12/31/2011  ? ? ?Past Surgical History:  ?Procedure Laterality Date  ? ABDOMINAL HYSTERECTOMY    ? CESAREAN SECTION    ? X 2  ? COLONOSCOPY N/A 12/08/2013  ? Procedure: COLONOSCOPY;  Surgeon: Rogene Houston, MD;  [facility];  Service: Endoscopy;  Laterality: N/A;  930  ? KNEE ARTHROSCOPY  1998  ? left  ? ? ?OB History   ?No obstetric history on file. ?  ? ? ? ?Home Medications   ? ?Prior to Admission medications   ?Medication Sig Start Date End Date Taking? Authorizing Provider  ?Alcohol Swabs 70 % PADS Use 1-4 times daily prn 02/12/16   Alycia Rossetti, MD  ?amLODipine (NORVASC) 10 MG tablet Take 1 tablet (10 mg total) by mouth daily. 04/12/21   Eulogio Bear, NP  ?aspirin 81 MG tablet Take 1 tablet (81 mg total) by mouth at bedtime. 02/12/16   Alycia Rossetti, MD  ?Blood Glucose Monitoring Suppl (BLOOD GLUCOSE SYSTEM PAK) KIT Please dispense based on patient and insurance preference. Use as directed to monitor FSBS 3x daily. Dx:  E11.9. 12/13/18   Alycia Rossetti, MD  ?cephALEXin (KEFLEX) 500 MG capsule Take 1 capsule (500 mg total) by mouth every 6 (six) hours. 07/11/21   Eulogio Bear, NP  ?empagliflozin (JARDIANCE) 10 MG TABS tablet Take 1 tablet (10 mg total) by mouth daily before breakfast. 11/09/21   Loura Halt A, NP  ?ezetimibe (ZETIA) 10 MG tablet Take 1 tablet (10 mg total) by mouth daily. 02/27/21   Eulogio Bear, NP  ?glucose blood (ONETOUCH ULTRA) test strip USE TO MONITOR FASTING  BLOOD SUGAR 3 TIMES DAILY  AS DIRECTED 10/14/21   Susy Frizzle, MD  ?HUMALOG KWIKPEN 100 UNIT/ML KwikPen INJECT 12 UNITS SUBCUTANEOUSLY WITH [facility] MEAL 07/31/21   Noemi Chapel A, NP  ?insulin detemir (LEVEMIR FLEXTOUCH) 100 UNIT/ML FlexPen 36 units in the morning and 36 units at night. 11/09/21   Orvan July, NP  ?Lancets MISC Please dispense based on patient and insurance preference. Use as directed to monitor FSBS 3x daily. Dx: E11.9. 12/13/18   Alycia Rossetti, MD  ?nortriptyline (PAMELOR) 25 MG capsule Take 1 capsule (25 mg total) by mouth at bedtime. 11/01/21   Susy Frizzle, MD  ?rosuvastatin (CRESTOR) 40 MG tablet Take 1 tablet (40 mg total) by mouth daily. 04/12/21   Eulogio Bear, NP  ?triamcinolone cream (KENALOG) 0.1 %  Apply 1 application topically 2 (two) times daily. 12/10/18   Alycia Rossetti, MD  ? ? ?Family History ?Family History  ?Problem Relation Age of Onset  ? Hypertension Mother   ? Hyperlipidemia Mother   ? Hypertension Father   ? Hyperlipidemia Father   ? Diabetes Father   ? Colon cancer Neg Hx   ? ? ?Social History ?Social History  ? ?Tobacco Use  ? Smoking status: Former  ?  Packs/day: 0.25  ?  Years: 18.00  ?  Pack years: 4.50  ?  Types: Cigarettes  ? Smokeless tobacco: Never  ? Tobacco comments:  ?  quit 1980's  ?Substance Use Topics  ? Alcohol use: No  ? Drug use: No  ? ? ? ?Allergies   ?Lisinopril ? ? ?Review of Systems ?Review of Systems ?Per HPI  ? ?Physical Exam ?Triage Vital Signs ?ED Triage  Vitals [11/09/21 1022]  ?Enc Vitals Group  ?   BP (!) 149/83  ?   Pulse Rate 100  ?   Resp 16  ?   Temp 98.4 ?F (36.9 ?C)  ?   Temp Source Oral  ?   SpO2 98 %  ?   Weight   ?   Height   ?   Head Circumference   ?   Peak Flow   ?   Pain Score 0  ?   Pain Loc   ?   Pain Edu?   ?   Excl. in [place]?   ? ?No data found. ? ?Updated Vital Signs ?BP (!) 149/83 (BP Location: Right Arm)   Pulse 100   Temp 98.4 ?F (36.9 ?C) (Oral)   Resp 16   SpO2 98%  ? ?Visual Acuity ?Right Eye Distance:   ?Left Eye Distance:   ?Bilateral Distance:   ? ?Right Eye Near:   ?Left Eye Near:    ?Bilateral Near:    ? ?Physical Exam ?Vitals and nursing note reviewed.  ?Constitutional:   ?   General: She is not in acute distress. ?   Appearance: Normal appearance. She is not ill-appearing, toxic-appearing or diaphoretic.  ?HENT:  ?   Head: Normocephalic and atraumatic.  ?Pulmonary:  ?   Effort: Pulmonary effort is normal.  ?Neurological:  ?   Mental Status: She is alert.  ?Psychiatric:     ?   Mood and Affect: Mood normal.  ? ? ? ?UC Treatments / Results  ?Labs ?(all labs ordered are listed, but only abnormal results are displayed) ?Labs Reviewed  ?POCT FASTING CBG [facility] MANUAL ENTRY - Abnormal; Notable for the following components:  ?    Result Value  ? POCT Glucose ([facility]) 389 (*)   ? All other components within normal limits  ? ? ?EKG ? ? ?Radiology ?No results found. ? ?Procedures ?Procedures (including critical care time) ? ?Medications Ordered in UC ?Medications - No data to display ? ?Initial Impression / Assessment and Plan / UC Course  ?I have reviewed the triage vital signs and the nursing notes. ? ?Pertinent labs & imaging results that were available during my care of the patient were reviewed by me and considered in my medical decision making (see chart for details). ? ?  ? ?Type 2 diabetes-medications refilled as requested and printed out prescription to take to pharmacy ?Follow-up with primary care as needed ?Final Clinical Impressions(s)  / UC Diagnoses  ? ?Final diagnoses:  ?Type 2 diabetes mellitus with other specified complication, with long-term current use of insulin (  [place])  ? ?Discharge Instructions   ?None ?  ? ?ED Prescriptions   ? ? Medication Sig Dispense Auth. Provider  ? empagliflozin (JARDIANCE) 10 MG TABS tablet Take 1 tablet (10 mg total) by mouth daily before breakfast. 30 tablet Imogine Carvell A, NP  ? insulin detemir (LEVEMIR FLEXTOUCH) 100 UNIT/ML FlexPen 36 units in the morning and 36 units at night. 15 mL Keenya Matera A, NP  ? ?  ? ?PDMP not reviewed this encounter. ?  ?Orvan July, NP ?11/09/21 1214 ? ?

## 2021-11-09 NOTE — ED Provider Notes (Deleted)
?  [facility] ? ? ? ?CSN: [id number] ?Arrival date & time: 11/09/21  4599 ? ? ?  ? ?History   ?Chief Complaint ?No chief complaint on file. ? ? ?HPI ?Sue Wood is a 76 y.o. female.  ? ?HPI ? ?No past medical history on file. ? ?There are no problems to display for this patient. ? ? The histories are not reviewed yet. Please review them in the "History" navigator section and refresh this [place]. ? ?OB History   ?No obstetric history on file. ?  ? ? ? ?Home Medications   ? ?Prior to Admission medications   ?Not on File  ? ? ?Family History ?No family history on file. ? ?Social History ?  ? ? ?Allergies   ?Patient has no allergy information on record. ? ? ?Review of Systems ?Review of Systems ? ? ?Physical Exam ?Triage Vital Signs ?ED Triage Vitals [11/09/21 1033]  ?Enc Vitals Group  ?   BP   ?   Pulse   ?   Resp   ?   Temp   ?   Temp src   ?   SpO2   ?   Weight   ?   Height   ?   Head Circumference   ?   Peak Flow   ?   Pain Score 0  ?   Pain Loc   ?   Pain Edu?   ?   Excl. in [place]?   ? ?No data found. ? ?Updated Vital Signs ?There were no vitals taken for this visit. ? ?Visual Acuity ?Right Eye Distance:   ?Left Eye Distance:   ?Bilateral Distance:   ? ?Right Eye Near:   ?Left Eye Near:    ?Bilateral Near:    ? ?Physical Exam ? ? ?UC Treatments / Results  ?Labs ?(all labs ordered are listed, but only abnormal results are displayed) ?Labs Reviewed - No data to display ? ?EKG ? ? ?Radiology ?No results found. ? ?Procedures ?Procedures (including critical care time) ? ?Medications Ordered in UC ?Medications - No data to display ? ?Initial Impression / Assessment and Plan / UC Course  ?I have reviewed the triage vital signs and the nursing notes. ? ?Pertinent labs & imaging results that were available during my care of the patient were reviewed by me and considered in my medical decision making (see chart for details). ? ?  ? ? ?Final Clinical Impressions(s) / UC Diagnoses  ? ?Final diagnoses:  ?None   ? ?Discharge Instructions   ?None ?  ? ?ED Prescriptions   ?None ?  ? ?PDMP not reviewed this encounter. ?  ?Orvan July, NP ?11/09/21 1218 ? ?

## 2021-11-09 NOTE — ED Notes (Signed)
Pt created an appt with wrong date of birth.  ?

## 2021-11-11 ENCOUNTER — Encounter: Payer: Self-pay | Admitting: Emergency Medicine

## 2022-01-09 ENCOUNTER — Other Ambulatory Visit: Payer: Self-pay | Admitting: Nurse Practitioner

## 2022-01-09 DIAGNOSIS — Z794 Long term (current) use of insulin: Secondary | ICD-10-CM

## 2022-02-20 ENCOUNTER — Ambulatory Visit (INDEPENDENT_AMBULATORY_CARE_PROVIDER_SITE_OTHER): Payer: Medicare Other | Admitting: Nurse Practitioner

## 2022-02-20 ENCOUNTER — Encounter: Payer: Self-pay | Admitting: Nurse Practitioner

## 2022-02-20 VITALS — BP 142/85 | HR 89 | Ht 67.0 in | Wt 221.0 lb

## 2022-02-20 DIAGNOSIS — E782 Mixed hyperlipidemia: Secondary | ICD-10-CM

## 2022-02-20 DIAGNOSIS — E1143 Type 2 diabetes mellitus with diabetic autonomic (poly)neuropathy: Secondary | ICD-10-CM

## 2022-02-20 DIAGNOSIS — Z1231 Encounter for screening mammogram for malignant neoplasm of breast: Secondary | ICD-10-CM | POA: Diagnosis not present

## 2022-02-20 DIAGNOSIS — Z794 Long term (current) use of insulin: Secondary | ICD-10-CM | POA: Diagnosis not present

## 2022-02-20 DIAGNOSIS — I10 Essential (primary) hypertension: Secondary | ICD-10-CM

## 2022-02-20 MED ORDER — EZETIMIBE 10 MG PO TABS: 10.0000 mg | ORAL_TABLET | Freq: Every day | ORAL | 3 refills | Status: DC

## 2022-02-20 MED ORDER — HYDROCHLOROTHIAZIDE 12.5 MG PO TABS: 12.5000 mg | ORAL_TABLET | Freq: Every day | ORAL | 3 refills | Status: DC

## 2022-02-20 NOTE — Assessment & Plan Note (Addendum)
BP Readings from Last 3 Encounters:  02/20/22 (!) 148/90  11/09/21 (!) 149/83  07/08/21 (!) 148/78  Chronic condition uncontrolled BP goal is less than 130/80 Currently on amlodipine 10 mg daily, start hydrochlorothiazide 12.5 mg daily,cmp in 2 weeks Follow-up in 4 weeks DASH diet advised engage in regular daily walking exercises at least 150 minutes weekly as tolerated Monitor blood pressure daily at home keep a log and bring to next follow-up appointment in 4 weeks

## 2022-02-20 NOTE — Assessment & Plan Note (Signed)
Chronic condition uncontrolled urrently on Levemir 36 units twice daily, Jardiance 10 mg daily, Humalog 12 units with each meal, TID, patient denies hypoglycemia, she has been checking her blood sugars 4 times daily. Lab Results  Component Value Date   HGBA1C 8.2 (H) 07/08/2021  Diabetic eye exam scheduled today Avoid sugar sweets soda Check A1c 3 to 5 days before next appointment Follow-up in 4 weeks

## 2022-02-20 NOTE — Assessment & Plan Note (Addendum)
Lab Results  Component Value Date   CHOL 189 07/08/2021   HDL 62 07/08/2021   LDLCALC 108 (H) 07/08/2021   TRIG 101 07/08/2021   CHOLHDL 3.0 07/08/2021  LDL goal is less than 70 Currently taking Crestor 40 mg daily states that she has not been taking Zetia Patient told to start taking Zetia 10 mg daily Lipid panel 3 to 5 days before next visit Avoid fried fatty foods

## 2022-02-20 NOTE — Progress Notes (Signed)
 New Patient Office Visit  Subjective    Patient ID: Sue Wood, female    DOB: 09/27/1945  Age: 76 y.o. MRN: [id number]  CC:  Chief Complaint  Patient presents with   New Patient (Initial Visit)    np    HPI Sue Wood with past medical history of type 2 diabetes, hypertension, hyperlipidemia, osteoporosis, obesity, diabetic neuropathy, presents to establish care for her chronic medical conditions. Previous PCP Jessica Martiness NP, at [place] last visit to previous PCP was 8 months ago.   Hypertension.  Currently on amlodipine 10 mg daily, states that she has been taking medication daily as prescribed denies chest pain, edema, dizziness, headaches.  Hyperlipidemia.  Currently on Crestor 40 mg daily, states that she is not taking Zetia 10 mg.   Type 2 diabetes.  Currently on Levemir 36 units twice daily, Jardiance 10 mg daily, Humalog 12 units with each meal, patient denies hypoglycemia, she has been checking her blood sugars 4 times daily.  Due for diabetic eye exam diabetic eye exam scheduled today, due for mammogram referral sent for screening mammogram.  Due for shingles vaccine need for shingles vaccine discussed with patient patient encouraged to get the vaccine at her pharmacy     Outpatient Encounter Medications as of 02/20/2022  Medication Sig   Alcohol Swabs 70 % PADS Use 1-4 times daily prn   amLODipine (NORVASC) 10 MG tablet Take 1 tablet (10 mg total) by mouth daily.   aspirin 81 MG tablet Take 1 tablet (81 mg total) by mouth at bedtime.   Blood Glucose Monitoring Suppl (BLOOD GLUCOSE SYSTEM PAK) KIT Please dispense based on patient and insurance preference. Use as directed to monitor FSBS 3x daily. Dx: E11.9.   empagliflozin (JARDIANCE) 10 MG TABS tablet Take 1 tablet (10 mg total) by mouth daily before breakfast.   ezetimibe (ZETIA) 10 MG tablet Take 1 tablet (10 mg total) by mouth daily.   glucose blood (ONETOUCH ULTRA) test strip USE TO MONITOR  FASTING  BLOOD SUGAR 3 TIMES DAILY  AS DIRECTED   HUMALOG KWIKPEN 100 UNIT/ML KwikPen INJECT 12 UNITS SUBCUTANEOUSLY WITH EACH MEAL   hydrochlorothiazide (HYDRODIURIL) 12.5 MG tablet Take 1 tablet (12.5 mg total) by mouth daily.   insulin detemir (LEVEMIR FLEXTOUCH) 100 UNIT/ML FlexPen 36 units in the morning and 36 units at night.   Lancets MISC Please dispense based on patient and insurance preference. Use as directed to monitor FSBS 3x daily. Dx: E11.9.   nortriptyline (PAMELOR) 25 MG capsule Take 1 capsule (25 mg total) by mouth at bedtime.   rosuvastatin (CRESTOR) 40 MG tablet Take 1 tablet (40 mg total) by mouth daily.   triamcinolone cream (KENALOG) 0.1 % Apply 1 application topically 2 (two) times daily.   [DISCONTINUED] cephALEXin (KEFLEX) 500 MG capsule Take 1 capsule (500 mg total) by mouth every 6 (six) hours.   No facility-administered encounter medications on file as of 02/20/2022.    Past Medical History:  Diagnosis Date   Arthritis    Diabetes mellitus    Hyperlipidemia    Hypertension    Peripheral neuropathy     Past Surgical History:  Procedure Laterality Date   ABDOMINAL HYSTERECTOMY     CESAREAN SECTION     X 2   COLONOSCOPY N/A 12/08/2013   Procedure: COLONOSCOPY;  Surgeon: Najeeb U Rehman, MD;  [facility];  Service: Endoscopy;  Laterality: N/A;  930   KNEE ARTHROSCOPY  1998   left      Family History  Problem Relation Age of Onset   Hypertension Mother    Hyperlipidemia Mother    Hypertension Father    Hyperlipidemia Father    Diabetes Father    Colon cancer Neg Hx     Social History   Socioeconomic History   Marital status: Single    Spouse name: Not on file   Number of children: 2   Years of education: Not on file   Highest education level: Not on file  Occupational History   Not on file  Tobacco Use   Smoking status: Former    Packs/day: 0.25    Years: 18.00    Total pack years: 4.50    Types: Cigarettes   Smokeless tobacco:  Never   Tobacco comments:    quit 1980's  Substance and Sexual Activity   Alcohol use: No   Drug use: No   Sexual activity: Not Currently  Other Topics Concern   Not on file  Social History Narrative   Lives with her daughter    Social Determinants of Health   Financial Resource Strain: Low Risk  (05/17/2020)   Overall Financial Resource Strain (CARDIA)    Difficulty of Paying Living Expenses: Not very hard  Food Insecurity: Not on file  Transportation Needs: Not on file  Physical Activity: Not on file  Stress: Not on file  Social Connections: Not on file  Intimate Partner Violence: Not on file    Review of Systems  Constitutional: Negative.  Negative for chills and fever.  Respiratory: Negative.  Negative for cough, hemoptysis and sputum production.   Cardiovascular: Negative.  Negative for chest pain, palpitations, orthopnea and claudication.  Gastrointestinal: Negative.  Negative for abdominal pain, heartburn, nausea and vomiting.  Genitourinary: Negative.  Negative for dysuria, frequency and urgency.  Neurological: Negative.  Negative for dizziness, sensory change and headaches.  Psychiatric/Behavioral: Negative.  Negative for depression, hallucinations, substance abuse and suicidal ideas.         Objective    BP (!) 142/85   Pulse 89   Ht 5' 7" (1.702 m)   Wt 221 lb (100.2 kg)   SpO2 95%   BMI 34.61 kg/m   Physical Exam Constitutional:      General: She is not in acute distress.    Appearance: She is obese. She is not ill-appearing, toxic-appearing or diaphoretic.  Cardiovascular:     Rate and Rhythm: Normal rate and regular rhythm.     Pulses: Normal pulses.     Heart sounds: Normal heart sounds. No murmur heard.    No friction rub. No gallop.  Pulmonary:     Effort: Pulmonary effort is normal. No respiratory distress.     Breath sounds: Normal breath sounds. No stridor. No wheezing, rhonchi or rales.  Chest:     Chest wall: No tenderness.  Abdominal:      Palpations: Abdomen is soft.     Tenderness: There is no abdominal tenderness.  Skin:    General: Skin is warm and dry.     Capillary Refill: Capillary refill takes less than 2 seconds.     Coloration: Skin is not jaundiced or pale.     Findings: No bruising or erythema.  Neurological:     Mental Status: She is alert and oriented to person, place, and time.  Psychiatric:        Mood and Affect: Mood normal.        Behavior: Behavior normal.  Thought Content: Thought content normal.        Judgment: Judgment normal.         Assessment & Plan:   Problem List Items Addressed This Visit       Cardiovascular and Mediastinum   Essential hypertension, benign - Primary    BP Readings from Last 3 Encounters:  02/20/22 (!) 148/90  11/09/21 (!) 149/83  07/08/21 (!) 148/78  Chronic condition uncontrolled BP goal is less than 130/80 Currently on amlodipine 10 mg daily, start hydrochlorothiazide 12.5 mg daily,cmp in 2 weeks Follow-up in 4 weeks DASH diet advised engage in regular daily walking exercises at least 150 minutes weekly as tolerated Monitor blood pressure daily at home keep a log and bring to next follow-up appointment in 4 weeks      Relevant Medications   hydrochlorothiazide (HYDRODIURIL) 12.5 MG tablet   Other Relevant Orders   CMP14+EGFR     Endocrine   Diabetes mellitus with neurological manifestation (HCC)    Chronic condition uncontrolled urrently on Levemir 36 units twice daily, Jardiance 10 mg daily, Humalog 12 units with each meal, TID, patient denies hypoglycemia, she has been checking her blood sugars 4 times daily. Lab Results  Component Value Date   HGBA1C 8.2 (H) 07/08/2021  Diabetic eye exam scheduled today Avoid sugar sweets soda Check A1c 3 to 5 days before next appointment Follow-up in 4 weeks      Relevant Orders   Urine microalbumin-creatinine with uACR   HgB A1c   CBC with Differential     Other   Hyperlipidemia    Lab  Results  Component Value Date   CHOL 189 07/08/2021   HDL 62 07/08/2021   LDLCALC 108 (H) 07/08/2021   TRIG 101 07/08/2021   CHOLHDL 3.0 07/08/2021  LDL goal is less than 70 Currently taking Crestor 40 mg daily states that she has not been taking Zetia Patient told to start taking Zetia 10 mg daily Lipid panel 3 to 5 days before next visit Avoid fried fatty foods      Relevant Medications   hydrochlorothiazide (HYDRODIURIL) 12.5 MG tablet   Other Relevant Orders   Lipid Profile   Other Visit Diagnoses     Encounter for screening mammogram for malignant neoplasm of breast       Relevant Orders   MM 3D SCREEN BREAST BILATERAL       Return in about 4 weeks (around 03/20/2022) for CPE.   Renee Rival, FNP

## 2022-02-20 NOTE — Patient Instructions (Addendum)
Please start taking hydrochlorothiazide 12.'5mg'$  daily for your blood pressure , continue amlodipine '10mg'$  daily. Blood pressure goal is less than 130/80. Pleas monitor your blood pressure at home, keep a log and bring to next appointment in 4 weeks.    Please get your shingles vaccine at your pharmacy   Pleas schedule your diabetic eye exam at the office today.     It is important that you exercise regularly at least 30 minutes 5 times a week.  Think about what you will eat, plan ahead. Choose " clean, green, fresh or frozen" over canned, processed or packaged foods which are more sugary, salty and fatty. 70 to 75% of food eaten should be vegetables and fruit. Three meals at set times with snacks allowed between meals, but they must be fruit or vegetables. Aim to eat over a 12 hour period , example 7 am to 7 pm, and STOP after  your last meal of the day. Drink water,generally about 64 ounces per day, no other drink is as healthy. Fruit juice is best enjoyed in a healthy way, by EATING the fruit.  Thanks for choosing [facility], we consider it a privelige to serve you.

## 2022-02-21 ENCOUNTER — Telehealth: Payer: Self-pay | Admitting: Nurse Practitioner

## 2022-02-21 ENCOUNTER — Other Ambulatory Visit: Payer: Self-pay | Admitting: Nurse Practitioner

## 2022-02-21 ENCOUNTER — Other Ambulatory Visit: Payer: Self-pay

## 2022-02-21 DIAGNOSIS — Z794 Long term (current) use of insulin: Secondary | ICD-10-CM

## 2022-02-21 MED ORDER — LEVEMIR FLEXTOUCH 100 UNIT/ML ~~LOC~~ SOPN: PEN_INJECTOR | SUBCUTANEOUS | 3 refills | Status: DC

## 2022-02-21 NOTE — Telephone Encounter (Signed)
Rx sent 

## 2022-02-21 NOTE — Telephone Encounter (Signed)
Patient needs refill on  insulin detemir (LEVEMIR FLEXTOUCH) 100 UNIT/ML FlexPen    Patient headed to pharm

## 2022-02-24 NOTE — Telephone Encounter (Signed)
Requested Prescriptions  Pending Prescriptions Disp Refills  . rosuvastatin (CRESTOR) 40 MG tablet [Pharmacy Med Name: Rosuvastatin Calcium 40 MG Oral Tablet] 90 tablet 3    Sig: TAKE 1 TABLET BY MOUTH  DAILY     There is no refill protocol information for this order

## 2022-03-04 ENCOUNTER — Other Ambulatory Visit: Payer: Self-pay

## 2022-03-04 DIAGNOSIS — I10 Essential (primary) hypertension: Secondary | ICD-10-CM

## 2022-03-05 ENCOUNTER — Other Ambulatory Visit: Payer: Self-pay

## 2022-03-05 DIAGNOSIS — E782 Mixed hyperlipidemia: Secondary | ICD-10-CM

## 2022-03-05 DIAGNOSIS — Z794 Long term (current) use of insulin: Secondary | ICD-10-CM

## 2022-03-05 DIAGNOSIS — I10 Essential (primary) hypertension: Secondary | ICD-10-CM

## 2022-03-05 MED ORDER — HUMALOG KWIKPEN 100 UNIT/ML ~~LOC~~ SOPN: PEN_INJECTOR | SUBCUTANEOUS | 5 refills | Status: DC

## 2022-03-05 MED ORDER — EZETIMIBE 10 MG PO TABS: 10.0000 mg | ORAL_TABLET | Freq: Every day | ORAL | 3 refills | Status: DC

## 2022-03-05 MED ORDER — ONETOUCH ULTRA VI STRP: ORAL_STRIP | 3 refills | Status: DC

## 2022-03-05 MED ORDER — LEVEMIR FLEXTOUCH 100 UNIT/ML ~~LOC~~ SOPN: PEN_INJECTOR | SUBCUTANEOUS | 3 refills | Status: DC

## 2022-03-05 MED ORDER — HYDROCHLOROTHIAZIDE 12.5 MG PO TABS: 12.5000 mg | ORAL_TABLET | Freq: Every day | ORAL | 3 refills | Status: DC

## 2022-03-13 ENCOUNTER — Other Ambulatory Visit: Payer: Self-pay | Admitting: Nurse Practitioner

## 2022-03-18 ENCOUNTER — Other Ambulatory Visit: Payer: Self-pay

## 2022-03-18 MED ORDER — BLOOD GLUCOSE MONITORING SUPPL KIT: PACK | 0 refills | Status: DC

## 2022-03-18 MED ORDER — AMLODIPINE BESYLATE 10 MG PO TABS: 10.0000 mg | ORAL_TABLET | Freq: Every day | ORAL | 3 refills | Status: DC

## 2022-03-28 DIAGNOSIS — I10 Essential (primary) hypertension: Secondary | ICD-10-CM | POA: Diagnosis not present

## 2022-03-28 DIAGNOSIS — E782 Mixed hyperlipidemia: Secondary | ICD-10-CM | POA: Diagnosis not present

## 2022-03-28 DIAGNOSIS — E1143 Type 2 diabetes mellitus with diabetic autonomic (poly)neuropathy: Secondary | ICD-10-CM | POA: Diagnosis not present

## 2022-03-28 DIAGNOSIS — Z794 Long term (current) use of insulin: Secondary | ICD-10-CM | POA: Diagnosis not present

## 2022-03-28 DIAGNOSIS — Z1231 Encounter for screening mammogram for malignant neoplasm of breast: Secondary | ICD-10-CM | POA: Diagnosis not present

## 2022-03-29 NOTE — Progress Notes (Signed)
I will review results with patient at her upcoming appointment

## 2022-03-30 LAB — MICROALBUMIN / CREATININE URINE RATIO
Creatinine, Urine: 125.9 mg/dL
Microalb/Creat Ratio: 145 mg/g creat — ABNORMAL HIGH (ref 0–29)
Microalbumin, Urine: 182.2 ug/mL

## 2022-03-30 LAB — CBC WITH DIFFERENTIAL/PLATELET
Basophils Absolute: 0.1 10*3/uL (ref 0.0–0.2)
Basos: 1 %
EOS (ABSOLUTE): 0.2 10*3/uL (ref 0.0–0.4)
Eos: 3 %
Hematocrit: 37.8 % (ref 34.0–46.6)
Hemoglobin: 12.1 g/dL (ref 11.1–15.9)
Immature Grans (Abs): 0 10*3/uL (ref 0.0–0.1)
Immature Granulocytes: 0 %
Lymphocytes Absolute: 1.8 10*3/uL (ref 0.7–3.1)
Lymphs: 26 %
MCH: 27.9 pg (ref 26.6–33.0)
MCHC: 32 g/dL (ref 31.5–35.7)
MCV: 87 fL (ref 79–97)
Monocytes Absolute: 0.3 10*3/uL (ref 0.1–0.9)
Monocytes: 5 %
Neutrophils Absolute: 4.5 10*3/uL (ref 1.4–7.0)
Neutrophils: 65 %
Platelets: 224 10*3/uL (ref 150–450)
RBC: 4.34 x10E6/uL (ref 3.77–5.28)
RDW: 13.7 % (ref 11.7–15.4)
WBC: 6.9 10*3/uL (ref 3.4–10.8)

## 2022-03-30 LAB — HEMOGLOBIN A1C
Est. average glucose Bld gHb Est-mCnc: 232 mg/dL
Hgb A1c MFr Bld: 9.7 % — ABNORMAL HIGH (ref 4.8–5.6)

## 2022-03-30 LAB — LIPID PANEL
Chol/HDL Ratio: 2.6 ratio (ref 0.0–4.4)
Cholesterol, Total: 134 mg/dL (ref 100–199)
HDL: 51 mg/dL (ref >39)
LDL Chol Calc (NIH): 67 mg/dL (ref 0–99)
Triglycerides: 82 mg/dL (ref 0–149)
VLDL Cholesterol Cal: 16 mg/dL (ref 5–40)

## 2022-04-01 ENCOUNTER — Ambulatory Visit: Payer: Medicare Other

## 2022-04-04 ENCOUNTER — Ambulatory Visit (INDEPENDENT_AMBULATORY_CARE_PROVIDER_SITE_OTHER): Payer: Medicare Other | Admitting: Nurse Practitioner

## 2022-04-04 ENCOUNTER — Encounter: Payer: Self-pay | Admitting: Nurse Practitioner

## 2022-04-04 VITALS — BP 138/78 | HR 90 | Ht 66.0 in | Wt 220.0 lb

## 2022-04-04 DIAGNOSIS — E0840 Diabetes mellitus due to underlying condition with diabetic neuropathy, unspecified: Secondary | ICD-10-CM

## 2022-04-04 DIAGNOSIS — Z0001 Encounter for general adult medical examination with abnormal findings: Secondary | ICD-10-CM | POA: Diagnosis not present

## 2022-04-04 DIAGNOSIS — E559 Vitamin D deficiency, unspecified: Secondary | ICD-10-CM | POA: Diagnosis not present

## 2022-04-04 DIAGNOSIS — Z0000 Encounter for general adult medical examination without abnormal findings: Secondary | ICD-10-CM

## 2022-04-04 DIAGNOSIS — Z794 Long term (current) use of insulin: Secondary | ICD-10-CM

## 2022-04-04 DIAGNOSIS — I10 Essential (primary) hypertension: Secondary | ICD-10-CM

## 2022-04-04 MED ORDER — EMPAGLIFLOZIN 25 MG PO TABS: 25.0000 mg | ORAL_TABLET | Freq: Every day | ORAL | 0 refills | Status: DC

## 2022-04-04 MED ORDER — GVOKE HYPOPEN 1-PACK 0.5 MG/0.1ML ~~LOC~~ SOAJ: 0.5000 mg | Freq: Every day | SUBCUTANEOUS | 0 refills | Status: AC | PRN

## 2022-04-04 NOTE — Assessment & Plan Note (Addendum)
BP Readings from Last 3 Encounters:  04/04/22 138/78  02/20/22 (!) 142/85  11/09/21 (!) 149/83  Blood pressure much better today Patient reports that she is tolerating hydrochlorothiazide well Currently on amlodipine 10 mg daily, hydrochlorothiazide 12.5 mg daily.  Patient reports that she has not taking her medications today usually takes them at nighttime. Continue current medication DASH diet advised CMP today

## 2022-04-04 NOTE — Patient Instructions (Addendum)
Please start taking jardiance '25mg'$  daily.  Please get your shingles vaccine at your pharmacy.   Please continue to monitor your blood sugar at home. Bring your meter with you to your follow up appointment in 4 weeks   Goal for fasting blood sugar ranges from 80 to 120 and 2 hours after any meal or at bedtime should be between 130 to 170.   Inject 0.'5mg'$  of glucagon as needed daily for severe hypoglycemia    It is important that you exercise regularly at least 30 minutes 5 times a week.  Think about what you will eat, plan ahead. Choose " clean, green, fresh or frozen" over canned, processed or packaged foods which are more sugary, salty and fatty. 70 to 75% of food eaten should be vegetables and fruit. Three meals at set times with snacks allowed between meals, but they must be fruit or vegetables. Aim to eat over a 12 hour period , example 7 am to 7 pm, and STOP after  your last meal of the day. Drink water,generally about 64 ounces per day, no other drink is as healthy. Fruit juice is best enjoyed in a healthy way, by EATING the fruit.  Thanks for choosing [facility], we consider it a privelige to serve you.

## 2022-04-04 NOTE — Assessment & Plan Note (Addendum)
Lab Results  Component Value Date   HGBA1C 9.7 (H) 03/28/2022   Lab Results  Component Value Date   LABMICR 182.2 03/28/2022   MICROALBUR 9.4 08/15/2020   MICROALBUR 10.6 09/12/2019     Currently on Levemir 36 units twice daily, Humalog 12 units with each meal.  She has been out all Jardiance 10 mg daily not sure for how long  Home CBG has been in the 200's, denies hypoglycemia, states that she drinks soda sometimes Need to avoid sweet sugar cake soda discussed with patient Need to get A1c under control discussed Start Jardiance 25 mg daily, continue current dose of Levemir and Humalog Patient encouraged to continue to check blood sugar at home and Report hypoglycemia Follow-up in 4 weeks with her glucometer readings. Diabetic foot exam completed today Glucagon 0.5 mg injection daily as needed for hypoglycemia ordered

## 2022-04-04 NOTE — Assessment & Plan Note (Signed)
Annual exam as documented.  Counseling done include healthy lifestyle involving committing to 150 minutes of exercise per week, heart healthy diet, and attaining healthy weight. The importance of adequate sleep also discussed.  Regular use of seat belt and home safety were also discussed . Changes in health habits are decided on by patient with goals and time frames set for achieving them. Has mammogram scheduled, patient encouraged to get her shingles vaccine at the pharmacy.

## 2022-04-04 NOTE — Progress Notes (Signed)
Complete physical exam  Patient: Sue Wood   DOB: December 19, 1945   76 y.o. Female  MRN: [id number]  Subjective:    Chief Complaint  Patient presents with   Annual Exam    cpe    Sue Wood is a 76 y.o. female with past medical history of essential hypertension, uncontrolled type 2 diabetes, hyperlipidemia, osteoporosis who presents today for a complete physical exam. She reports consuming a low sodium diet. No regular excercises except doing her normal house work . She generally feels well. She reports sleeping well. She does not have additional problems to discuss today.    Due for shingles vaccine need for shingles vaccine discussed with patient patient encouraged to get the vaccine at her pharmacy.  Due for diabetic eye exam referral sent today.    Most recent fall risk assessment:    04/04/2022    1:22 PM  Sue Wood in the past year? 0  Number falls in past yr: 0  Injury with Fall? 0  Risk for fall due to : No Fall Risks  Follow up Falls evaluation completed     Most recent depression screenings:    04/04/2022    1:22 PM 02/20/2022    2:07 PM  PHQ 2/9 Scores  PHQ - 2 Score 0 0        Patient Care Team: Renee Rival, FNP as PCP - General (Nurse Practitioner) Edythe Clarity, [facility] as Pharmacist (Pharmacist) Eulogio Bear, NP (Nurse Practitioner)   Outpatient Medications Prior to Visit  Medication Sig Note   Alcohol Swabs 70 % PADS Use 1-4 times daily prn    amLODipine (NORVASC) 10 MG tablet Take 1 tablet (10 mg total) by mouth daily.    aspirin 81 MG tablet Take 1 tablet (81 mg total) by mouth at bedtime.    Blood Glucose Monitoring Suppl (BLOOD GLUCOSE SYSTEM PAK) KIT Please dispense based on patient and insurance preference. Use as directed to monitor FSBS 3x daily. Dx: E11.9.    Blood Glucose Monitoring Suppl KIT Please dispense one touch ultra 2 kit. Used as directed to monitor blood glucose 3 times a day. DX: E11.9     ezetimibe (ZETIA) 10 MG tablet Take 1 tablet (10 mg total) by mouth daily.    glucose blood (ONETOUCH ULTRA) test strip USE TO MONITOR FASTING  BLOOD SUGAR 3 TIMES DAILY  AS DIRECTED    HUMALOG KWIKPEN 100 UNIT/ML KwikPen INJECT 12 UNITS SUBCUTANEOUSLY WITH EACH MEAL    hydrochlorothiazide (HYDRODIURIL) 12.5 MG tablet Take 1 tablet (12.5 mg total) by mouth daily.    insulin detemir (LEVEMIR FLEXTOUCH) 100 UNIT/ML FlexPen 36 units in the morning and 36 units at night.    Lancets MISC Please dispense based on patient and insurance preference. Use as directed to monitor FSBS 3x daily. Dx: E11.9.    nortriptyline (PAMELOR) 25 MG capsule Take 1 capsule (25 mg total) by mouth at bedtime.    rosuvastatin (CRESTOR) 40 MG tablet TAKE 1 TABLET BY MOUTH  DAILY    triamcinolone cream (KENALOG) 0.1 % Apply 1 application topically 2 (two) times daily. 04/04/2022: As needed   [DISCONTINUED] empagliflozin (JARDIANCE) 10 MG TABS tablet Take 1 tablet (10 mg total) by mouth daily before breakfast. (Patient not taking: Reported on 04/04/2022)    No facility-administered medications prior to visit.    Review of Systems  Constitutional: Negative.  Negative for chills and fever.  HENT: Negative.  Negative for ear  discharge, ear pain, hearing loss and tinnitus.   Eyes: Negative.  Negative for blurred vision, double vision, photophobia and pain.  Respiratory: Negative.  Negative for cough, hemoptysis, sputum production and shortness of breath.   Cardiovascular: Negative.  Negative for chest pain, palpitations and orthopnea.  Gastrointestinal: Negative.  Negative for abdominal pain, heartburn, nausea and vomiting.  Genitourinary: Negative.  Negative for dysuria, frequency and urgency.  Musculoskeletal: Negative.  Negative for back pain, myalgias and neck pain.  Skin: Negative.  Negative for itching and rash.  Neurological: Negative.  Negative for dizziness, tingling, tremors and headaches.  Endo/Heme/Allergies:   Negative for environmental allergies and polydipsia. Does not bruise/bleed easily.  Psychiatric/Behavioral: Negative.  Negative for depression, substance abuse and suicidal ideas.           Objective:     BP 138/78   Pulse 90   Ht _0  (1.676 m)   Wt 220 lb (99.8 kg)   SpO2 95%   BMI 35.51 kg/m    Physical Exam Vitals and nursing note reviewed. Exam conducted with a chaperone present.  Constitutional:      General: She is not in acute distress.    Appearance: She is not ill-appearing, toxic-appearing or diaphoretic.  HENT:     Head: Normocephalic and atraumatic.     Right Ear: Tympanic membrane, ear canal and external ear normal. There is no impacted cerumen.     Left Ear: Tympanic membrane, ear canal and external ear normal. There is no impacted cerumen.     Nose: Nose normal. No congestion or rhinorrhea.     Mouth/Throat:     Mouth: Mucous membranes are moist.     Pharynx: Oropharynx is clear. No oropharyngeal exudate or posterior oropharyngeal erythema.  Eyes:     General: No scleral icterus.       Right eye: No discharge.        Left eye: No discharge.     Extraocular Movements: Extraocular movements intact.     Conjunctiva/sclera: Conjunctivae normal.     Pupils: Pupils are equal, round, and reactive to light.  Neck:     Vascular: No carotid bruit.  Cardiovascular:     Rate and Rhythm: Normal rate and regular rhythm.     Pulses: Normal pulses.     Heart sounds: Normal heart sounds. No murmur heard.    No friction rub. No gallop.  Pulmonary:     Effort: Pulmonary effort is normal. No respiratory distress.     Breath sounds: Normal breath sounds. No stridor. No wheezing, rhonchi or rales.  Chest:     Chest wall: No mass, lacerations, deformity, swelling, tenderness, crepitus or edema.  Breasts:    Tanner Score is 5.     Breasts are symmetrical.     Right: Normal. No swelling, bleeding, inverted nipple, mass, nipple discharge, skin change or tenderness.      Left: Normal. No swelling, bleeding, inverted nipple, mass, nipple discharge, skin change or tenderness.  Abdominal:     General: There is no distension.     Palpations: Abdomen is soft. There is no mass.     Tenderness: There is no abdominal tenderness. There is no right CVA tenderness, left CVA tenderness, guarding or rebound.     Hernia: No hernia is present.  Musculoskeletal:        General: No swelling, tenderness, deformity or signs of injury. Normal range of motion.     Cervical back: Normal range of motion and neck supple.  No rigidity or tenderness.     Right lower leg: No edema.     Left lower leg: No edema.  Lymphadenopathy:     Cervical: No cervical adenopathy.     Upper Body:     Right upper body: No supraclavicular, axillary or pectoral adenopathy.     Left upper body: No supraclavicular, axillary or pectoral adenopathy.  Skin:    General: Skin is warm and dry.     Capillary Refill: Capillary refill takes less than 2 seconds.     Coloration: Skin is not jaundiced or pale.     Findings: No bruising, erythema, lesion or rash.  Neurological:     Mental Status: She is alert and oriented to person, place, and time.     Cranial Nerves: No cranial nerve deficit.     Sensory: No sensory deficit.     Motor: No weakness.     Coordination: Coordination normal.     Gait: Gait normal.     Deep Tendon Reflexes: Reflexes normal.  Psychiatric:        Mood and Affect: Mood normal.        Behavior: Behavior normal.        Thought Content: Thought content normal.        Judgment: Judgment normal.      No results found for any visits on 04/04/22.     Assessment & Plan:    Routine Health Maintenance and Physical Exam  Immunization History  Administered Date(s) Administered   Fluad Quad(high Dose 65+) 09/12/2019, 08/15/2020   Influenza, High Dose Seasonal PF 06/30/2017   Influenza,inj,Quad PF,6+ Mos 06/24/2013, 05/23/2014, 05/09/2015, 08/13/2016, 05/11/2018    Influenza-Unspecified 09/12/2019   Moderna SARS-COV2 Booster Vaccination 02/12/2021   Moderna Sars-Covid-2 Vaccination 11/19/2019, 12/21/2019, 08/15/2020   Pneumococcal Conjugate-13 02/06/2015   Pneumococcal Polysaccharide-23 04/22/2016   Tdap 08/31/2013    Health Maintenance  Topic Date Due   Zoster Vaccines- Shingrix (1 of 2) Never done   OPHTHALMOLOGY EXAM  06/25/2019   MAMMOGRAM  11/21/2021   INFLUENZA VACCINE  04/08/2022   HEMOGLOBIN A1C  09/28/2022   URINE MICROALBUMIN  03/29/2023   FOOT EXAM  04/05/2023   TETANUS/TDAP  09/01/2023   COLONOSCOPY (Pts 45-41yr Insurance coverage will need to be confirmed)  12/09/2023   Pneumonia Vaccine 76 Years old  Completed   DEXA SCAN  Completed   Hepatitis C Screening  Completed   HPV VACCINES  Aged Out   COVID-19 Vaccine  Discontinued    Discussed health benefits of physical activity, and encouraged her to engage in regular exercise appropriate for her age and condition.  Problem List Items Addressed This Visit       Cardiovascular and Mediastinum   Essential hypertension, benign    BP Readings from Last 3 Encounters:  04/04/22 138/78  02/20/22 (!) 142/85  11/09/21 (!) 149/83  Blood pressure much better today Patient reports that she is tolerating hydrochlorothiazide well Currently on amlodipine 10 mg daily, hydrochlorothiazide 12.5 mg daily.  Patient reports that she has not taking her medications today usually takes them at nighttime. Continue current medication DASH diet advised CMP today      Relevant Orders   CMP14+EGFR     Endocrine   Diabetes mellitus with neurological manifestation ([facility]    Lab Results  Component Value Date   HGBA1C 9.7 (H) 03/28/2022   Lab Results  Component Value Date   LABMICR 182.2 03/28/2022   MICROALBUR 9.4 08/15/2020   MICROALBUR 10.6 09/12/2019  Currently on Levemir 36 units twice daily, Humalog 12 units with each meal.  She has been out all Jardiance 10 mg daily not sure for  how long  Home CBG has been in the 200's, denies hypoglycemia, states that she drinks soda sometimes Need to avoid sweet sugar cake soda discussed with patient Need to get A1c under control discussed Start Jardiance 25 mg daily, continue current dose of Levemir and Humalog Patient encouraged to continue to check blood sugar at home and Report hypoglycemia Follow-up in 4 weeks with her glucometer readings. Diabetic foot exam completed today Glucagon 0.5 mg injection daily as needed for hypoglycemia ordered       Relevant Medications   empagliflozin (JARDIANCE) 25 MG TABS tablet   Glucagon (GVOKE HYPOPEN 1-PACK) 0.5 MG/0.1ML SOAJ   Other Relevant Orders   CMP14+EGFR   Ambulatory referral to Ophthalmology     Other   Annual physical exam - Primary    Annual exam as documented.  Counseling done include healthy lifestyle involving committing to 150 minutes of exercise per week, heart healthy diet, and attaining healthy weight. The importance of adequate sleep also discussed.  Regular use of seat belt and home safety were also discussed . Changes in health habits are decided on by patient with goals and time frames set for achieving them. Has mammogram scheduled, patient encouraged to get her shingles vaccine at the pharmacy.      Relevant Orders   TSH   Vitamin D (25 hydroxy)   Return in about 4 weeks (around 05/02/2022).     Renee Rival, FNP

## 2022-04-05 ENCOUNTER — Other Ambulatory Visit: Payer: Self-pay | Admitting: Nurse Practitioner

## 2022-04-05 DIAGNOSIS — I10 Essential (primary) hypertension: Secondary | ICD-10-CM

## 2022-04-05 LAB — CMP14+EGFR
ALT: 25 IU/L (ref 0–32)
AST: 23 IU/L (ref 0–40)
Albumin/Globulin Ratio: 1.2 (ref 1.2–2.2)
Albumin: 4.3 g/dL (ref 3.8–4.8)
Alkaline Phosphatase: 85 IU/L (ref 44–121)
BUN/Creatinine Ratio: 15 (ref 12–28)
BUN: 15 mg/dL (ref 8–27)
Bilirubin Total: <0.2 mg/dL (ref 0.0–1.2)
CO2: 25 mmol/L (ref 20–29)
Calcium: 10.5 mg/dL — ABNORMAL HIGH (ref 8.7–10.3)
Chloride: 98 mmol/L (ref 96–106)
Creatinine, Ser: 1.03 mg/dL — ABNORMAL HIGH (ref 0.57–1.00)
Globulin, Total: 3.5 g/dL (ref 1.5–4.5)
Glucose: 228 mg/dL — ABNORMAL HIGH (ref 70–99)
Potassium: 4.3 mmol/L (ref 3.5–5.2)
Sodium: 140 mmol/L (ref 134–144)
Total Protein: 7.8 g/dL (ref 6.0–8.5)
eGFR: 57 mL/min/{1.73_m2} — ABNORMAL LOW (ref >59)

## 2022-04-05 LAB — VITAMIN D 25 HYDROXY (VIT D DEFICIENCY, FRACTURES): Vit D, 25-Hydroxy: 41.6 ng/mL (ref 30.0–100.0)

## 2022-04-05 LAB — TSH: TSH: 2.36 u[IU]/mL (ref 0.450–4.500)

## 2022-04-05 NOTE — Progress Notes (Signed)
Kidney function is slightly decreased,Calcium level is elevated patient should avoid calcium and vitamin d supplements Patient should drink at least 64 ounces of water daily to maintain hydration avoid Aleve ibuprofen Check BMP in 2 weeks  Other labs are normal

## 2022-04-21 ENCOUNTER — Ambulatory Visit
Admission: RE | Admit: 2022-04-21 | Discharge: 2022-04-21 | Disposition: A | Discharge status: Home / Self Care | Payer: Medicare Other | Source: Ambulatory Visit | Attending: Nurse Practitioner | Admitting: Nurse Practitioner

## 2022-04-21 DIAGNOSIS — Z1231 Encounter for screening mammogram for malignant neoplasm of breast: Secondary | ICD-10-CM | POA: Diagnosis not present

## 2022-04-24 NOTE — Progress Notes (Signed)
Normal mammogram

## 2022-04-28 ENCOUNTER — Other Ambulatory Visit: Payer: Self-pay | Admitting: Nurse Practitioner

## 2022-04-28 DIAGNOSIS — E782 Mixed hyperlipidemia: Secondary | ICD-10-CM

## 2022-05-15 ENCOUNTER — Ambulatory Visit (INDEPENDENT_AMBULATORY_CARE_PROVIDER_SITE_OTHER): Payer: Medicare Other | Admitting: Nurse Practitioner

## 2022-05-15 ENCOUNTER — Telehealth: Payer: Self-pay

## 2022-05-15 ENCOUNTER — Encounter: Payer: Self-pay | Admitting: Nurse Practitioner

## 2022-05-15 VITALS — BP 138/70 | HR 90 | Ht 67.0 in | Wt 215.0 lb

## 2022-05-15 DIAGNOSIS — E0840 Diabetes mellitus due to underlying condition with diabetic neuropathy, unspecified: Secondary | ICD-10-CM

## 2022-05-15 DIAGNOSIS — I10 Essential (primary) hypertension: Secondary | ICD-10-CM | POA: Diagnosis not present

## 2022-05-15 DIAGNOSIS — Z23 Encounter for immunization: Secondary | ICD-10-CM | POA: Diagnosis not present

## 2022-05-15 DIAGNOSIS — Z6835 Body mass index (BMI) 35.0-35.9, adult: Secondary | ICD-10-CM

## 2022-05-15 DIAGNOSIS — Z794 Long term (current) use of insulin: Secondary | ICD-10-CM | POA: Diagnosis not present

## 2022-05-15 NOTE — Progress Notes (Signed)
   Sue Wood     MRN: [id number]      DOB: May 06, 1946   HPI [name] past medical history essential hypertension, diabetes melitis, neuropathy is here for follow up for type 2 diabetes  Type 2 diabetes.  Currently on Jardiance 25 mg daily, Levemir 36 units twice daily Humalog 12 units 3 times daily.  Patient denies hypoglycemia polydipsia, polyuria.      She denies any adverse reaction from her current medications  Patient denies any specific complaints today      ROS Denies recent fever or chills. Denies sinus pressure, nasal congestion, ear pain or sore throat. Denies chest congestion, productive cough or wheezing. Denies chest pains, palpitations and leg swelling Denies abdominal pain, nausea, vomiting,diarrhea or constipation.   Denies dysuria, frequency, hesitancy or incontinence. Denies joint pain, swelling and limitation in mobility. Denies depression, anxiety or insomnia.    PE  BP 138/70 (BP Location: Right Arm, Cuff Size: Large)   Pulse 90   Ht '5\' 7"'$  (1.702 m)   Wt 215 lb (97.5 kg)   SpO2 93%   BMI 33.67 kg/m   Patient alert and oriented and in no cardiopulmonary distress.   Chest: Clear to auscultation bilaterally.  CVS: S1, S2 no murmurs, no S3.Regular rate.  ABD: Soft non tender.   Ext: No edema  MS: Adequate ROM spine, shoulders, hips and knees.  Psych: Good eye contact, normal affect. Memory intact not anxious or depressed appearing.  CNS: CN 2-12 intact, power,  normal throughout.no focal deficits noted.   Assessment & Plan Essential hypertension, benign BP Readings from Last 3 Encounters:  05/15/22 138/70  04/04/22 138/78  02/20/22 (!) 142/85  Currently on amlodipine 10 mg daily, hydrochlorothiazide 12.5 mg daily, she takes her BP medications daily in the evening so she has not taken them today. Patient encouraged to continue to take her medications as ordered DASH diet advised engage in regular daily walking  exercise.   Need for immunization against influenza Patient educated on CDC recommendation for the vaccine. Verbal consent was obtained from the patient, vaccine administered by nurse, no sign of adverse reactions noted at this time. Patient education on arm soreness and use of tylenol) for this patient  was discussed. Patient educated on the signs and symptoms of adverse effect and advise to contact the office if they occur.  Diabetes mellitus with neurological manifestation She is doing well on Jardiance 25 mg daily On Levemir 36 units twice daily, Humalog 12 units with each meal Continue current medications Patient denies hypoglycemia episodes, reports CBG readings in the 100's -200's Avoid sugar, sweets, soda, She does not want referral to endocrinologist at this time Follow-up in 2 months.  Class 2 severe obesity due to excess calories with serious comorbidity and body mass index (BMI) of 35.0 to 35.9 in adult ([place]) Wt Readings from Last 3 Encounters:  05/15/22 215 lb (97.5 kg)  04/04/22 220 lb (99.8 kg)  02/20/22 221 lb (100.2 kg)  Patient counseled on low-carb diet, encouraged to engage in regular daily exercise Currently on Jardiance for type 2 diabetes this should also assist with weight loss

## 2022-05-15 NOTE — Assessment & Plan Note (Signed)
Patient educated on CDC recommendation for the vaccine. Verbal consent was obtained from the patient, vaccine administered by nurse, no sign of adverse reactions noted at this time. Patient education on arm soreness and use of tylenol  for this patient  was discussed. Patient educated on the signs and symptoms of adverse effect and advise to contact the office if they occur.  ?

## 2022-05-15 NOTE — Assessment & Plan Note (Addendum)
BP Readings from Last 3 Encounters:  05/15/22 138/70  04/04/22 138/78  02/20/22 (!) 142/85  Currently on amlodipine 10 mg daily, hydrochlorothiazide 12.5 mg daily, she takes her BP medications daily in the evening so she has not taken them today. Patient encouraged to continue to take her medications as ordered DASH diet advised engage in regular daily walking exercise.

## 2022-05-15 NOTE — Patient Instructions (Addendum)
Please continue to take all your medications as prescribed.    It is important that you exercise regularly at least 30 minutes 5 times a week.  Think about what you will eat, plan ahead. Choose " clean, green, fresh or frozen" over canned, processed or packaged foods which are more sugary, salty and fatty. 70 to 75% of food eaten should be vegetables and fruit. Three meals at set times with snacks allowed between meals, but they must be fruit or vegetables. Aim to eat over a 12 hour period , example 7 am to 7 pm, and STOP after  your last meal of the day. Drink water,generally about 64 ounces per day, no other drink is as healthy. Fruit juice is best enjoyed in a healthy way, by EATING the fruit.  Thanks for choosing [facility], we consider it a privelige to serve you.

## 2022-05-15 NOTE — Assessment & Plan Note (Signed)
She is doing well on Jardiance 25 mg daily On Levemir 36 units twice daily, Humalog 12 units with each meal Continue current medications Patient denies hypoglycemia episodes, reports CBG readings in the 100's -200's Avoid sugar, sweets, soda, She does not want referral to endocrinologist at this time Follow-up in 2 months.

## 2022-05-15 NOTE — Assessment & Plan Note (Signed)
Wt Readings from Last 3 Encounters:  05/15/22 215 lb (97.5 kg)  04/04/22 220 lb (99.8 kg)  02/20/22 221 lb (100.2 kg)  Patient counseled on low-carb diet, encouraged to engage in regular daily exercise Currently on Jardiance for type 2 diabetes this should also assist with weight loss

## 2022-05-28 ENCOUNTER — Ambulatory Visit (INDEPENDENT_AMBULATORY_CARE_PROVIDER_SITE_OTHER): Payer: Medicare Other | Admitting: Nurse Practitioner

## 2022-05-28 DIAGNOSIS — Z0001 Encounter for general adult medical examination with abnormal findings: Secondary | ICD-10-CM | POA: Diagnosis not present

## 2022-05-28 DIAGNOSIS — E0840 Diabetes mellitus due to underlying condition with diabetic neuropathy, unspecified: Secondary | ICD-10-CM | POA: Diagnosis not present

## 2022-05-28 DIAGNOSIS — Z794 Long term (current) use of insulin: Secondary | ICD-10-CM | POA: Diagnosis not present

## 2022-05-28 DIAGNOSIS — Z0000 Encounter for general adult medical examination without abnormal findings: Secondary | ICD-10-CM

## 2022-05-28 NOTE — Progress Notes (Signed)
I connected with  Sue Wood on 05/28/22 by a audio enabled telemedicine application and verified that I am speaking with the correct person using two identifiers.  Patient Location: Home  Provider Location: Home Office  I discussed the limitations of evaluation and management by telemedicine. The patient expressed understanding and agreed to proceed.  Subjective:   Sue Wood is a 76 y.o. female who presents for Medicare Annual (Subsequent) preventive examination.  Review of Systems     Cardiac Risk Factors include: diabetes mellitus;dyslipidemia;hypertension     Objective:    There were no vitals filed for this visit. There is no height or weight on file to calculate BMI.     07/04/2017    4:53 PM 12/08/2013    8:35 AM  Advanced Directives  Does Patient Have a Medical Advance Directive? No Patient does not have advance directive;Patient would not like information  Would patient like information on creating a medical advance directive? No - Patient declined   Pre-existing out of facility DNR order (yellow form or pink MOST form)  No    Current Medications (verified) Outpatient Encounter Medications as of 05/28/2022  Medication Sig   Alcohol Swabs 70 % PADS Use 1-4 times daily prn   amLODipine (NORVASC) 10 MG tablet Take 1 tablet (10 mg total) by mouth daily.   aspirin 81 MG tablet Take 1 tablet (81 mg total) by mouth at bedtime.   Blood Glucose Monitoring Suppl (BLOOD GLUCOSE SYSTEM PAK) KIT Please dispense based on patient and insurance preference. Use as directed to monitor FSBS 3x daily. Dx: E11.9.   Blood Glucose Monitoring Suppl KIT Please dispense one touch ultra 2 kit. Used as directed to monitor blood glucose 3 times a day. DX: E11.9   empagliflozin (JARDIANCE) 25 MG TABS tablet Take 1 tablet (25 mg total) by mouth daily.   ezetimibe (ZETIA) 10 MG tablet TAKE 1 TABLET BY MOUTH DAILY   Glucagon (GVOKE HYPOPEN 1-PACK) 0.5 MG/0.1ML SOAJ Inject 0.5 mg into  the skin daily as needed.   glucose blood (ONETOUCH ULTRA) test strip USE TO MONITOR FASTING  BLOOD SUGAR 3 TIMES DAILY  AS DIRECTED   HUMALOG KWIKPEN 100 UNIT/ML KwikPen INJECT 12 UNITS SUBCUTANEOUSLY WITH EACH MEAL   hydrochlorothiazide (HYDRODIURIL) 12.5 MG tablet Take 1 tablet (12.5 mg total) by mouth daily.   insulin detemir (LEVEMIR FLEXTOUCH) 100 UNIT/ML FlexPen 36 units in the morning and 36 units at night.   Lancets MISC Please dispense based on patient and insurance preference. Use as directed to monitor FSBS 3x daily. Dx: E11.9.   nortriptyline (PAMELOR) 25 MG capsule Take 1 capsule (25 mg total) by mouth at bedtime.   rosuvastatin (CRESTOR) 40 MG tablet TAKE 1 TABLET BY MOUTH  DAILY   triamcinolone cream (KENALOG) 0.1 % Apply 1 application topically 2 (two) times daily.   No facility-administered encounter medications on file as of 05/28/2022.      Allergies (verified) Lisinopril   History: Past Medical History:  Diagnosis Date   Arthritis    Diabetes mellitus    Hyperlipidemia    Hypertension    Peripheral neuropathy    Past Surgical History:  Procedure Laterality Date   ABDOMINAL HYSTERECTOMY     CESAREAN SECTION     X 2   COLONOSCOPY N/A 12/08/2013   Procedure: COLONOSCOPY;  Surgeon: Rogene Houston, MD;  [facility];  Service: Endoscopy;  Laterality: N/A;  [place]   left  Family History  Problem Relation Age of Onset   Hypertension Mother    Hyperlipidemia Mother    Hypertension Father    Hyperlipidemia Father    Diabetes Father    Hypertension Sister    Lung disease Sister    Heart disease Sister    Stroke Sister    Emphysema Brother    Diabetes Brother    Heart disease Brother    Colon cancer Neg Hx    Breast cancer Neg Hx    Social History   Socioeconomic History   Marital status: Single    Spouse name: Not on file   Number of children: 2   Years of education: Not on file   Highest education level: Not on  file  Occupational History   Not on file  Tobacco Use   Smoking status: Former    Packs/day: 0.25    Years: 18.00    Total pack years: 4.50    Types: Cigarettes   Smokeless tobacco: Never   Tobacco comments:    quit 1980's  Substance and Sexual Activity   Alcohol use: No   Drug use: No   Sexual activity: Not Currently  Other Topics Concern   Not on file  Social History Narrative   Lives with her daughter    Social Determinants of Health   Financial Resource Strain: Low Risk  (05/28/2022)   Overall Financial Resource Strain (CARDIA)    Difficulty of Paying Living Expenses: Not very hard  Food Insecurity: No Food Insecurity (05/28/2022)   Hunger Vital Sign    Worried About Running Out of Food in the Last Year: Never true    Ran Out of Food in the Last Year: Never true  Transportation Needs: No Transportation Needs (05/28/2022)   PRAPARE - Hydrologist (Medical): No    Lack of Transportation (Non-Medical): No  Physical Activity: Insufficiently Active (05/28/2022)   Exercise Vital Sign    Days of Exercise per Week: 2 days    Minutes of Exercise per Session: 20 min  Stress: No Stress Concern Present (05/28/2022)   [place]    Feeling of Stress : Not at all  Social Connections: Socially Isolated (05/28/2022)   Social Connection and Isolation Panel [NHANES]    Frequency of Communication with Friends and Family: Three times a week    Frequency of Social Gatherings with Friends and Family: Twice a week    Attends Religious Services: Never    Marine scientist or Organizations: No    Attends Archivist Meetings: Never    Marital Status: Never married    Tobacco Counseling Counseling given: Not Answered Tobacco comments: quit 1980's   Clinical Intake:              How often do you need to have someone help you when you read instructions, pamphlets, or other  written materials from your doctor or pharmacy?: 1 - Never What is the last grade level you completed in school?: 11th grade  Diabetic?Marland Kitchenawv         Activities of Daily Living    05/28/2022   12:02 PM  In your present state of health, do you have any difficulty performing the following activities:  Hearing? 1  Vision? 0  Difficulty concentrating or making decisions? 0  Walking or climbing stairs? 0  Dressing or bathing? 0  Doing errands, shopping? 0  Preparing Food and eating ?  N  Using the Toilet? N  In the past six months, have you accidently leaked urine? N  Do you have problems with loss of bowel control? N  Managing your Medications? N  Managing your Finances? N  Housekeeping or managing your Housekeeping? N    Patient Care Team: Renee Rival, FNP as PCP - General (Nurse Practitioner) Edythe Clarity, [facility] as Pharmacist (Pharmacist) Eulogio Bear, NP (Nurse Practitioner)  Indicate any recent Medical Services you may have received from other than [facility] providers in the past year (date may be approximate).     Assessment:   This is a routine wellness examination for [place].  Hearing/Vision screen No results found.  Dietary issues and exercise activities discussed: Current Exercise Habits: The patient does not participate in regular exercise at present   Goals Addressed   None    Depression Screen    05/15/2022    3:21 PM 04/04/2022    1:22 PM 02/20/2022    2:07 PM 08/15/2020    8:13 AM 10/18/2019    3:57 PM 09/12/2019    2:51 PM 04/20/2018    9:07 AM  PHQ 2/9 Scores  PHQ - 2 Score 0 0 0 0 0 0 0    Fall Risk    05/28/2022   12:02 PM 05/15/2022    3:21 PM 04/04/2022    1:22 PM 02/20/2022    2:07 PM 08/15/2020    8:13 AM  [place] in the past year? 0 0 0 0 0  Number falls in past yr: 0 0 0 0   Injury with Fall? 0 0 0 0   Risk for fall due to :  Orthopedic patient No Fall Risks No Fall Risks No Fall Risks  Follow up  Falls evaluation  completed Falls evaluation completed Falls evaluation completed Falls evaluation completed    [place]:  Any stairs in or around the home? No  If so, are there any without handrails? No  Home free of loose throw rugs in walkways, pet beds, electrical cords, etc? Yes  Adequate lighting in your home to reduce risk of falls? Yes   ASSISTIVE DEVICES UTILIZED TO PREVENT FALLS:  Life alert? No  Use of a cane, walker or w/c? No  Grab bars in the bathroom? No  Shower chair or bench in shower? No  Elevated toilet seat or a handicapped toilet? No   Cognitive Function:        05/28/2022   12:04 PM  6CIT Screen  What Year? 0 points  What month? 0 points  What time? 0 points  Count back from 20 0 points  Months in reverse 0 points  Repeat phrase 4 points  Total Score 4 points    Immunizations Immunization History  Administered Date(s) Administered   Fluad Quad(high Dose 65+) 09/12/2019, 08/15/2020, 05/15/2022   Influenza, High Dose Seasonal PF 06/30/2017   Influenza,inj,Quad PF,6+ Mos 06/24/2013, 05/23/2014, 05/09/2015, 08/13/2016, 05/11/2018   Influenza-Unspecified 09/12/2019   Moderna SARS-COV2 Booster Vaccination 02/12/2021   Moderna Sars-Covid-2 Vaccination 11/19/2019, 12/21/2019, 08/15/2020   Pneumococcal Conjugate-13 02/06/2015   Pneumococcal Polysaccharide-23 04/22/2016   Tdap 08/31/2013    TDAP status: Up to date  Flu Vaccine status: Due, Education has been provided regarding the importance of this vaccine. Advised may receive this vaccine at local pharmacy or Health Dept. Aware to provide a copy of the vaccination record if obtained from local pharmacy or Health Dept. Verbalized acceptance  and understanding.  Pneumococcal vaccine status: Up to date  Covid-19 vaccine status: Completed vaccines  Qualifies for Shingles Vaccine? Yes   Zostavax completed No   Shingrix Completed?: No.    Education has been provided regarding the  importance of this vaccine. Patient has been advised to call insurance company to determine out of pocket expense if they have not yet received this vaccine. Advised may also receive vaccine at local pharmacy or Health Dept. Verbalized acceptance and understanding.  Screening Tests Health Maintenance  Topic Date Due   Zoster Vaccines- Shingrix (1 of 2) Never done   OPHTHALMOLOGY EXAM  06/25/2019   HEMOGLOBIN A1C  09/28/2022   Diabetic kidney evaluation - Urine ACR  03/29/2023   Diabetic kidney evaluation - GFR measurement  04/05/2023   FOOT EXAM  04/05/2023   TETANUS/TDAP  09/01/2023   COLONOSCOPY (Pts 45-36yrs Insurance coverage will need to be confirmed)  12/09/2023   MAMMOGRAM  04/21/2024   Pneumonia Vaccine 49+ Years old  Completed   INFLUENZA VACCINE  Completed   DEXA SCAN  Completed   Hepatitis C Screening  Completed   HPV VACCINES  Aged Out   COVID-19 Vaccine  Discontinued    Health Maintenance  Health Maintenance Due  Topic Date Due   Zoster Vaccines- Shingrix (1 of 2) Never done   OPHTHALMOLOGY EXAM  06/25/2019    Colorectal cancer screening: Type of screening: Colonoscopy. Completed 2015. Repeat every 10 years  Mammogram status: Completed 2023. Repeat every year  Bone Density status: Completed 2021. Results reflect: Bone density results: NORMAL. Repeat every 2 years.  Lung Cancer Screening: (Low Dose CT Chest recommended if Age 73-80 years, 30 pack-year currently smoking OR have quit w/in 15years.) does not qualify.   Lung Cancer Screening Referral: na  Additional Screening:  Hepatitis C Screening: does qualify; Completed   Vision Screening: Recommended annual ophthalmology exams for early detection of glaucoma and other disorders of the eye. Is the patient up to date with their annual eye exam?  No  Who is the provider or what is the name of the office in which the patient attends annual eye exams? Doesn't have one If pt is not established with a provider,  would they like to be referred to a provider to establish care? Yes .   Dental Screening: Recommended annual dental exams for proper oral hygiene  Community Resource Referral / Chronic Care Management: CRR required this visit?  No   CCM required this visit?  No      Plan:     I have personally reviewed and noted the following in the patient's chart:   Medical and social history Use of alcohol, tobacco or illicit drugs  Current medications and supplements including opioid prescriptions. Patient is not currently taking opioid prescriptions. Functional ability and status Nutritional status Physical activity Advanced directives List of other physicians Hospitalizations, surgeries, and ER visits in previous 12 months Vitals Screenings to include cognitive, depression, and falls Referrals and appointments  In addition, I have reviewed and discussed with patient certain preventive protocols, quality metrics, and best practice recommendations. A written personalized care plan for preventive services as well as general preventive health recommendations were provided to patient.     Eual Fines, LPN   09/24/3565   Nurse Notes:

## 2022-06-05 ENCOUNTER — Other Ambulatory Visit: Payer: Self-pay | Admitting: Nurse Practitioner

## 2022-06-05 DIAGNOSIS — Z794 Long term (current) use of insulin: Secondary | ICD-10-CM

## 2022-06-19 ENCOUNTER — Other Ambulatory Visit: Payer: Self-pay

## 2022-06-19 DIAGNOSIS — Z794 Long term (current) use of insulin: Secondary | ICD-10-CM

## 2022-06-19 MED ORDER — LEVEMIR FLEXTOUCH 100 UNIT/ML ~~LOC~~ SOPN: PEN_INJECTOR | SUBCUTANEOUS | 3 refills | Status: DC

## 2022-07-11 DIAGNOSIS — I10 Essential (primary) hypertension: Secondary | ICD-10-CM | POA: Diagnosis not present

## 2022-07-12 LAB — BASIC METABOLIC PANEL
BUN/Creatinine Ratio: 19 (ref 12–28)
BUN: 18 mg/dL (ref 8–27)
CO2: 27 mmol/L (ref 20–29)
Calcium: 9.7 mg/dL (ref 8.7–10.3)
Chloride: 97 mmol/L (ref 96–106)
Creatinine, Ser: 0.96 mg/dL (ref 0.57–1.00)
Glucose: 127 mg/dL — ABNORMAL HIGH (ref 70–99)
Potassium: 4 mmol/L (ref 3.5–5.2)
Sodium: 138 mmol/L (ref 134–144)
eGFR: 62 mL/min/{1.73_m2} (ref >59)

## 2022-07-14 NOTE — Progress Notes (Signed)
Normal kidney function, calcium level is also normal. Follow up as planned

## 2022-07-15 ENCOUNTER — Ambulatory Visit: Payer: Medicare Other | Admitting: Internal Medicine

## 2022-07-17 ENCOUNTER — Ambulatory Visit (INDEPENDENT_AMBULATORY_CARE_PROVIDER_SITE_OTHER): Payer: Medicare Other | Admitting: Internal Medicine

## 2022-07-17 ENCOUNTER — Encounter: Payer: Self-pay | Admitting: Internal Medicine

## 2022-07-17 VITALS — BP 144/82 | HR 93 | Ht 67.0 in | Wt 218.8 lb

## 2022-07-17 DIAGNOSIS — I10 Essential (primary) hypertension: Secondary | ICD-10-CM

## 2022-07-17 DIAGNOSIS — Z794 Long term (current) use of insulin: Secondary | ICD-10-CM | POA: Diagnosis not present

## 2022-07-17 DIAGNOSIS — E1143 Type 2 diabetes mellitus with diabetic autonomic (poly)neuropathy: Secondary | ICD-10-CM

## 2022-07-17 MED ORDER — HYDROCHLOROTHIAZIDE 25 MG PO TABS: 25.0000 mg | ORAL_TABLET | Freq: Every day | ORAL | 2 refills | Status: DC

## 2022-07-17 MED ORDER — LEVEMIR FLEXTOUCH 100 UNIT/ML ~~LOC~~ SOPN: PEN_INJECTOR | SUBCUTANEOUS | 3 refills | Status: DC

## 2022-07-17 MED ORDER — HUMALOG KWIKPEN 100 UNIT/ML ~~LOC~~ SOPN: PEN_INJECTOR | SUBCUTANEOUS | 5 refills | Status: DC

## 2022-07-17 NOTE — Progress Notes (Signed)
Established Patient Office Visit  Subjective   Patient ID: Sue Wood, female    DOB: Oct 29, 1945  Age: 76 y.o. MRN: [id number]  Chief Complaint  Patient presents with   Follow-up    DM, HTN   Sue Wood returns to care today.  She is a 76 year old woman with a past medical history significant for HTN, T2DM, and neuropathy.  She was last seen at [facility] on 9/7 by Vena Rua, NP for diabetes follow-up.  No medication changes were made at that time a 6-monthfollow-up was arranged.  There have been no acute interval events.  Today Sue Wood states that she feels well.  She has no acute concerns to discuss.  Chronic medical conditions and outstanding preventative care items discussed today are individually addressed in A/P below.  Past Medical History:  Diagnosis Date   Arthritis    Diabetes mellitus    Hyperlipidemia    Hypertension    Peripheral neuropathy    Past Surgical History:  Procedure Laterality Date   ABDOMINAL HYSTERECTOMY     CESAREAN SECTION     X 2   COLONOSCOPY N/A 12/08/2013   Procedure: COLONOSCOPY;  Surgeon: NRogene Houston MD;  Location[facility];  Service: Endoscopy;  Laterality: N/A;  930   KNEE ARTHROSCOPY  1998   left   Social History   Tobacco Use   Smoking status: Former    Packs/day: 0.25    Years: 18.00    Total pack years: 4.50    Types: Cigarettes   Smokeless tobacco: Never   Tobacco comments:    quit 1980's  Substance Use Topics   Alcohol use: No   Drug use: No   Family History  Problem Relation Age of Onset   Hypertension Mother    Hyperlipidemia Mother    Hypertension Father    Hyperlipidemia Father    Diabetes Father    Hypertension Sister    Lung disease Sister    Heart disease Sister    Stroke Sister    Emphysema Brother    Diabetes Brother    Heart disease Brother    Colon cancer Neg Hx    Breast cancer Neg Hx    Allergies  Allergen Reactions   Lisinopril Swelling    Lip swelling    Review of  Systems  Constitutional:  Negative for chills and fever.  HENT:  Negative for sore throat.   Respiratory:  Negative for cough and shortness of breath.   Cardiovascular:  Negative for chest pain, palpitations and leg swelling.  Gastrointestinal:  Negative for abdominal pain, blood in stool, constipation, diarrhea, nausea and vomiting.  Genitourinary:  Negative for dysuria and hematuria.  Musculoskeletal:  Negative for myalgias.  Skin:  Negative for itching and rash.  Neurological:  Negative for dizziness and headaches.  Psychiatric/Behavioral:  Negative for depression and suicidal ideas.      Objective:     BP (!) 144/82   Pulse 93   Ht _0  (1.702 m)   Wt 218 lb 12.8 oz (99.2 kg)   SpO2 98%   BMI 34.27 kg/m  BP Readings from Last 3 Encounters:  07/17/22 (!) 144/82  05/15/22 138/70  04/04/22 138/78   Physical Exam Vitals reviewed.  Constitutional:      General: She is not in acute distress.    Appearance: Normal appearance. She is obese. She is not toxic-appearing.  HENT:     Head: Normocephalic and atraumatic.     Right  Ear: External ear normal.     Left Ear: External ear normal.     Nose: Nose normal. No congestion or rhinorrhea.     Mouth/Throat:     Mouth: Mucous membranes are moist.     Pharynx: Oropharynx is clear. No oropharyngeal exudate or posterior oropharyngeal erythema.  Eyes:     General: No scleral icterus.    Extraocular Movements: Extraocular movements intact.     Conjunctiva/sclera: Conjunctivae normal.     Pupils: Pupils are equal, round, and reactive to light.  Cardiovascular:     Rate and Rhythm: Normal rate and regular rhythm.     Pulses: Normal pulses.     Heart sounds: Normal heart sounds. No murmur heard.    No friction rub. No gallop.  Pulmonary:     Effort: Pulmonary effort is normal.     Breath sounds: Normal breath sounds. No wheezing, rhonchi or rales.  Abdominal:     General: Abdomen is flat. Bowel sounds are normal. There is no  distension.     Palpations: Abdomen is soft.     Tenderness: There is no abdominal tenderness.  Musculoskeletal:        General: No swelling. Normal range of motion.     Cervical back: Normal range of motion.     Right lower leg: No edema.     Left lower leg: No edema.  Lymphadenopathy:     Cervical: No cervical adenopathy.  Skin:    General: Skin is warm and dry.     Capillary Refill: Capillary refill takes less than 2 seconds.     Coloration: Skin is not jaundiced.  Neurological:     General: No focal deficit present.     Mental Status: She is alert and oriented to person, place, and time.  Psychiatric:        Mood and Affect: Mood normal.        Behavior: Behavior normal.    Last CBC Lab Results  Component Value Date   WBC 6.9 03/28/2022   HGB 12.1 03/28/2022   HCT 37.8 03/28/2022   MCV 87 03/28/2022   MCH 27.9 03/28/2022   RDW 13.7 03/28/2022   PLT 224 17/00/1749   Last metabolic panel Lab Results  Component Value Date   GLUCOSE 127 (H) 07/11/2022   NA 138 07/11/2022   K 4.0 07/11/2022   CL 97 07/11/2022   CO2 27 07/11/2022   BUN 18 07/11/2022   CREATININE 0.96 07/11/2022   EGFR 62 07/11/2022   CALCIUM 9.7 07/11/2022   PROT 7.8 04/04/2022   ALBUMIN 4.3 04/04/2022   LABGLOB 3.5 04/04/2022   AGRATIO 1.2 04/04/2022   BILITOT <0.2 04/04/2022   ALKPHOS 85 04/04/2022   AST 23 04/04/2022   ALT 25 04/04/2022   Last lipids Lab Results  Component Value Date   CHOL 134 03/28/2022   HDL 51 03/28/2022   LDLCALC 67 03/28/2022   TRIG 82 03/28/2022   CHOLHDL 2.6 03/28/2022   Last hemoglobin A1c Lab Results  Component Value Date   HGBA1C 9.7 (H) 03/28/2022   Last thyroid functions Lab Results  Component Value Date   TSH 2.360 04/04/2022   Last vitamin D Lab Results  Component Value Date   VD25OH 41.6 04/04/2022   The 10-year ASCVD risk score (Arnett DK, et al., 2019) is: 26.3%    Assessment & Plan:   Problem List Items Addressed This Visit        Essential hypertension, benign - Primary  BP 150/78 initially, 144/82 on repeat.  She is currently prescribed amlodipine 10 mg daily and HCTZ 12.5 mg daily. -Increase HCTZ to 25 mg daily.  Continue amlodipine at current dose. -Follow-up in 2 weeks for BP check      Diabetes mellitus with neurological manifestation (HCC)    A1c 9.7 in July.  Her current diabetes regimen includes Levemir 36 units twice daily, Humalog 12 units 3 times daily with meals, and Jardiance 25 mg daily.  She is checking her blood sugar at home and reports readings ranging low 100s-299.  She currently denies symptoms of polyuria and polydipsia. -No medication changes for now.  Ideally would like to start [facility] for improved glycemic control.  I have asked her to research this over the next 2 weeks and we can discuss starting therapy at follow-up. -Plan to check POC A1c at follow-up in 2 weeks      Return in about 2 weeks (around 07/31/2022) for HTN, DM.    Johnette Abraham, MD

## 2022-07-17 NOTE — Patient Instructions (Signed)
It was a pleasure to see you today.  Thank you for giving [place] the opportunity to be involved in your care.  Below is a brief recap of your visit and next steps.  We will plan to see you again in 2-4 weeks.  Summary We have increase HCTZ to 25 mg daily to improve blood pressure control We will follow up in 2 weeks for BP check and to talk about adding an additional medication for diabetes

## 2022-07-23 NOTE — Assessment & Plan Note (Signed)
A1c 9.7 in July.  Her current diabetes regimen includes Levemir 36 units twice daily, Humalog 12 units 3 times daily with meals, and Jardiance 25 mg daily.  She is checking her blood sugar at home and reports readings ranging low 100s-299.  She currently denies symptoms of polyuria and polydipsia. -No medication changes for now.  Ideally would like to start [facility] for improved glycemic control.  I have asked her to research this over the next 2 weeks and we can discuss starting therapy at follow-up.

## 2022-07-23 NOTE — Assessment & Plan Note (Signed)
BP 150/78 initially, 144/82 on repeat.  She is currently prescribed amlodipine 10 mg daily and HCTZ 12.5 mg daily. -Increase HCTZ to 25 mg daily.  Continue amlodipine at current dose. -Follow-up in 2 weeks for BP check

## 2022-08-08 ENCOUNTER — Ambulatory Visit (INDEPENDENT_AMBULATORY_CARE_PROVIDER_SITE_OTHER): Payer: Medicare Other | Admitting: Internal Medicine

## 2022-08-08 ENCOUNTER — Encounter: Payer: Self-pay | Admitting: Internal Medicine

## 2022-08-08 VITALS — BP 148/84 | HR 89 | Ht 67.0 in | Wt 208.8 lb

## 2022-08-08 DIAGNOSIS — E0840 Diabetes mellitus due to underlying condition with diabetic neuropathy, unspecified: Secondary | ICD-10-CM

## 2022-08-08 DIAGNOSIS — I10 Essential (primary) hypertension: Secondary | ICD-10-CM | POA: Diagnosis not present

## 2022-08-08 DIAGNOSIS — Z794 Long term (current) use of insulin: Secondary | ICD-10-CM

## 2022-08-08 MED ORDER — TIRZEPATIDE 2.5 MG/0.5ML ~~LOC~~ SOAJ: 2.5000 mg | SUBCUTANEOUS | 0 refills | Status: AC

## 2022-08-08 MED ORDER — NORTRIPTYLINE HCL 25 MG PO CAPS: 25.0000 mg | ORAL_CAPSULE | Freq: Every day | ORAL | 3 refills | Status: AC

## 2022-08-08 MED ORDER — AMLODIPINE-OLMESARTAN 10-20 MG PO TABS: 1.0000 | ORAL_TABLET | Freq: Every day | ORAL | 2 refills | Status: DC

## 2022-08-08 NOTE — Assessment & Plan Note (Signed)
A1c 9.7 in July.  She is currently prescribed Jardiance 25 mg daily, Levemir 36 units twice daily and Humalog 12 units 3 times daily with meals.  She continues to report blood sugar readings ranging 100-299.  She has previously been intolerant of metformin -Repeat A1c ordered today -Start Mounjaro 2.5 mg weekly injections today -Follow-up in 4 weeks to assess progress

## 2022-08-08 NOTE — Patient Instructions (Signed)
It was a pleasure to see you today.  Thank you for giving [place] the opportunity to be involved in your care.  Below is a brief recap of your visit and next steps.  We will plan to see you again in 4 weeks.  Summary Start amlodipine-olmesartn 10-20 mg daily today Discontinue amlodipine 10 mg Start Mounjaro 2.5 mg weekly Repeat labs today Follow up in 4 weeks

## 2022-08-08 NOTE — Progress Notes (Signed)
Established Patient Office Visit  Subjective   Patient ID: Sue Wood, female    DOB: 04/19/1946  Age: 76 y.o. MRN: [id number]  Chief Complaint  Patient presents with   Follow-up    HTN, DM   Sue Wood returns to care today.  She was last seen by me she was last seen by me on 11/9 at which time her blood pressure was elevated and HCTZ was increased to 25 mg daily.  2-week follow-up was arranged.  There have been no acute interval events.  Today Sue Wood states that she feels well.  She has no acute concerns to discuss.  Past Medical History:  Diagnosis Date   Arthritis    Diabetes mellitus    Hyperlipidemia    Hypertension    Peripheral neuropathy    Past Surgical History:  Procedure Laterality Date   ABDOMINAL HYSTERECTOMY     CESAREAN SECTION     X 2   COLONOSCOPY N/A 12/08/2013   Procedure: COLONOSCOPY;  Surgeon: Rogene Houston, MD;  Location: [facility];  Service: Endoscopy;  Laterality: N/A;  930   KNEE ARTHROSCOPY  1998   left   Social History   Tobacco Use   Smoking status: Former    Packs/day: 0.25    Years: 18.00    Total pack years: 4.50    Types: Cigarettes   Smokeless tobacco: Never   Tobacco comments:    quit 1980's  Substance Use Topics   Alcohol use: No   Drug use: No   Family History  Problem Relation Age of Onset   Hypertension Mother    Hyperlipidemia Mother    Hypertension Father    Hyperlipidemia Father    Diabetes Father    Hypertension Sister    Lung disease Sister    Heart disease Sister    Stroke Sister    Emphysema Brother    Diabetes Brother    Heart disease Brother    Colon cancer Neg Hx    Breast cancer Neg Hx    Allergies  Allergen Reactions   Lisinopril Swelling    Lip swelling    Review of Systems  Constitutional:  Negative for chills and fever.  HENT:  Negative for sore throat.   Respiratory:  Negative for cough and shortness of breath.   Cardiovascular:  Negative for chest pain, palpitations  and leg swelling.  Gastrointestinal:  Negative for abdominal pain, blood in stool, constipation, diarrhea, nausea and vomiting.  Genitourinary:  Negative for dysuria and hematuria.  Musculoskeletal:  Negative for myalgias.  Skin:  Negative for itching and rash.  Neurological:  Negative for dizziness and headaches.  Psychiatric/Behavioral:  Negative for depression and suicidal ideas.      Objective:     BP (!) 148/84   Pulse 89   Ht _0  (1.702 m)   Wt 208 lb 12.8 oz (94.7 kg)   SpO2 97%   BMI 32.70 kg/m  BP Readings from Last 3 Encounters:  08/08/22 (!) 148/84  07/17/22 (!) 144/82  05/15/22 138/70   Physical Exam Vitals reviewed.  Constitutional:      General: She is not in acute distress.    Appearance: Normal appearance. She is obese. She is not toxic-appearing.  HENT:     Head: Normocephalic and atraumatic.     Right Ear: External ear normal.     Left Ear: External ear normal.     Nose: Nose normal. No congestion or rhinorrhea.  Mouth/Throat:     Mouth: Mucous membranes are moist.     Pharynx: Oropharynx is clear. No oropharyngeal exudate or posterior oropharyngeal erythema.  Eyes:     General: No scleral icterus.    Extraocular Movements: Extraocular movements intact.     Conjunctiva/sclera: Conjunctivae normal.     Pupils: Pupils are equal, round, and reactive to light.  Cardiovascular:     Rate and Rhythm: Normal rate and regular rhythm.     Pulses: Normal pulses.     Heart sounds: Normal heart sounds. No murmur heard.    No friction rub. No gallop.  Pulmonary:     Effort: Pulmonary effort is normal.     Breath sounds: Normal breath sounds. No wheezing, rhonchi or rales.  Abdominal:     General: Abdomen is flat. Bowel sounds are normal. There is no distension.     Palpations: Abdomen is soft.     Tenderness: There is no abdominal tenderness.  Musculoskeletal:        General: No swelling. Normal range of motion.     Cervical back: Normal range of  motion.     Right lower leg: No edema.     Left lower leg: No edema.  Lymphadenopathy:     Cervical: No cervical adenopathy.  Skin:    General: Skin is warm and dry.     Capillary Refill: Capillary refill takes less than 2 seconds.     Coloration: Skin is not jaundiced.  Neurological:     General: No focal deficit present.     Mental Status: She is alert and oriented to person, place, and time.  Psychiatric:        Mood and Affect: Mood normal.        Behavior: Behavior normal.    Last CBC Lab Results  Component Value Date   WBC 6.9 03/28/2022   HGB 12.1 03/28/2022   HCT 37.8 03/28/2022   MCV 87 03/28/2022   MCH 27.9 03/28/2022   RDW 13.7 03/28/2022   PLT 224 01/74/9449   Last metabolic panel Lab Results  Component Value Date   GLUCOSE 127 (H) 07/11/2022   NA 138 07/11/2022   K 4.0 07/11/2022   CL 97 07/11/2022   CO2 27 07/11/2022   BUN 18 07/11/2022   CREATININE 0.96 07/11/2022   EGFR 62 07/11/2022   CALCIUM 9.7 07/11/2022   PROT 7.8 04/04/2022   ALBUMIN 4.3 04/04/2022   LABGLOB 3.5 04/04/2022   AGRATIO 1.2 04/04/2022   BILITOT <0.2 04/04/2022   ALKPHOS 85 04/04/2022   AST 23 04/04/2022   ALT 25 04/04/2022   Last lipids Lab Results  Component Value Date   CHOL 134 03/28/2022   HDL 51 03/28/2022   LDLCALC 67 03/28/2022   TRIG 82 03/28/2022   CHOLHDL 2.6 03/28/2022   Last hemoglobin A1c Lab Results  Component Value Date   HGBA1C 9.7 (H) 03/28/2022   Last thyroid functions Lab Results  Component Value Date   TSH 2.360 04/04/2022   Last vitamin D Lab Results  Component Value Date   VD25OH 41.6 04/04/2022   The 10-year ASCVD risk score (Arnett DK, et al., 2019) is: 27.3%    Assessment & Plan:   Problem List Items Addressed This Visit       Essential hypertension, benign - Primary    She is currently prescribed amlodipine 10 mg daily and HCTZ 25 mg daily for treatment of hypertension.  HCTZ was increased to 25 mg daily at her  last  appointment.  Her blood pressure today remains elevated, 152/81.  She reports taking her medications as prescribed.  She has occasionally checked her blood pressure at home and reports systolic readings in the 517O. -Start amlodipine-olmesartan 10-20 mg daily -Discontinue amlodipine -Continue HCTZ -Follow-up in 4 weeks for BP check -Repeat BMP ordered today given recent increase in HCTZ      Diabetes mellitus with neurological manifestation (HCC)    A1c 9.7 in July.  She is currently prescribed Jardiance 25 mg daily, Levemir 36 units twice daily and Humalog 12 units 3 times daily with meals.  She continues to report blood sugar readings ranging 100-299.  She has previously been intolerant of metformin -Repeat A1c ordered today -Start Mounjaro 2.5 mg weekly injections today -Follow-up in 4 weeks to assess progress       Return in about 4 weeks (around 09/05/2022).    Johnette Abraham, MD

## 2022-08-08 NOTE — Assessment & Plan Note (Addendum)
She is currently prescribed amlodipine 10 mg daily and HCTZ 25 mg daily for treatment of hypertension.  HCTZ was increased to 25 mg daily at her last appointment.  Her blood pressure today remains elevated, 152/81.  She reports taking her medications as prescribed.  She has occasionally checked her blood pressure at home and reports systolic readings in the 811W. -Start amlodipine-olmesartan 10-20 mg daily -Discontinue amlodipine -Continue HCTZ -Follow-up in 4 weeks for BP check -Repeat BMP ordered today given recent increase in HCTZ

## 2022-08-09 LAB — HEMOGLOBIN A1C
Est. average glucose Bld gHb Est-mCnc: 194 mg/dL
Hgb A1c MFr Bld: 8.4 % — ABNORMAL HIGH (ref 4.8–5.6)

## 2022-08-09 LAB — BASIC METABOLIC PANEL WITH GFR
BUN/Creatinine Ratio: 17 (ref 12–28)
BUN: 17 mg/dL (ref 8–27)
CO2: 22 mmol/L (ref 20–29)
Calcium: 9.7 mg/dL (ref 8.7–10.3)
Chloride: 100 mmol/L (ref 96–106)
Creatinine, Ser: 0.99 mg/dL (ref 0.57–1.00)
Glucose: 274 mg/dL — ABNORMAL HIGH (ref 70–99)
Potassium: 3.9 mmol/L (ref 3.5–5.2)
Sodium: 138 mmol/L (ref 134–144)
eGFR: 60 mL/min/1.73 (ref >59)

## 2022-08-27 ENCOUNTER — Other Ambulatory Visit: Payer: Self-pay | Admitting: Internal Medicine

## 2022-08-27 DIAGNOSIS — Z794 Long term (current) use of insulin: Secondary | ICD-10-CM

## 2022-09-10 ENCOUNTER — Ambulatory Visit (INDEPENDENT_AMBULATORY_CARE_PROVIDER_SITE_OTHER): Payer: Medicare Other | Admitting: Internal Medicine

## 2022-09-10 ENCOUNTER — Encounter: Payer: Self-pay | Admitting: Internal Medicine

## 2022-09-10 VITALS — BP 128/74 | HR 91 | Ht 67.0 in | Wt 218.8 lb

## 2022-09-10 DIAGNOSIS — Z794 Long term (current) use of insulin: Secondary | ICD-10-CM | POA: Diagnosis not present

## 2022-09-10 DIAGNOSIS — E0840 Diabetes mellitus due to underlying condition with diabetic neuropathy, unspecified: Secondary | ICD-10-CM

## 2022-09-10 DIAGNOSIS — I10 Essential (primary) hypertension: Secondary | ICD-10-CM

## 2022-09-10 MED ORDER — TIRZEPATIDE 5 MG/0.5ML ~~LOC~~ SOAJ: 5.0000 mg | SUBCUTANEOUS | 0 refills | Status: DC

## 2022-09-10 NOTE — Progress Notes (Signed)
Established Patient Office Visit  Subjective   Patient ID: Sue Wood, female    DOB: 04-Feb-1946  Age: 77 y.o. MRN: [id number]  Chief Complaint  Patient presents with   Hypertension    Follow up   Sue Wood returns to care today for HTN and DM follow-up.  She was last seen by me on 12/1 at which time amlodipine-olmesartan 10-20 mg daily was started for improved treatment of hypertension.  Sue Wood was also started for improved treatment of diabetes.  4-week follow-up was arranged.  There have been no acute interval events.  Today Sue Wood states that she feels well.  She is asymptomatic and has no additional concerns to discuss.  Past Medical History:  Diagnosis Date   Arthritis    Diabetes mellitus    Hyperlipidemia    Hypertension    Peripheral neuropathy    Past Surgical History:  Procedure Laterality Date   ABDOMINAL HYSTERECTOMY     CESAREAN SECTION     X 2   COLONOSCOPY N/A 12/08/2013   Procedure: COLONOSCOPY;  Surgeon: Rogene Houston, MD;  Location: [facility];  Service: Endoscopy;  Laterality: N/A;  930   KNEE ARTHROSCOPY  1998   left   Social History   Tobacco Use   Smoking status: Former    Packs/day: 0.25    Years: 18.00    Total pack years: 4.50    Types: Cigarettes   Smokeless tobacco: Never   Tobacco comments:    quit 1980's  Substance Use Topics   Alcohol use: No   Drug use: No   Family History  Problem Relation Age of Onset   Hypertension Mother    Hyperlipidemia Mother    Hypertension Father    Hyperlipidemia Father    Diabetes Father    Hypertension Sister    Lung disease Sister    Heart disease Sister    Stroke Sister    Emphysema Brother    Diabetes Brother    Heart disease Brother    Colon cancer Neg Hx    Breast cancer Neg Hx    Allergies  Allergen Reactions   Lisinopril Swelling    Lip swelling       Review of Systems  Constitutional:  Negative for chills and fever.  HENT:  Negative for sore throat.    Respiratory:  Negative for cough and shortness of breath.   Cardiovascular:  Negative for chest pain, palpitations and leg swelling.  Gastrointestinal:  Negative for abdominal pain, blood in stool, constipation, diarrhea, nausea and vomiting.  Genitourinary:  Negative for dysuria and hematuria.  Musculoskeletal:  Negative for myalgias.  Skin:  Negative for itching and rash.  Neurological:  Negative for dizziness and headaches.  Psychiatric/Behavioral:  Negative for depression and suicidal ideas.      Objective:     BP 128/74   Pulse 91   Ht _0  (1.702 m)   Wt 218 lb 12.8 oz (99.2 kg)   SpO2 97%   BMI 34.27 kg/m  BP Readings from Last 3 Encounters:  09/10/22 128/74  08/08/22 (!) 148/84  07/17/22 (!) 144/82   Physical Exam Vitals reviewed.  Constitutional:      General: She is not in acute distress.    Appearance: Normal appearance. She is obese. She is not toxic-appearing.  HENT:     Head: Normocephalic and atraumatic.     Right Ear: External ear normal.     Left Ear: External ear normal.  Nose: Nose normal. No congestion or rhinorrhea.     Mouth/Throat:     Mouth: Mucous membranes are moist.     Pharynx: Oropharynx is clear. No oropharyngeal exudate or posterior oropharyngeal erythema.  Eyes:     General: No scleral icterus.    Extraocular Movements: Extraocular movements intact.     Conjunctiva/sclera: Conjunctivae normal.     Pupils: Pupils are equal, round, and reactive to light.  Cardiovascular:     Rate and Rhythm: Normal rate and regular rhythm.     Pulses: Normal pulses.     Heart sounds: Normal heart sounds. No murmur heard.    No friction rub. No gallop.  Pulmonary:     Effort: Pulmonary effort is normal.     Breath sounds: Normal breath sounds. No wheezing, rhonchi or rales.  Abdominal:     General: Abdomen is flat. Bowel sounds are normal. There is no distension.     Palpations: Abdomen is soft.     Tenderness: There is no abdominal tenderness.   Musculoskeletal:        General: No swelling. Normal range of motion.     Cervical back: Normal range of motion.     Right lower leg: No edema.     Left lower leg: No edema.  Lymphadenopathy:     Cervical: No cervical adenopathy.  Skin:    General: Skin is warm and dry.     Capillary Refill: Capillary refill takes less than 2 seconds.     Coloration: Skin is not jaundiced.  Neurological:     General: No focal deficit present.     Mental Status: She is alert and oriented to person, place, and time.  Psychiatric:        Mood and Affect: Mood normal.        Behavior: Behavior normal.    Last CBC Lab Results  Component Value Date   WBC 6.9 03/28/2022   HGB 12.1 03/28/2022   HCT 37.8 03/28/2022   MCV 87 03/28/2022   MCH 27.9 03/28/2022   RDW 13.7 03/28/2022   PLT 224 07/37/1062   Last metabolic panel Lab Results  Component Value Date   GLUCOSE 274 (H) 08/08/2022   NA 138 08/08/2022   K 3.9 08/08/2022   CL 100 08/08/2022   CO2 22 08/08/2022   BUN 17 08/08/2022   CREATININE 0.99 08/08/2022   EGFR 60 08/08/2022   CALCIUM 9.7 08/08/2022   PROT 7.8 04/04/2022   ALBUMIN 4.3 04/04/2022   LABGLOB 3.5 04/04/2022   AGRATIO 1.2 04/04/2022   BILITOT <0.2 04/04/2022   ALKPHOS 85 04/04/2022   AST 23 04/04/2022   ALT 25 04/04/2022   Last lipids Lab Results  Component Value Date   CHOL 134 03/28/2022   HDL 51 03/28/2022   LDLCALC 67 03/28/2022   TRIG 82 03/28/2022   CHOLHDL 2.6 03/28/2022   Last hemoglobin A1c Lab Results  Component Value Date   HGBA1C 8.4 (H) 08/08/2022   Last thyroid functions Lab Results  Component Value Date   TSH 2.360 04/04/2022   Last vitamin D Lab Results  Component Value Date   VD25OH 41.6 04/04/2022   The 10-year ASCVD risk score (Arnett DK, et al., 2019) is: 22.4%    Assessment & Plan:   Problem List Items Addressed This Visit       Essential hypertension, benign    Amlodipine-olmesartan 10-20 mg daily was started at her  last appointment for improved treatment of hypertension.  She  is also prescribed HCTZ 25 mg daily.  Her blood pressure today is 128/74.  She has been checking her blood pressure at home and reports readings around 120/70. -No additional changes today.  Continue current antihypertensive regimen.      Diabetes mellitus with neurological manifestation (HCC) - Primary    A1c 8.4 last month.  She is currently prescribed Levemir 36 units twice daily, Humalog 12 units 3 times daily with meals, Jardiance 25 mg daily, and Mounjaro 2.5 mg weekly.  Sue Wood was started at her last appointment.  She reports AM blood sugar readings ranging 100-150s.  She has not experienced any adverse side effects since starting Mounjaro. -Increase Mounjaro to 5 mg weekly -Continue current insulin regimen -We will plan for follow-up in 4 weeks.  As Mounjaro was uptitrated and her blood sugars improve, we will attempt to decrease her insulin regimen.       Return in about 4 weeks (around 10/08/2022) for DM.    Johnette Abraham, MD

## 2022-09-10 NOTE — Patient Instructions (Signed)
It was a pleasure to see you today.  Thank you for giving [place] the opportunity to be involved in your care.  Below is a brief recap of your visit and next steps.  We will plan to see you again in 4 weeks.  Summary I am pleased to see things are improving.  We will increase Mounjaro to 5 mg weekly Continue your other medications as prescribed.  We will follow up in 4 weeks to review your blood sugars and can hopefully start backing off on insulin.

## 2022-09-15 NOTE — Assessment & Plan Note (Signed)
A1c 8.4 last month.  She is currently prescribed Levemir 36 units twice daily, Humalog 12 units 3 times daily with meals, Jardiance 25 mg daily, and Mounjaro 2.5 mg weekly.  [name] was started at her last appointment.  She reports AM blood sugar readings ranging 100-150s.  She has not experienced any adverse side effects since starting Mounjaro. -Increase Mounjaro to 5 mg weekly -Continue current insulin regimen -We will plan for follow-up in 4 weeks.  As Mounjaro was uptitrated and her blood sugars improve, we will attempt to decrease her insulin regimen.

## 2022-09-15 NOTE — Assessment & Plan Note (Signed)
Amlodipine-olmesartan 10-20 mg daily was started at her last appointment for improved treatment of hypertension.  She is also prescribed HCTZ 25 mg daily.  Her blood pressure today is 128/74.  She has been checking her blood pressure at home and reports readings around 120/70. -No additional changes today.  Continue current antihypertensive regimen.

## 2022-09-17 ENCOUNTER — Other Ambulatory Visit: Payer: Self-pay | Admitting: Internal Medicine

## 2022-09-17 DIAGNOSIS — I10 Essential (primary) hypertension: Secondary | ICD-10-CM

## 2022-10-05 ENCOUNTER — Other Ambulatory Visit: Payer: Self-pay | Admitting: Internal Medicine

## 2022-10-05 DIAGNOSIS — E1143 Type 2 diabetes mellitus with diabetic autonomic (poly)neuropathy: Secondary | ICD-10-CM

## 2022-10-08 ENCOUNTER — Encounter: Payer: Self-pay | Admitting: Internal Medicine

## 2022-10-08 ENCOUNTER — Ambulatory Visit (INDEPENDENT_AMBULATORY_CARE_PROVIDER_SITE_OTHER): Payer: Medicare Other | Admitting: Internal Medicine

## 2022-10-08 VITALS — BP 138/79 | HR 91 | Ht 67.0 in | Wt 219.2 lb

## 2022-10-08 DIAGNOSIS — E0840 Diabetes mellitus due to underlying condition with diabetic neuropathy, unspecified: Secondary | ICD-10-CM

## 2022-10-08 DIAGNOSIS — Z794 Long term (current) use of insulin: Secondary | ICD-10-CM

## 2022-10-08 MED ORDER — TIRZEPATIDE 7.5 MG/0.5ML ~~LOC~~ SOAJ: 7.5000 mg | SUBCUTANEOUS | 0 refills | Status: DC

## 2022-10-08 NOTE — Progress Notes (Signed)
Established Patient Office Visit  Subjective   Patient ID: Sue Wood, female    DOB: 02-May-1946  Age: 77 y.o. MRN: [id number]  Chief Complaint  Patient presents with   Diabetes    Follow up   Sue Wood returns to care today for DM follow-up.  She was last seen by me on 09/10/22 at which time [name] was increased to 5 mg weekly.  4-week follow-up was arranged to review her current insulin regimen and make appropriate titrations.  There have been no acute interval events since her last appointment.  Sue Wood reports feeling well today.  She is asymptomatic and has no acute concerns to discuss.  She has been checking her blood sugar 5 times daily when she injects insulin.  Her blood sugars mostly range 130-160.  She has not experienced any adverse side effects with increasing Mounjaro.  Past Medical History:  Diagnosis Date   Arthritis    Diabetes mellitus    Hyperlipidemia    Hypertension    Peripheral neuropathy    Past Surgical History:  Procedure Laterality Date   ABDOMINAL HYSTERECTOMY     CESAREAN SECTION     X 2   COLONOSCOPY N/A 12/08/2013   Procedure: COLONOSCOPY;  Surgeon: Rogene Houston, MD;  Location: [facility];  Service: Endoscopy;  Laterality: N/A;  930   KNEE ARTHROSCOPY  1998   left   Social History   Tobacco Use   Smoking status: Former    Packs/day: 0.25    Years: 18.00    Total pack years: 4.50    Types: Cigarettes   Smokeless tobacco: Never   Tobacco comments:    quit 1980's  Substance Use Topics   Alcohol use: No   Drug use: No   Family History  Problem Relation Age of Onset   Hypertension Mother    Hyperlipidemia Mother    Hypertension Father    Hyperlipidemia Father    Diabetes Father    Hypertension Sister    Lung disease Sister    Heart disease Sister    Stroke Sister    Emphysema Brother    Diabetes Brother    Heart disease Brother    Colon cancer Neg Hx    Breast cancer Neg Hx    Allergies  Allergen Reactions    Lisinopril Swelling    Lip swelling    Review of Systems  Constitutional:  Negative for chills and fever.  HENT:  Negative for sore throat.   Respiratory:  Negative for cough and shortness of breath.   Cardiovascular:  Negative for chest pain, palpitations and leg swelling.  Gastrointestinal:  Negative for abdominal pain, blood in stool, constipation, diarrhea, nausea and vomiting.  Genitourinary:  Negative for dysuria and hematuria.  Musculoskeletal:  Negative for myalgias.  Skin:  Negative for itching and rash.  Neurological:  Negative for dizziness and headaches.  Psychiatric/Behavioral:  Negative for depression and suicidal ideas.      Objective:     BP 138/79   Pulse 91   Ht '5\' 7"'$  (1.702 m)   Wt 219 lb 3.2 oz (99.4 kg)   SpO2 97%   BMI 34.33 kg/m  BP Readings from Last 3 Encounters:  10/08/22 138/79  09/10/22 128/74  08/08/22 (!) 148/84   Physical Exam Vitals reviewed.  Constitutional:      General: She is not in acute distress.    Appearance: Normal appearance. She is obese. She is not toxic-appearing.  HENT:  Head: Normocephalic and atraumatic.     Right Ear: External ear normal.     Left Ear: External ear normal.     Nose: Nose normal. No congestion or rhinorrhea.     Mouth/Throat:     Mouth: Mucous membranes are moist.     Pharynx: Oropharynx is clear. No oropharyngeal exudate or posterior oropharyngeal erythema.  Eyes:     General: No scleral icterus.    Extraocular Movements: Extraocular movements intact.     Conjunctiva/sclera: Conjunctivae normal.     Pupils: Pupils are equal, round, and reactive to light.  Cardiovascular:     Rate and Rhythm: Normal rate and regular rhythm.     Pulses: Normal pulses.     Heart sounds: Normal heart sounds. No murmur heard.    No friction rub. No gallop.  Pulmonary:     Effort: Pulmonary effort is normal.     Breath sounds: Normal breath sounds. No wheezing, rhonchi or rales.  Abdominal:     General:  Abdomen is flat. Bowel sounds are normal. There is no distension.     Palpations: Abdomen is soft.     Tenderness: There is no abdominal tenderness.  Musculoskeletal:        General: No swelling. Normal range of motion.     Cervical back: Normal range of motion.     Right lower leg: No edema.     Left lower leg: No edema.  Lymphadenopathy:     Cervical: No cervical adenopathy.  Skin:    General: Skin is warm and dry.     Capillary Refill: Capillary refill takes less than 2 seconds.     Coloration: Skin is not jaundiced.  Neurological:     General: No focal deficit present.     Mental Status: She is alert and oriented to person, place, and time.  Psychiatric:        Mood and Affect: Mood normal.        Behavior: Behavior normal.   Last CBC Lab Results  Component Value Date   WBC 6.9 03/28/2022   HGB 12.1 03/28/2022   HCT 37.8 03/28/2022   MCV 87 03/28/2022   MCH 27.9 03/28/2022   RDW 13.7 03/28/2022   PLT 224 60/63/0160   Last metabolic panel Lab Results  Component Value Date   GLUCOSE 274 (H) 08/08/2022   NA 138 08/08/2022   K 3.9 08/08/2022   CL 100 08/08/2022   CO2 22 08/08/2022   BUN 17 08/08/2022   CREATININE 0.99 08/08/2022   EGFR 60 08/08/2022   CALCIUM 9.7 08/08/2022   PROT 7.8 04/04/2022   ALBUMIN 4.3 04/04/2022   LABGLOB 3.5 04/04/2022   AGRATIO 1.2 04/04/2022   BILITOT <0.2 04/04/2022   ALKPHOS 85 04/04/2022   AST 23 04/04/2022   ALT 25 04/04/2022   Last lipids Lab Results  Component Value Date   CHOL 134 03/28/2022   HDL 51 03/28/2022   LDLCALC 67 03/28/2022   TRIG 82 03/28/2022   CHOLHDL 2.6 03/28/2022   Last hemoglobin A1c Lab Results  Component Value Date   HGBA1C 8.4 (H) 08/08/2022   Last thyroid functions Lab Results  Component Value Date   TSH 2.360 04/04/2022   Last vitamin D Lab Results  Component Value Date   VD25OH 41.6 04/04/2022   The 10-year ASCVD risk score (Arnett DK, et al., 2019) is: 24.8%    Assessment &  Plan:   Problem List Items Addressed This Visit  Diabetes mellitus with neurological manifestation ([place]) - Primary    Presenting today for DM follow-up.  Last seen by me on 1/3 at which time [name] was increased to 5 mg weekly.  She remains on Levemir 36 units twice daily, Humalog 12 units 3 times daily with meals, and Jardiance 25 mg daily.  On review of her recent blood sugar readings today, her range is 130-160. -Increase Mounjaro to 7.5 mg weekly -Continue current insulin regimen -We will plan for follow-up in 1 month.  She has been provided with a blood sugar log today.      Return in about 1 month (around 11/06/2022) for DM, HTN.   Johnette Abraham, MD

## 2022-10-08 NOTE — Patient Instructions (Signed)
It was a pleasure to see you today.  Thank you for giving [place] the opportunity to be involved in your care.  Below is a brief recap of your visit and next steps.  We will plan to see you again in 1 month.  Summary Increase Mounjaro to 7.5 mg weekly - start this after your 4th injection of 5 mg No changes to your insulin regimen today We will give you a blood sugar log to records your sugars Follow up in 1 month

## 2022-10-08 NOTE — Assessment & Plan Note (Signed)
Presenting today for DM follow-up.  Last seen by me on 1/3 at which time Sue Wood was increased to 5 mg weekly.  She remains on Levemir 36 units twice daily, Humalog 12 units 3 times daily with meals, and Jardiance 25 mg daily.  On review of her recent blood sugar readings today, her range is 130-160. -Increase Mounjaro to 7.5 mg weekly -Continue current insulin regimen -We will plan for follow-up in 1 month.  She has been provided with a blood sugar log today.

## 2022-10-09 ENCOUNTER — Other Ambulatory Visit: Payer: Self-pay | Admitting: Internal Medicine

## 2022-10-09 DIAGNOSIS — I10 Essential (primary) hypertension: Secondary | ICD-10-CM

## 2022-10-27 ENCOUNTER — Other Ambulatory Visit: Payer: Self-pay | Admitting: Internal Medicine

## 2022-10-27 DIAGNOSIS — Z794 Long term (current) use of insulin: Secondary | ICD-10-CM

## 2022-11-19 ENCOUNTER — Encounter: Payer: Self-pay | Admitting: Internal Medicine

## 2022-11-19 ENCOUNTER — Ambulatory Visit (INDEPENDENT_AMBULATORY_CARE_PROVIDER_SITE_OTHER): Payer: Medicare Other | Admitting: Internal Medicine

## 2022-11-19 VITALS — BP 123/75 | HR 80 | Ht 67.0 in | Wt 220.2 lb

## 2022-11-19 DIAGNOSIS — Z794 Long term (current) use of insulin: Secondary | ICD-10-CM

## 2022-11-19 DIAGNOSIS — E0840 Diabetes mellitus due to underlying condition with diabetic neuropathy, unspecified: Secondary | ICD-10-CM | POA: Diagnosis not present

## 2022-11-19 LAB — POCT GLYCOSYLATED HEMOGLOBIN (HGB A1C): HbA1c, POC (controlled diabetic range): 6.7 % (ref 0.0–7.0)

## 2022-11-19 MED ORDER — TIRZEPATIDE 10 MG/0.5ML ~~LOC~~ SOAJ: 10.0000 mg | SUBCUTANEOUS | 1 refills | Status: DC

## 2022-11-19 NOTE — Assessment & Plan Note (Addendum)
Presenting today for DM follow-up.  Mounjaro was increased to 7.5 mg weekly on 1/31. She has been checking her blood sugar 3 times daily.  There are no readings > 200.  She has a low reading of 79.  She remains on Jardiance 25 mg daily, Levemir 36 units twice daily, and Humalog 12 units 3 times daily with meals.  POC A1c today has improved to 6.7 from 8.4 previously. -She was congratulated on her progress in improving glycemic control. -Increase Mounjaro to 10 mg weekly -We discussed gradually reducing Humalog.  I recommended decreasing Humalog by 4 units if her pre-meal blood sugar reading is < 180 -We will tentatively plan for follow-up in 6 weeks

## 2022-11-19 NOTE — Patient Instructions (Signed)
It was a pleasure to see you today.  Thank you for giving [place] the opportunity to be involved in your care.  Below is a brief recap of your visit and next steps.  We will plan to see you again in 6 weeks.  Summary I am please with your recent blood sugar readings Increase Mounjaro to 10 mg weekly Start decreasing Humalog (meal time insulin) by 4 units if your blood sugar before meals is less than 180. We will follow up in 6 weeks. Repeat A1c ordered today

## 2022-11-19 NOTE — Progress Notes (Signed)
Established Patient Office Visit  Subjective   Patient ID: Sue Wood, female    DOB: 23-Mar-1946  Age: 77 y.o. MRN: [id number]  Chief Complaint  Patient presents with   Diabetes    Follow up   Hypertension    Follow up   Sue Wood returns to care today for DM follow-up.  She was last seen by me on 1/31 at which time Sue Wood was increased to 7.5 mg weekly.  Her insulin regimen was continued and 4-week follow-up was arranged.  There have been no acute interval events. Sue Wood reports feeling well today. She is asymptomatic and has no additional concerns to discuss.  She has not experienced any adverse side effects with increasing Mounjaro and has been checking her blood sugar 3 times daily.  She brings a log today for review.  Past Medical History:  Diagnosis Date   Arthritis    Diabetes mellitus    Hyperlipidemia    Hypertension    Peripheral neuropathy    Past Surgical History:  Procedure Laterality Date   ABDOMINAL HYSTERECTOMY     CESAREAN SECTION     X 2   COLONOSCOPY N/A 12/08/2013   Procedure: COLONOSCOPY;  Surgeon: Rogene Houston, MD;  Location: [facility];  Service: Endoscopy;  Laterality: N/A;  930   KNEE ARTHROSCOPY  1998   left   Social History   Tobacco Use   Smoking status: Former    Packs/day: 0.25    Years: 18.00    Total pack years: 4.50    Types: Cigarettes   Smokeless tobacco: Never   Tobacco comments:    quit 1980's  Substance Use Topics   Alcohol use: No   Drug use: No   Family History  Problem Relation Age of Onset   Hypertension Mother    Hyperlipidemia Mother    Hypertension Father    Hyperlipidemia Father    Diabetes Father    Hypertension Sister    Lung disease Sister    Heart disease Sister    Stroke Sister    Emphysema Brother    Diabetes Brother    Heart disease Brother    Colon cancer Neg Hx    Breast cancer Neg Hx    Allergies  Allergen Reactions   Lisinopril Swelling    Lip swelling    Review of  Systems  Constitutional:  Negative for chills and fever.  HENT:  Negative for sore throat.   Respiratory:  Negative for cough and shortness of breath.   Cardiovascular:  Negative for chest pain, palpitations and leg swelling.  Gastrointestinal:  Negative for abdominal pain, blood in stool, constipation, diarrhea, nausea and vomiting.  Genitourinary:  Negative for dysuria and hematuria.  Musculoskeletal:  Negative for myalgias.  Skin:  Negative for itching and rash.  Neurological:  Negative for dizziness and headaches.  Psychiatric/Behavioral:  Negative for depression and suicidal ideas.      Objective:     BP 123/75   Pulse 80   Ht '5\' 7"'$  (1.702 m)   Wt 220 lb 3.2 oz (99.9 kg)   SpO2 96%   BMI 34.49 kg/m  BP Readings from Last 3 Encounters:  11/19/22 123/75  10/08/22 138/79  09/10/22 128/74   Physical Exam Vitals reviewed.  Constitutional:      General: She is not in acute distress.    Appearance: Normal appearance. She is obese. She is not toxic-appearing.  HENT:     Head: Normocephalic and atraumatic.  Right Ear: External ear normal.     Left Ear: External ear normal.     Nose: Nose normal. No congestion or rhinorrhea.     Mouth/Throat:     Mouth: Mucous membranes are moist.     Pharynx: Oropharynx is clear. No oropharyngeal exudate or posterior oropharyngeal erythema.  Eyes:     General: No scleral icterus.    Extraocular Movements: Extraocular movements intact.     Conjunctiva/sclera: Conjunctivae normal.     Pupils: Pupils are equal, round, and reactive to light.  Cardiovascular:     Rate and Rhythm: Normal rate and regular rhythm.     Pulses: Normal pulses.     Heart sounds: Normal heart sounds. No murmur heard.    No friction rub. No gallop.  Pulmonary:     Effort: Pulmonary effort is normal.     Breath sounds: Normal breath sounds. No wheezing, rhonchi or rales.  Abdominal:     General: Abdomen is flat. Bowel sounds are normal. There is no distension.      Palpations: Abdomen is soft.     Tenderness: There is no abdominal tenderness.  Musculoskeletal:        General: No swelling. Normal range of motion.     Cervical back: Normal range of motion.     Right lower leg: No edema.     Left lower leg: No edema.  Lymphadenopathy:     Cervical: No cervical adenopathy.  Skin:    General: Skin is warm and dry.     Capillary Refill: Capillary refill takes less than 2 seconds.     Coloration: Skin is not jaundiced.  Neurological:     General: No focal deficit present.     Mental Status: She is alert and oriented to person, place, and time.  Psychiatric:        Mood and Affect: Mood normal.        Behavior: Behavior normal.   Last CBC Lab Results  Component Value Date   WBC 6.9 03/28/2022   HGB 12.1 03/28/2022   HCT 37.8 03/28/2022   MCV 87 03/28/2022   MCH 27.9 03/28/2022   RDW 13.7 03/28/2022   PLT 224 [id number]   Last metabolic panel Lab Results  Component Value Date   GLUCOSE 274 (H) 08/08/2022   NA 138 08/08/2022   K 3.9 08/08/2022   CL 100 08/08/2022   CO2 22 08/08/2022   BUN 17 08/08/2022   CREATININE 0.99 08/08/2022   EGFR 60 08/08/2022   CALCIUM 9.7 08/08/2022   PROT 7.8 04/04/2022   ALBUMIN 4.3 04/04/2022   LABGLOB 3.5 04/04/2022   AGRATIO 1.2 04/04/2022   BILITOT <0.2 04/04/2022   ALKPHOS 85 04/04/2022   AST 23 04/04/2022   ALT 25 04/04/2022   Last lipids Lab Results  Component Value Date   CHOL 134 03/28/2022   HDL 51 03/28/2022   LDLCALC 67 03/28/2022   TRIG 82 03/28/2022   CHOLHDL 2.6 03/28/2022   Last hemoglobin A1c Lab Results  Component Value Date   HGBA1C 6.7 11/19/2022   Last thyroid functions Lab Results  Component Value Date   TSH 2.360 04/04/2022   Last vitamin D Lab Results  Component Value Date   VD25OH 41.6 04/04/2022   The 10-year ASCVD risk score (Arnett DK, et al., 2019) is: 21.1%    Assessment & Plan:   Problem List Items Addressed This Visit       Diabetes  mellitus with neurological manifestation ([place]) -  Primary    Presenting today for DM follow-up.  Mounjaro was increased to 7.5 mg weekly on 1/31. She has been checking her blood sugar 3 times daily.  There are no readings > 200.  She has a low reading of 79.  She remains on Jardiance 25 mg daily, Levemir 36 units twice daily, and Humalog 12 units 3 times daily with meals.  POC A1c today has improved to 6.7 from 8.4 previously. -She was congratulated on her progress in improving glycemic control. -Increase Mounjaro to 10 mg weekly -We discussed gradually reducing Humalog.  I recommended decreasing Humalog by 4 units if her pre-meal blood sugar reading is < 180 -We will tentatively plan for follow-up in 6 weeks      Return in about 6 weeks (around 12/31/2022) for DM.   Johnette Abraham, MD

## 2022-12-23 ENCOUNTER — Other Ambulatory Visit: Payer: Self-pay | Admitting: Nurse Practitioner

## 2022-12-31 ENCOUNTER — Ambulatory Visit (INDEPENDENT_AMBULATORY_CARE_PROVIDER_SITE_OTHER): Payer: Medicare Other | Admitting: Internal Medicine

## 2022-12-31 ENCOUNTER — Encounter: Payer: Self-pay | Admitting: Internal Medicine

## 2022-12-31 VITALS — BP 139/73 | HR 80 | Ht 67.0 in | Wt 213.2 lb

## 2022-12-31 DIAGNOSIS — E0840 Diabetes mellitus due to underlying condition with diabetic neuropathy, unspecified: Secondary | ICD-10-CM

## 2022-12-31 DIAGNOSIS — Z794 Long term (current) use of insulin: Secondary | ICD-10-CM | POA: Diagnosis not present

## 2022-12-31 MED ORDER — TIRZEPATIDE 12.5 MG/0.5ML ~~LOC~~ SOAJ: 12.5000 mg | SUBCUTANEOUS | 0 refills | Status: DC

## 2022-12-31 NOTE — Assessment & Plan Note (Signed)
Presenting today for DM follow-up.  A1c 6.2-month ago.  She is currently prescribed Levemir 36 units twice daily with meals, Humalog 4 units 3 times daily with meals, Jardiance 25 mg daily, and Mounjaro 10 mg weekly.  She reports pre-meal blood sugar readings ranging 90-110. -Discontinue Humalog -Increase Mounjaro to 12.5 mg weekly -Continue Jardiance 25 mg daily -Continue Levemir 36 units twice daily.  We discussed that she should reduce Levemir to 30 units if pre-meal blood sugar readings are < 90. -Follow-up in 3 months for repeat labs

## 2022-12-31 NOTE — Patient Instructions (Addendum)
It was a pleasure to see you today.  Thank you for giving [place] the opportunity to be involved in your care.  Below is a brief recap of your visit and next steps.  We will plan to see you again in 3 months.  Summary STOP humalog Increase Mounjaro to 12.5 mg weekly Continue Levemir 36 units twice daily - decrease to 30 units if pre-meal blood sugar readings are less than 90 Continue Jardiance Follow up in 2 months for repeat A1c

## 2022-12-31 NOTE — Progress Notes (Signed)
Established Patient Office Visit  Subjective   Patient ID: Sue Wood, female    DOB: 13-Aug-1946  Age: 77 y.o. MRN: [id number]  Chief Complaint  Patient presents with   Diabetes    Follow up   Sue Wood returns to care today for DM follow-up.  She was last evaluated by me on 3/13 which time Mounjaro was increased to 10 mg weekly.  We also discussed gradually reducing Humalog by 4 units if her [place] blood sugar reading was within goal.  4-week follow-up was arranged.  There have been no acute interval events.  Sue Wood reports feeling well today.  She is asymptomatic and has no acute concerns to discuss.  She reports that her pre-meal blood sugars have ranged 90-110.  Past Medical History:  Diagnosis Date   Arthritis    Diabetes mellitus    Hyperlipidemia    Hypertension    Peripheral neuropathy    Past Surgical History:  Procedure Laterality Date   ABDOMINAL HYSTERECTOMY     CESAREAN SECTION     X 2   COLONOSCOPY N/A 12/08/2013   Procedure: COLONOSCOPY;  Surgeon: Malissa Hippo, MD;  Location: [facility];  Service: Endoscopy;  Laterality: N/A;  930   KNEE ARTHROSCOPY  1998   left   Social History   Tobacco Use   Smoking status: Former    Packs/day: 0.25    Years: 18.00    Additional pack years: 0.00    Total pack years: 4.50    Types: Cigarettes   Smokeless tobacco: Never   Tobacco comments:    quit 1980's  Substance Use Topics   Alcohol use: No   Drug use: No   Family History  Problem Relation Age of Onset   Hypertension Mother    Hyperlipidemia Mother    Hypertension Father    Hyperlipidemia Father    Diabetes Father    Hypertension Sister    Lung disease Sister    Heart disease Sister    Stroke Sister    Emphysema Brother    Diabetes Brother    Heart disease Brother    Colon cancer Neg Hx    Breast cancer Neg Hx    Allergies  Allergen Reactions   Lisinopril Swelling    Lip swelling    Review of Systems  Constitutional:   Negative for chills and fever.  HENT:  Negative for sore throat.   Respiratory:  Negative for cough and shortness of breath.   Cardiovascular:  Negative for chest pain, palpitations and leg swelling.  Gastrointestinal:  Negative for abdominal pain, blood in stool, constipation, diarrhea, nausea and vomiting.  Genitourinary:  Negative for dysuria and hematuria.  Musculoskeletal:  Negative for myalgias.  Skin:  Negative for itching and rash.  Neurological:  Negative for dizziness and headaches.  Psychiatric/Behavioral:  Negative for depression and suicidal ideas.      Objective:     BP 139/73   Pulse 80   Ht 5\' 7"  (1.702 m)   Wt 213 lb 3.2 oz (96.7 kg)   SpO2 97%   BMI 33.39 kg/m  BP Readings from Last 3 Encounters:  12/31/22 139/73  11/19/22 123/75  10/08/22 138/79   Physical Exam Vitals reviewed.  Constitutional:      General: She is not in acute distress.    Appearance: Normal appearance. She is obese. She is not toxic-appearing.  HENT:     Head: Normocephalic and atraumatic.     Right Ear: External  ear normal.     Left Ear: External ear normal.     Nose: Nose normal. No congestion or rhinorrhea.     Mouth/Throat:     Mouth: Mucous membranes are moist.     Pharynx: Oropharynx is clear. No oropharyngeal exudate or posterior oropharyngeal erythema.  Eyes:     General: No scleral icterus.    Extraocular Movements: Extraocular movements intact.     Conjunctiva/sclera: Conjunctivae normal.     Pupils: Pupils are equal, round, and reactive to light.  Cardiovascular:     Rate and Rhythm: Normal rate and regular rhythm.     Pulses: Normal pulses.     Heart sounds: Normal heart sounds. No murmur heard.    No friction rub. No gallop.  Pulmonary:     Effort: Pulmonary effort is normal.     Breath sounds: Normal breath sounds. No wheezing, rhonchi or rales.  Abdominal:     General: Abdomen is flat. Bowel sounds are normal. There is no distension.     Palpations: Abdomen is  soft.     Tenderness: There is no abdominal tenderness.  Musculoskeletal:        General: No swelling. Normal range of motion.     Cervical back: Normal range of motion.     Right lower leg: No edema.     Left lower leg: No edema.  Lymphadenopathy:     Cervical: No cervical adenopathy.  Skin:    General: Skin is warm and dry.     Capillary Refill: Capillary refill takes less than 2 seconds.     Coloration: Skin is not jaundiced.  Neurological:     General: No focal deficit present.     Mental Status: She is alert and oriented to person, place, and time.  Psychiatric:        Mood and Affect: Mood normal.        Behavior: Behavior normal.   Last CBC Lab Results  Component Value Date   WBC 6.9 03/28/2022   HGB 12.1 03/28/2022   HCT 37.8 03/28/2022   MCV 87 03/28/2022   MCH 27.9 03/28/2022   RDW 13.7 03/28/2022   PLT 224 03/28/2022   Last metabolic panel Lab Results  Component Value Date   GLUCOSE 274 (H) 08/08/2022   NA 138 08/08/2022   K 3.9 08/08/2022   CL 100 08/08/2022   CO2 22 08/08/2022   BUN 17 08/08/2022   CREATININE 0.99 08/08/2022   EGFR 60 08/08/2022   CALCIUM 9.7 08/08/2022   PROT 7.8 04/04/2022   ALBUMIN 4.3 04/04/2022   LABGLOB 3.5 04/04/2022   AGRATIO 1.2 04/04/2022   BILITOT <0.2 04/04/2022   ALKPHOS 85 04/04/2022   AST 23 04/04/2022   ALT 25 04/04/2022   Last lipids Lab Results  Component Value Date   CHOL 134 03/28/2022   HDL 51 03/28/2022   LDLCALC 67 03/28/2022   TRIG 82 03/28/2022   CHOLHDL 2.6 03/28/2022   Last hemoglobin A1c Lab Results  Component Value Date   HGBA1C 6.7 11/19/2022   Last thyroid functions Lab Results  Component Value Date   TSH 2.360 04/04/2022   Last vitamin D Lab Results  Component Value Date   VD25OH 41.6 04/04/2022   The 10-year ASCVD risk score (Arnett DK, et al., 2019) is: 26%    Assessment & Plan:   Problem List Items Addressed This Visit       Diabetes mellitus with neurological  manifestation - Primary  Presenting today for DM follow-up.  A1c 6.8-month ago.  She is currently prescribed Levemir 36 units twice daily with meals, Humalog 4 units 3 times daily with meals, Jardiance 25 mg daily, and Mounjaro 10 mg weekly.  She reports pre-meal blood sugar readings ranging 90-110. -Discontinue Humalog -Increase Mounjaro to 12.5 mg weekly -Continue Jardiance 25 mg daily -Continue Levemir 36 units twice daily.  We discussed that she should reduce Levemir to 30 units if pre-meal blood sugar readings are < 90. -Follow-up in 3 months for repeat labs       Return in about 3 months (around 04/01/2023).    Billie Lade, MD

## 2023-01-02 ENCOUNTER — Other Ambulatory Visit: Payer: Self-pay | Admitting: Nurse Practitioner

## 2023-01-02 DIAGNOSIS — E782 Mixed hyperlipidemia: Secondary | ICD-10-CM

## 2023-02-27 ENCOUNTER — Other Ambulatory Visit: Payer: Self-pay | Admitting: Internal Medicine

## 2023-02-27 DIAGNOSIS — Z794 Long term (current) use of insulin: Secondary | ICD-10-CM

## 2023-03-26 ENCOUNTER — Other Ambulatory Visit: Payer: Self-pay | Admitting: Nurse Practitioner

## 2023-04-01 ENCOUNTER — Encounter: Payer: Self-pay | Admitting: Internal Medicine

## 2023-04-01 ENCOUNTER — Ambulatory Visit (INDEPENDENT_AMBULATORY_CARE_PROVIDER_SITE_OTHER): Payer: Medicare Other | Admitting: Internal Medicine

## 2023-04-01 ENCOUNTER — Other Ambulatory Visit: Payer: Self-pay | Admitting: Internal Medicine

## 2023-04-01 VITALS — BP 116/71 | HR 92 | Ht 67.0 in | Wt 209.0 lb

## 2023-04-01 DIAGNOSIS — Z794 Long term (current) use of insulin: Secondary | ICD-10-CM | POA: Diagnosis not present

## 2023-04-01 DIAGNOSIS — E0840 Diabetes mellitus due to underlying condition with diabetic neuropathy, unspecified: Secondary | ICD-10-CM | POA: Diagnosis not present

## 2023-04-01 DIAGNOSIS — E782 Mixed hyperlipidemia: Secondary | ICD-10-CM

## 2023-04-01 DIAGNOSIS — I10 Essential (primary) hypertension: Secondary | ICD-10-CM | POA: Diagnosis not present

## 2023-04-01 DIAGNOSIS — E1141 Type 2 diabetes mellitus with diabetic mononeuropathy: Secondary | ICD-10-CM

## 2023-04-01 DIAGNOSIS — E559 Vitamin D deficiency, unspecified: Secondary | ICD-10-CM | POA: Diagnosis not present

## 2023-04-01 NOTE — Progress Notes (Signed)
Established Patient Office Visit  Subjective   Patient ID: Sue Wood, female    DOB: [birth year]  Age: 77 y.o. MRN: [id number]  Chief Complaint  Patient presents with   Diabetes    Follow up   Sue Wood returns to care today for routine follow-up.  She was last evaluated by me on 4/24 for diabetes follow-up.  Humalog was discontinued at that time and Sue Wood was increased to 12.5 mg weekly.  There have been no acute interval events.  Sue Wood reports feeling well today.  She is asymptomatic and has no additional concerns to discuss.  Past Medical History:  Diagnosis Date   Arthritis    Diabetes mellitus    Hyperlipidemia    Hypertension    Peripheral neuropathy    Past Surgical History:  Procedure Laterality Date   ABDOMINAL HYSTERECTOMY     CESAREAN SECTION     X 2   COLONOSCOPY N/A 12/08/2013   Procedure: COLONOSCOPY;  Surgeon: Malissa Hippo, MD;  Location: [facility];  Service: Endoscopy;  Laterality: N/A;  930   KNEE ARTHROSCOPY  1998   left   Social History   Tobacco Use   Smoking status: Former    Current packs/Wood: 0.25    Average packs/Wood: 0.3 packs/Wood for 18.0 years (4.5 ttl pk-yrs)    Types: Cigarettes   Smokeless tobacco: Never   Tobacco comments:    quit 1980's  Substance Use Topics   Alcohol use: No   Drug use: No   Family History  Problem Relation Age of Onset   Hypertension Mother    Hyperlipidemia Mother    Hypertension Father    Hyperlipidemia Father    Diabetes Father    Hypertension Sister    Lung disease Sister    Heart disease Sister    Stroke Sister    Emphysema Brother    Diabetes Brother    Heart disease Brother    Colon cancer Neg Hx    Breast cancer Neg Hx    Allergies  Allergen Reactions   Lisinopril Swelling    Lip swelling    Review of Systems  Constitutional:  Negative for chills and fever.  HENT:  Negative for sore throat.   Respiratory:  Negative for cough and shortness of breath.    Cardiovascular:  Negative for chest pain, palpitations and leg swelling.  Gastrointestinal:  Negative for abdominal pain, blood in stool, constipation, diarrhea, nausea and vomiting.  Genitourinary:  Negative for dysuria and hematuria.  Musculoskeletal:  Negative for myalgias.  Skin:  Negative for itching and rash.  Neurological:  Negative for dizziness and headaches.  Psychiatric/Behavioral:  Negative for depression and suicidal ideas.      Objective:     BP 116/71   Pulse 92   Ht 5\' 7"  (1.702 m)   Wt 209 lb (94.8 kg)   SpO2 97%   BMI 32.73 kg/m  BP Readings from Last 3 Encounters:  04/01/23 116/71  12/31/22 139/73  11/19/22 123/75   Physical Exam Vitals reviewed.  Constitutional:      General: She is not in acute distress.    Appearance: Normal appearance. She is obese. She is not toxic-appearing.  HENT:     Head: Normocephalic and atraumatic.     Right Ear: External ear normal.     Left Ear: External ear normal.     Nose: Nose normal. No congestion or rhinorrhea.     Mouth/Throat:     Mouth: Mucous  membranes are moist.     Pharynx: Oropharynx is clear. No oropharyngeal exudate or posterior oropharyngeal erythema.  Eyes:     General: No scleral icterus.    Extraocular Movements: Extraocular movements intact.     Conjunctiva/sclera: Conjunctivae normal.     Pupils: Pupils are equal, round, and reactive to light.  Cardiovascular:     Rate and Rhythm: Normal rate and regular rhythm.     Pulses: Normal pulses.     Heart sounds: Normal heart sounds. No murmur heard.    No friction rub. No gallop.  Pulmonary:     Effort: Pulmonary effort is normal.     Breath sounds: Normal breath sounds. No wheezing, rhonchi or rales.  Abdominal:     General: Abdomen is flat. Bowel sounds are normal. There is no distension.     Palpations: Abdomen is soft.     Tenderness: There is no abdominal tenderness.  Musculoskeletal:        General: No swelling. Normal range of motion.      Cervical back: Normal range of motion.     Right lower leg: No edema.     Left lower leg: No edema.  Lymphadenopathy:     Cervical: No cervical adenopathy.  Skin:    General: Skin is warm and dry.     Capillary Refill: Capillary refill takes less than 2 seconds.     Coloration: Skin is not jaundiced.  Neurological:     General: No focal deficit present.     Mental Status: She is alert and oriented to person, place, and time.  Psychiatric:        Mood and Affect: Mood normal.        Behavior: Behavior normal.   Last CBC Lab Results  Component Value Date   WBC 6.9 03/28/2022   HGB 12.1 03/28/2022   HCT 37.8 03/28/2022   MCV 87 03/28/2022   MCH 27.9 03/28/2022   RDW 13.7 03/28/2022   PLT 224 03/28/2022   Last metabolic panel Lab Results  Component Value Date   GLUCOSE 274 (H) 08/08/2022   NA 138 08/08/2022   K 3.9 08/08/2022   CL 100 08/08/2022   CO2 22 08/08/2022   BUN 17 08/08/2022   CREATININE 0.99 08/08/2022   EGFR 60 08/08/2022   CALCIUM 9.7 08/08/2022   PROT 7.8 04/04/2022   ALBUMIN 4.3 04/04/2022   LABGLOB 3.5 04/04/2022   AGRATIO 1.2 04/04/2022   BILITOT <0.2 04/04/2022   ALKPHOS 85 04/04/2022   AST 23 04/04/2022   ALT 25 04/04/2022   Last lipids Lab Results  Component Value Date   CHOL 134 03/28/2022   HDL 51 03/28/2022   LDLCALC 67 03/28/2022   TRIG 82 03/28/2022   CHOLHDL 2.6 03/28/2022   Last hemoglobin A1c Lab Results  Component Value Date   HGBA1C 6.7 11/19/2022   Last thyroid functions Lab Results  Component Value Date   TSH 2.360 04/04/2022   Last vitamin D Lab Results  Component Value Date   VD25OH 41.6 04/04/2022   The 10-year ASCVD risk score (Arnett DK, et al., 2019) is: 20.4%    Assessment & Plan:   Problem List Items Addressed This Visit       Essential hypertension, benign - Primary    HTN remains adequately controlled on current antihypertensive regimen of amlodipine-olmesartan 10-20 mg daily as well as HCTZ 25 mg  daily.  BP today is 116/71.  No medication changes are indicated.  Diabetes mellitus with neurological manifestation ([facility])    A1c 6.7 on labs from March.  She is currently prescribed Mounjaro 12.5 mg weekly, Jardiance 25 mg daily, and Levemir.  She reports that she has reduced Levemir to 26 units twice daily, which is down from 36 units previously.  She regularly checks her blood sugar and reports readings that are consistently within goal. -Repeat A1c and urine microalbumin/creatinine ratio have been ordered today -Additional diabetes related preventative care items are up-to-date      Diabetic neuropathy ([facility])    Symptoms remain adequately controlled with nortriptyline 25 mg nightly.      Hyperlipidemia    Lipid panel updated in July 2023.  Total cholesterol 134 and LDL 67.  She is currently prescribed rosuvastatin 40 mg daily as well as ezetimibe 10 mg daily.  -Repeat lipid panel ordered today       Return in about 3 months (around 07/02/2023).    Billie Lade, MD

## 2023-04-01 NOTE — Assessment & Plan Note (Signed)
HTN remains adequately controlled on current antihypertensive regimen of amlodipine-olmesartan 10-20 mg daily as well as HCTZ 25 mg daily.  BP today is 116/71.  No medication changes are indicated.

## 2023-04-01 NOTE — Patient Instructions (Signed)
It was a pleasure to see you today.  Thank you for giving us the opportunity to be involved in your care.  Below is a brief recap of your visit and next steps.  We will plan to see you again in 3 months.  Summary No medication changes today Repeat labs ordered Follow up in 3 months  

## 2023-04-01 NOTE — Assessment & Plan Note (Signed)
Symptoms remain adequately controlled with nortriptyline 25 mg nightly.

## 2023-04-01 NOTE — Assessment & Plan Note (Addendum)
Lipid panel updated in July 2023.  Total cholesterol 134 and LDL 67.  She is currently prescribed rosuvastatin 40 mg daily as well as ezetimibe 10 mg daily.  -Repeat lipid panel ordered today

## 2023-04-01 NOTE — Assessment & Plan Note (Signed)
A1c 6.7 on labs from March.  She is currently prescribed Mounjaro 12.5 mg weekly, Jardiance 25 mg daily, and Levemir.  She reports that she has reduced Levemir to 26 units twice daily, which is down from 36 units previously.  She regularly checks her blood sugar and reports readings that are consistently within goal. -Repeat A1c and urine microalbumin/creatinine ratio have been ordered today -Additional diabetes related preventative care items are up-to-date

## 2023-04-02 ENCOUNTER — Other Ambulatory Visit: Payer: Self-pay | Admitting: Internal Medicine

## 2023-04-02 DIAGNOSIS — Z794 Long term (current) use of insulin: Secondary | ICD-10-CM

## 2023-04-02 LAB — LIPID PANEL
Chol/HDL Ratio: 2.8 ratio (ref 0.0–4.4)
Cholesterol, Total: 137 mg/dL (ref 100–199)
HDL: 49 mg/dL (ref >39)
LDL Chol Calc (NIH): 73 mg/dL (ref 0–99)
Triglycerides: 78 mg/dL (ref 0–149)

## 2023-04-02 LAB — CBC WITH DIFFERENTIAL/PLATELET
Basophils Absolute: 0 10*3/uL (ref 0.0–0.2)
Basos: 1 %
Eos: 1 %
Hemoglobin: 11.7 g/dL (ref 11.1–15.9)
Immature Grans (Abs): 0 10*3/uL (ref 0.0–0.1)
Lymphs: 35 %
MCH: 27.8 pg (ref 26.6–33.0)
MCHC: 32.8 g/dL (ref 31.5–35.7)
MCV: 85 fL (ref 79–97)
Monocytes Absolute: 0.3 10*3/uL (ref 0.1–0.9)
Neutrophils Absolute: 2.8 10*3/uL (ref 1.4–7.0)
Platelets: 232 10*3/uL (ref 150–450)
RBC: 4.21 x10E6/uL (ref 3.77–5.28)
RDW: 14.5 % (ref 11.7–15.4)

## 2023-04-02 LAB — CMP14+EGFR
ALT: 19 IU/L (ref 0–32)
Albumin: 4.3 g/dL (ref 3.8–4.8)
Alkaline Phosphatase: 75 IU/L (ref 44–121)
BUN: 14 mg/dL (ref 8–27)
Bilirubin Total: <0.2 mg/dL (ref 0.0–1.2)
CO2: 18 mmol/L — ABNORMAL LOW (ref 20–29)
Calcium: 9.4 mg/dL (ref 8.7–10.3)
Globulin, Total: 3.3 g/dL (ref 1.5–4.5)
Glucose: 134 mg/dL — ABNORMAL HIGH (ref 70–99)
Potassium: 4.1 mmol/L (ref 3.5–5.2)
Sodium: 143 mmol/L (ref 134–144)
Total Protein: 7.6 g/dL (ref 6.0–8.5)

## 2023-04-02 LAB — VITAMIN D 25 HYDROXY (VIT D DEFICIENCY, FRACTURES)

## 2023-04-02 LAB — B12 AND FOLATE PANEL

## 2023-04-02 LAB — TSH+FREE T4: TSH: 2.32 u[IU]/mL (ref 0.450–4.500)

## 2023-04-02 MED ORDER — TIRZEPATIDE 15 MG/0.5ML ~~LOC~~ SOAJ: 15.0000 mg | SUBCUTANEOUS | 1 refills | Status: DC

## 2023-04-03 LAB — CBC WITH DIFFERENTIAL/PLATELET: EOS (ABSOLUTE): 0.1 10*3/uL (ref 0.0–0.4)

## 2023-06-10 ENCOUNTER — Ambulatory Visit (INDEPENDENT_AMBULATORY_CARE_PROVIDER_SITE_OTHER): Payer: Medicare Other

## 2023-06-10 VITALS — Ht 67.0 in | Wt 209.0 lb

## 2023-06-10 DIAGNOSIS — Z1231 Encounter for screening mammogram for malignant neoplasm of breast: Secondary | ICD-10-CM

## 2023-06-10 DIAGNOSIS — Z0100 Encounter for examination of eyes and vision without abnormal findings: Secondary | ICD-10-CM

## 2023-06-10 DIAGNOSIS — Z0000 Encounter for general adult medical examination without abnormal findings: Secondary | ICD-10-CM

## 2023-06-10 NOTE — Patient Instructions (Signed)
Ms. Pointer , Thank you for taking time to come for your Medicare Wellness Visit. I appreciate your ongoing commitment to your health goals. Please review the following plan we discussed and let me know if I can assist you in the future.   Referrals/Orders/Follow-Ups/Clinician Recommendations:  Next Medicare Annual Wellness Visit: September 14, 2024 at 2:30pm virtual visit  You've been referred to see [facility] for a diabetic eye exam.If you haven't heard from them in 7 business days, please call the number below to schedule your appointment.  [facility] [address] [place] [address] PHONE: [phone]   This is a list of the screening recommended for you and due dates:  Health Maintenance  Topic Date Due   Zoster (Shingles) Vaccine (1 of 2) Never done   Eye exam for diabetics  06/25/2019   Complete foot exam   04/05/2023   Flu Shot  04/09/2023   Mammogram  04/22/2023   DTaP/Tdap/Td vaccine (2 - Td or Tdap) 09/01/2023   Hemoglobin A1C  10/02/2023   Colon Cancer Screening  12/09/2023   Yearly kidney function blood test for diabetes  03/31/2024   Yearly kidney health urinalysis for diabetes  03/31/2024   Medicare Annual Wellness Visit  06/09/2024   DEXA scan (bone density measurement)  11/21/2024   Pneumonia Vaccine  Completed   Hepatitis C Screening  Completed   HPV Vaccine  Aged Out   COVID-19 Vaccine  Discontinued    Advanced directives: (Declined) Advance directive discussed with you today. Even though you declined this today, please call our office should you change your mind, and we can give you the proper paperwork for you to fill out.  Next Medicare Annual Wellness Visit scheduled for next year: Yes  Preventive Care 58 Years and Older, Female Preventive care refers to lifestyle choices and visits with your health care provider that can promote health and wellness. Preventive care visits are also called wellness exams. What can I expect for my  preventive care visit? Counseling Your health care provider may ask you questions about your: Medical history, including: Past medical problems. Family medical history. Pregnancy and menstrual history. History of falls. Current health, including: Memory and ability to understand (cognition). Emotional well-being. Home life and relationship well-being. Sexual activity and sexual health. Lifestyle, including: Alcohol, nicotine or tobacco, and drug use. Access to firearms. Diet, exercise, and sleep habits. Work and work Astronomer. Sunscreen use. Safety issues such as seatbelt and bike helmet use. Physical exam Your health care provider will check your: Height and weight. These may be used to calculate your BMI (body mass index). BMI is a measurement that tells if you are at a healthy weight. Waist circumference. This measures the distance around your waistline. This measurement also tells if you are at a healthy weight and may help predict your risk of certain diseases, such as type 2 diabetes and high blood pressure. Heart rate and blood pressure. Body temperature. Skin for abnormal spots. What immunizations do I need?  Vaccines are usually given at various ages, according to a schedule. Your health care provider will recommend vaccines for you based on your age, medical history, and lifestyle or other factors, such as travel or where you work. What tests do I need? Screening Your health care provider may recommend screening tests for certain conditions. This may include: Lipid and cholesterol levels. Hepatitis C test. Hepatitis B test. HIV (human immunodeficiency virus) test. STI (sexually transmitted infection) testing, if you are  at risk. Lung cancer screening. Colorectal cancer screening. Diabetes screening. This is done by checking your blood sugar (glucose) after you have not eaten for a while (fasting). Mammogram. Talk with your health care provider about how often you  should have regular mammograms. BRCA-related cancer screening. This may be done if you have a family history of breast, ovarian, tubal, or peritoneal cancers. Bone density scan. This is done to screen for osteoporosis. Talk with your health care provider about your test results, treatment options, and if necessary, the need for more tests. Follow these instructions at home: Eating and drinking  Eat a diet that includes fresh fruits and vegetables, whole grains, lean protein, and low-fat dairy products. Limit your intake of foods with high amounts of sugar, saturated fats, and salt. Take vitamin and mineral supplements as recommended by your health care provider. Do not drink alcohol if your health care provider tells you not to drink. If you drink alcohol: Limit how much you have to 0-1 drink a day. Know how much alcohol is in your drink. In the [place] one drink equals one 12 oz bottle of beer (355 mL), one 5 oz glass of wine (148 mL), or one 1 oz glass of hard liquor (44 mL). Lifestyle Brush your teeth every morning and night with fluoride toothpaste. Floss one time each day. Exercise for at least 30 minutes 5 or more days each week. Do not use any products that contain nicotine or tobacco. These products include cigarettes, chewing tobacco, and vaping devices, such as e-cigarettes. If you need help quitting, ask your health care provider. Do not use drugs. If you are sexually active, practice safe sex. Use a condom or other form of protection in order to prevent STIs. Take aspirin only as told by your health care provider. Make sure that you understand how much to take and what form to take. Work with your health care provider to find out whether it is safe and beneficial for you to take aspirin daily. Ask your health care provider if you need to take a cholesterol-lowering medicine (statin). Find healthy ways to manage stress, such as: Meditation, yoga, or listening to  music. Journaling. Talking to a trusted person. Spending time with friends and family. Minimize exposure to UV radiation to reduce your risk of skin cancer. Safety Always wear your seat belt while driving or riding in a vehicle. Do not drive: If you have been drinking alcohol. Do not ride with someone who has been drinking. When you are tired or distracted. While texting. If you have been using any mind-altering substances or drugs. Wear a helmet and other protective equipment during sports activities. If you have firearms in your house, make sure you follow all gun safety procedures. What's next? Visit your health care provider once a year for an annual wellness visit. Ask your health care provider how often you should have your eyes and teeth checked. Stay up to date on all vaccines. This information is not intended to replace advice given to you by your health care provider. Make sure you discuss any questions you have with your health care provider. Document Revised: 02/20/2021 Document Reviewed: 02/20/2021 Elsevier Patient Education  2024 [employer]. Understanding Your Risk for Falls Millions of people have serious injuries from falls each year. It is important to understand your risk of falling. Talk with your health care provider about your risk and what you can do to lower it. If you do have a serious fall, make sure  to tell your provider. Falling once raises your risk of falling again. How can falls affect me? Serious injuries from falls are common. These include: Broken bones, such as hip fractures. Head injuries, such as traumatic brain injuries (TBI) or concussions. A fear of falling can cause you to avoid activities and stay at home. This can make your muscles weaker and raise your risk for a fall. What can increase my risk? There are a number of risk factors that increase your risk for falling. The more risk factors you have, the higher your risk of falling. Serious  injuries from a fall happen most often to people who are older than 75 years old. Teenagers and young adults ages 94-29 are also at higher risk. Common risk factors include: Weakness in the lower body. Being generally weak or confused due to long-term (chronic) illness. Dizziness or balance problems. Poor vision. Medicines that cause dizziness or drowsiness. These may include: Medicines for your blood pressure, heart, anxiety, insomnia, or swelling (edema). Pain medicines. Muscle relaxants. Other risk factors include: Drinking alcohol. Having had a fall in the past. Having foot pain or wearing improper footwear. Working at a dangerous job. Having any of the following in your home: Tripping hazards, such as floor clutter or loose rugs. Poor lighting. Pets. Having dementia or memory loss. What actions can I take to lower my risk of falling?     Physical activity Stay physically fit. Do strength and balance exercises. Consider taking a regular class to build strength and balance. Yoga and tai chi are good options. Vision Have your eyes checked every year and your prescription for glasses or contacts updated as needed. Shoes and walking aids Wear non-skid shoes. Wear shoes that have rubber soles and low heels. Do not wear high heels. Do not walk around the house in socks or slippers. Use a cane or walker as told by your provider. Home safety Attach secure railings on both sides of your stairs. Install grab bars for your bathtub, shower, and toilet. Use a non-skid mat in your bathtub or shower. Attach bath mats securely with double-sided, non-slip rug tape. Use good lighting in all rooms. Keep a flashlight near your bed. Make sure there is a clear path from your bed to the bathroom. Use night-lights. Do not use throw rugs. Make sure all carpeting is taped or tacked down securely. Remove all clutter from walkways and stairways, including extension cords. Repair uneven or broken  steps and floors. Avoid walking on icy or slippery surfaces. Walk on the grass instead of on icy or slick sidewalks. Use ice melter to get rid of ice on walkways in the winter. Use a cordless phone. Questions to ask your health care provider Can you help me check my risk for a fall? Do any of my medicines make me more likely to fall? Should I take a vitamin D supplement? What exercises can I do to improve my strength and balance? Should I make an appointment to have my vision checked? Do I need a bone density test to check for weak bones (osteoporosis)? Would it help to use a cane or a walker? Where to find more information Centers for Disease Control and Prevention, [employer]: TonerPromos.no Community-Based Fall Prevention Programs: TonerPromos.no [employer] on Aging: BaseRingTones.pl Contact a health care provider if: You fall at home. You are afraid of falling at home. You feel weak, drowsy, or dizzy. This information is not intended to replace advice given to you by your health care provider.  Make sure you discuss any questions you have with your health care provider. Document Revised: 04/28/2022 Document Reviewed: 04/28/2022 Elsevier Patient Education  2024 [employer].

## 2023-06-10 NOTE — Progress Notes (Signed)
 Because this visit was a virtual/telehealth visit,  certain criteria was not obtained, such a blood pressure, CBG if applicable, and timed get up and go. Any medications not marked as "taking" were not mentioned during the medication reconciliation part of the visit. Any vitals not documented were not able to be obtained due to this being a telehealth visit or patient was unable to self-report a recent blood pressure reading due to a lack of equipment at home via telehealth. Vitals that have been documented are verbally provided by the patient.   Subjective:   Sue Wood is a 77 y.o. female who presents for Medicare Annual (Subsequent) preventive examination.  Visit Complete: Virtual  I connected with  Sue Wood on 06/10/23 by a audio enabled telemedicine application and verified that I am speaking with the correct person using two identifiers.  Patient Location: Home  Provider Location: Home Office  I discussed the limitations of evaluation and management by telemedicine. The patient expressed understanding and agreed to proceed.  Patient Medicare AWV questionnaire was completed by the patient on na; I have confirmed that all information answered by patient is correct and no changes since this date.  Cardiac Risk Factors include: advanced age (>71men, >15 women);diabetes mellitus;dyslipidemia;hypertension;obesity (BMI >30kg/m2)     Objective:    Today's Vitals   06/10/23 1104  Weight: 209 lb (94.8 kg)  Height: 5\' 7"  (1.702 m)   Body mass index is 32.73 kg/m.     06/10/2023   11:06 AM 07/04/2017    4:53 PM 12/08/2013    8:35 AM  Advanced Directives  Does Patient Have a Medical Advance Directive? No No Patient does not have advance directive;Patient would not like information  Would patient like information on creating a medical advance directive? No - Patient declined No - Patient declined   Pre-existing out of facility DNR order (yellow form or pink MOST form)   No     Current Medications (verified) Outpatient Encounter Medications as of 06/10/2023  Medication Sig   Alcohol Swabs 70 % PADS Use 1-4 times daily prn   amlodipine-olmesartan (AZOR) 10-20 MG tablet TAKE 1 TABLET BY MOUTH DAILY   aspirin 81 MG tablet Take 1 tablet (81 mg total) by mouth at bedtime.   Blood Glucose Monitoring Suppl (BLOOD GLUCOSE SYSTEM PAK) KIT Please dispense based on patient and insurance preference. Use as directed to monitor FSBS 3x daily. Dx: E11.9.   Blood Glucose Monitoring Suppl KIT Please dispense one touch ultra 2 kit. Used as directed to monitor blood glucose 3 times a day. DX: E11.9   ezetimibe (ZETIA) 10 MG tablet TAKE 1 TABLET BY MOUTH DAILY   Glucagon (GVOKE HYPOPEN 1-PACK) 0.5 MG/0.1ML SOAJ Inject 0.5 mg into the skin daily as needed.   hydrochlorothiazide (HYDRODIURIL) 25 MG tablet TAKE 1 TABLET BY MOUTH DAILY   insulin detemir (LEVEMIR FLEXPEN) 100 UNIT/ML FlexPen INJECT SUBCUTANEOUSLY 36 UNITS  IN THE MORNING AND 36 UNITS AT  NIGHT   JARDIANCE 25 MG TABS tablet TAKE 1 TABLET BY MOUTH DAILY   Lancets MISC Please dispense based on patient and insurance preference. Use as directed to monitor FSBS 3x daily. Dx: E11.9.   nortriptyline (PAMELOR) 25 MG capsule Take 1 capsule (25 mg total) by mouth at bedtime.   ONETOUCH ULTRA test strip USE TO MONITOR FASTING BLOOD  SUGAR 3 TIMES DAILY AS DIRECTED   rosuvastatin (CRESTOR) 40 MG tablet TAKE 1 TABLET BY MOUTH DAILY   tirzepatide (MOUNJARO) 15 MG/0.5ML Pen  Inject 15 mg into the skin once a week.   triamcinolone cream (KENALOG) 0.1 % Apply 1 application topically 2 (two) times daily.   No facility-administered encounter medications on file as of 06/10/2023.    Allergies (verified) Lisinopril   History: Past Medical History:  Diagnosis Date   Arthritis    Diabetes mellitus    Hyperlipidemia    Hypertension    Peripheral neuropathy    Past Surgical History:  Procedure Laterality Date   ABDOMINAL HYSTERECTOMY      CESAREAN SECTION     X 2   COLONOSCOPY N/A 12/08/2013   Procedure: COLONOSCOPY;  Surgeon: Malissa Hippo, MD;  Location: [facility];  Service: Endoscopy;  Laterality: N/A;  930   KNEE ARTHROSCOPY  1998   left   Family History  Problem Relation Age of Onset   Hypertension Mother    Hyperlipidemia Mother    Hypertension Father    Hyperlipidemia Father    Diabetes Father    Hypertension Sister    Lung disease Sister    Heart disease Sister    Stroke Sister    Emphysema Brother    Diabetes Brother    Heart disease Brother    Colon cancer Neg Hx    Breast cancer Neg Hx    Social History   Socioeconomic History   Marital status: Single    Spouse name: Not on file   Number of children: 2   Years of education: Not on file   Highest education level: Not on file  Occupational History   Not on file  Tobacco Use   Smoking status: Former    Current packs/day: 0.25    Average packs/day: 0.3 packs/day for 18.0 years (4.5 ttl pk-yrs)    Types: Cigarettes   Smokeless tobacco: Never   Tobacco comments:    quit 1980's  Substance and Sexual Activity   Alcohol use: No   Drug use: No   Sexual activity: Not Currently  Other Topics Concern   Not on file  Social History Narrative   Lives with her daughter    Social Determinants of Health   Financial Resource Strain: Low Risk  (06/10/2023)   Overall Financial Resource Strain (CARDIA)    Difficulty of Paying Living Expenses: Not hard at all  Food Insecurity: No Food Insecurity (06/10/2023)   Hunger Vital Sign    Worried About Running Out of Food in the Last Year: Never true    Ran Out of Food in the Last Year: Never true  Transportation Needs: No Transportation Needs (06/10/2023)   PRAPARE - Administrator, Civil Service (Medical): No    Lack of Transportation (Non-Medical): No  Physical Activity: Sufficiently Active (06/10/2023)   Exercise Vital Sign    Days of Exercise per Week: 7 days    Minutes of Exercise  per Session: 30 min  Stress: No Stress Concern Present (06/10/2023)   [employer] of Occupational Health - Occupational Stress Questionnaire    Feeling of Stress : Not at all  Social Connections: Socially Isolated (06/10/2023)   Social Connection and Isolation Panel [NHANES]    Frequency of Communication with Friends and Family: Three times a week    Frequency of Social Gatherings with Friends and Family: Twice a week    Attends Religious Services: Never    Database administrator or Organizations: No    Attends Banker Meetings: Never    Marital Status: Never married  Tobacco Counseling Counseling given: Yes Tobacco comments: quit 1980's   Clinical Intake:  Pre-visit preparation completed: Yes  Pain : No/denies pain     BMI - recorded: 32.73 Nutritional Status: BMI > 30  Obese Nutritional Risks: None Diabetes: Yes CBG done?: No (telehealth visit.) Did pt. bring in CBG monitor from home?: No  How often do you need to have someone help you when you read instructions, pamphlets, or other written materials from your doctor or pharmacy?: 1 - Never  Interpreter Needed?: No  Information entered by ::  Ameirah Khatoon, CMA   Activities of Daily Living    06/10/2023   11:11 AM  In your present state of health, do you have any difficulty performing the following activities:  Hearing? 0  Vision? 0  Difficulty concentrating or making decisions? 0  Walking or climbing stairs? 0  Dressing or bathing? 0  Doing errands, shopping? 0  Preparing Food and eating ? N  Using the Toilet? N  In the past six months, have you accidently leaked urine? N  Do you have problems with loss of bowel control? N  Managing your Medications? N  Managing your Finances? N  Housekeeping or managing your Housekeeping? N    Patient Care Team: Billie Lade, MD as PCP - General (Internal Medicine) Erroll Luna, [facility] (Inactive) as Pharmacist (Pharmacist) Valentino Nose,  NP (Nurse Practitioner)  Indicate any recent Medical Services you may have received from other than [facility] providers in the past year (date may be approximate).     Assessment:   This is a routine wellness examination for Sue Wood.  Hearing/Vision screen Hearing Screening - Comments:: Patient denies any hearing difficulties.   Vision Screening - Comments:: Patient does not have an eye doctor. Referral placed today.    Goals Addressed             This Visit's Progress    Patient Stated       Remain active and independent        Depression Screen    06/10/2023   11:07 AM 04/01/2023    1:28 PM 12/31/2022    1:12 PM 11/19/2022    1:12 PM 10/08/2022    1:44 PM 09/10/2022    1:30 PM 08/08/2022   10:32 AM  PHQ 2/9 Scores  PHQ - 2 Score 0 0 0 0 0 0 0  PHQ- 9 Score 0 0 0 0 0 0 0    Fall Risk    06/10/2023   11:11 AM 04/01/2023    1:28 PM 12/31/2022    1:12 PM 11/19/2022    1:12 PM 10/08/2022    1:44 PM  Fall Risk   Falls in the past year? 0 0 0 0 0  Number falls in past yr: 0 0 0 0 0  Injury with Fall? 0 0 0 0 0  Risk for fall due to : No Fall Risks No Fall Risks No Fall Risks No Fall Risks No Fall Risks  Follow up Falls prevention discussed Falls evaluation completed Falls evaluation completed Falls evaluation completed Falls evaluation completed    MEDICARE RISK AT HOME: Medicare Risk at Home Any stairs in or around the home?: No If so, are there any without handrails?: No Home free of loose throw rugs in walkways, pet beds, electrical cords, etc?: Yes Adequate lighting in your home to reduce risk of falls?: Yes Life alert?: No Use of a cane, walker or w/c?: No Grab bars in the bathroom?: No  Shower chair or bench in shower?: No Elevated toilet seat or a handicapped toilet?: No  TIMED UP AND GO:  Was the test performed?  No    Cognitive Function:        06/10/2023   11:07 AM 05/28/2022   12:04 PM  6CIT Screen  What Year? 0 points 0 points  What month? 0 points 0  points  What time? 0 points 0 points  Count back from 20 0 points 0 points  Months in reverse 0 points 0 points  Repeat phrase 0 points 4 points  Total Score 0 points 4 points    Immunizations Immunization History  Administered Date(s) Administered   Fluad Quad(high Dose 65+) 09/12/2019, 08/15/2020, 05/15/2022   Influenza, High Dose Seasonal PF 06/30/2017   Influenza,inj,Quad PF,6+ Mos 06/24/2013, 05/23/2014, 05/09/2015, 08/13/2016, 05/11/2018   Influenza-Unspecified 09/12/2019   Moderna SARS-COV2 Booster Vaccination 02/12/2021   Moderna Sars-Covid-2 Vaccination 11/19/2019, 12/21/2019, 08/15/2020   Pneumococcal Conjugate-13 02/06/2015   Pneumococcal Polysaccharide-23 04/22/2016   Tdap 08/31/2013    TDAP status: Up to date  Flu Vaccine status: Due, Education has been provided regarding the importance of this vaccine. Advised may receive this vaccine at local pharmacy or Health Dept. Aware to provide a copy of the vaccination record if obtained from local pharmacy or Health Dept. Verbalized acceptance and understanding.  Pneumococcal vaccine status: Up to date  Covid-19 vaccine status: Completed vaccines  Qualifies for Shingles Vaccine? Yes   Zostavax completed No   Shingrix Completed?: No.    Education has been provided regarding the importance of this vaccine. Patient has been advised to call insurance company to determine out of pocket expense if they have not yet received this vaccine. Advised may also receive vaccine at local pharmacy or Health Dept. Verbalized acceptance and understanding.  Screening Tests Health Maintenance  Topic Date Due   Zoster Vaccines- Shingrix (1 of 2) Never done   OPHTHALMOLOGY EXAM  06/25/2019   FOOT EXAM  04/05/2023   INFLUENZA VACCINE  04/09/2023   Medicare Annual Wellness (AWV)  05/29/2023   DTaP/Tdap/Td (2 - Td or Tdap) 09/01/2023   HEMOGLOBIN A1C  10/02/2023   Colonoscopy  12/09/2023   Diabetic kidney evaluation - eGFR measurement   03/31/2024   Diabetic kidney evaluation - Urine ACR  03/31/2024   Pneumonia Vaccine 31+ Years old  Completed   DEXA SCAN  Completed   Hepatitis C Screening  Completed   HPV VACCINES  Aged Out   COVID-19 Vaccine  Discontinued    Health Maintenance  Health Maintenance Due  Topic Date Due   Zoster Vaccines- Shingrix (1 of 2) Never done   OPHTHALMOLOGY EXAM  06/25/2019   FOOT EXAM  04/05/2023   INFLUENZA VACCINE  04/09/2023   Medicare Annual Wellness (AWV)  05/29/2023    Colorectal cancer screening: Type of screening: Colonoscopy. Completed 12/08/2013. Repeat every 10 years  Mammogram status: Ordered 06/10/2023. Pt provided with contact info and advised to call to schedule appt.   Bone Density status: Completed 11/22/2019. Results reflect: Bone density results: NORMAL. Repeat every 5 years.  Lung Cancer Screening: (Low Dose CT Chest recommended if Age 48-80 years, 20 pack-year currently smoking OR have quit w/in 15years.) does not qualify.   Lung Cancer Screening Referral: na  Additional Screening:  Hepatitis C Screening: does not qualify; Completed 06/30/2017  Vision Screening: Recommended annual ophthalmology exams for early detection of glaucoma and other disorders of the eye. Is the patient up to date with their annual  eye exam?  No  Who is the provider or what is the name of the office in which the patient attends annual eye exams? Referral placed If pt is not established with a provider, would they like to be referred to a provider to establish care? Yes .   Dental Screening: Recommended annual dental exams for proper oral hygiene  Diabetic Foot Exam: Diabetic Foot Exam: Overdue, Pt has been advised about the importance in completing this exam. Pt is scheduled for diabetic foot exam on 07/02/2023.  Community Resource Referral / Chronic Care Management: CRR required this visit?  No   CCM required this visit?  No     Plan:     I have personally reviewed and noted the  following in the patient's chart:   Medical and social history Use of alcohol, tobacco or illicit drugs  Current medications and supplements including opioid prescriptions. Patient is not currently taking opioid prescriptions. Functional ability and status Nutritional status Physical activity Advanced directives List of other physicians Hospitalizations, surgeries, and ER visits in previous 12 months Vitals Screenings to include cognitive, depression, and falls Referrals and appointments  In addition, I have reviewed and discussed with patient certain preventive protocols, quality metrics, and best practice recommendations. A written personalized care plan for preventive services as well as general preventive health recommendations were provided to patient.     Jordan Hawks Isha Seefeld, CMA   06/10/2023   After Visit Summary: (Mail) Due to this being a telephonic visit, the after visit summary with patients personalized plan was offered to patient via mail   Nurse Notes: see routing comment

## 2023-07-02 ENCOUNTER — Encounter: Payer: Self-pay | Admitting: Internal Medicine

## 2023-07-02 ENCOUNTER — Ambulatory Visit: Payer: Medicare Other | Admitting: Internal Medicine

## 2023-07-02 VITALS — BP 127/74 | HR 85 | Temp 98.7°F | Ht 67.0 in | Wt 204.4 lb

## 2023-07-02 DIAGNOSIS — E559 Vitamin D deficiency, unspecified: Secondary | ICD-10-CM | POA: Diagnosis not present

## 2023-07-02 DIAGNOSIS — Z794 Long term (current) use of insulin: Secondary | ICD-10-CM | POA: Diagnosis not present

## 2023-07-02 DIAGNOSIS — E0840 Diabetes mellitus due to underlying condition with diabetic neuropathy, unspecified: Secondary | ICD-10-CM | POA: Diagnosis not present

## 2023-07-02 DIAGNOSIS — Z23 Encounter for immunization: Secondary | ICD-10-CM | POA: Diagnosis not present

## 2023-07-02 DIAGNOSIS — N1831 Chronic kidney disease, stage 3a: Secondary | ICD-10-CM

## 2023-07-02 DIAGNOSIS — I10 Essential (primary) hypertension: Secondary | ICD-10-CM | POA: Diagnosis not present

## 2023-07-02 NOTE — Assessment & Plan Note (Signed)
 Remains adequately controlled on current antihypertensive regimen.  No medication changes are indicated today.

## 2023-07-02 NOTE — Addendum Note (Signed)
Addended by: Adella Hare B on: 07/02/2023 02:12 PM   Modules accepted: Orders

## 2023-07-02 NOTE — Progress Notes (Signed)
Established Patient Office Visit  Subjective   Patient ID: Sue Wood, female    DOB: December 29, 1945  Age: 77 y.o. MRN: [id number]  Chief Complaint  Patient presents with   Medical Management of Chronic Issues   Sue Wood returns to care today for routine follow-up.  She was last evaluated by me on 7/24.  No medication changes were made at that time and 81-month follow-up was arranged.  There have been no acute interval events.  Sue Wood reports feeling well today.  She is asymptomatic and has no acute concerns to discuss.  Past Medical History:  Diagnosis Date   Arthritis    Diabetes mellitus    Hyperlipidemia    Hypertension    Peripheral neuropathy    Past Surgical History:  Procedure Laterality Date   ABDOMINAL HYSTERECTOMY     CESAREAN SECTION     X 2   COLONOSCOPY N/A 12/08/2013   Procedure: COLONOSCOPY;  Surgeon: Malissa Hippo, MD;  Location: [facility];  Service: Endoscopy;  Laterality: N/A;  930   KNEE ARTHROSCOPY  1998   left   Social History   Tobacco Use   Smoking status: Former    Current packs/day: 0.25    Average packs/day: 0.3 packs/day for 18.0 years (4.5 ttl pk-yrs)    Types: Cigarettes   Smokeless tobacco: Never   Tobacco comments:    quit 1980's  Substance Use Topics   Alcohol use: No   Drug use: No   Family History  Problem Relation Age of Onset   Hypertension Mother    Hyperlipidemia Mother    Hypertension Father    Hyperlipidemia Father    Diabetes Father    Hypertension Sister    Lung disease Sister    Heart disease Sister    Stroke Sister    Emphysema Brother    Diabetes Brother    Heart disease Brother    Colon cancer Neg Hx    Breast cancer Neg Hx    Allergies  Allergen Reactions   Lisinopril Swelling    Lip swelling    Review of Systems  Constitutional:  Negative for chills and fever.  HENT:  Negative for sore throat.   Respiratory:  Negative for cough and shortness of breath.   Cardiovascular:  Negative  for chest pain, palpitations and leg swelling.  Gastrointestinal:  Negative for abdominal pain, blood in stool, constipation, diarrhea, nausea and vomiting.  Genitourinary:  Negative for dysuria and hematuria.  Musculoskeletal:  Negative for myalgias.  Skin:  Negative for itching and rash.  Neurological:  Negative for dizziness and headaches.  Psychiatric/Behavioral:  Negative for depression and suicidal ideas.      Objective:     BP 127/74   Pulse 85   Temp 98.7 F (37.1 C)   Ht 5\' 7"  (1.702 m)   Wt 204 lb 6.4 oz (92.7 kg)   SpO2 98%   BMI 32.01 kg/m  BP Readings from Last 3 Encounters:  07/02/23 127/74  04/01/23 116/71  12/31/22 139/73   Physical Exam Vitals reviewed.  Constitutional:      General: She is not in acute distress.    Appearance: Normal appearance. She is obese. She is not toxic-appearing.  HENT:     Head: Normocephalic and atraumatic.     Right Ear: External ear normal.     Left Ear: External ear normal.     Nose: Nose normal. No congestion or rhinorrhea.     Mouth/Throat:  Mouth: Mucous membranes are moist.     Pharynx: Oropharynx is clear. No oropharyngeal exudate or posterior oropharyngeal erythema.  Eyes:     General: No scleral icterus.    Extraocular Movements: Extraocular movements intact.     Conjunctiva/sclera: Conjunctivae normal.     Pupils: Pupils are equal, round, and reactive to light.  Cardiovascular:     Rate and Rhythm: Normal rate and regular rhythm.     Pulses: Normal pulses.     Heart sounds: Normal heart sounds. No murmur heard.    No friction rub. No gallop.  Pulmonary:     Effort: Pulmonary effort is normal.     Breath sounds: Normal breath sounds. No wheezing, rhonchi or rales.  Abdominal:     General: Abdomen is flat. Bowel sounds are normal. There is no distension.     Palpations: Abdomen is soft.     Tenderness: There is no abdominal tenderness.  Musculoskeletal:        General: No swelling. Normal range of motion.      Cervical back: Normal range of motion.     Right lower leg: No edema.     Left lower leg: No edema.  Lymphadenopathy:     Cervical: No cervical adenopathy.  Skin:    General: Skin is warm and dry.     Capillary Refill: Capillary refill takes less than 2 seconds.     Coloration: Skin is not jaundiced.  Neurological:     General: No focal deficit present.     Mental Status: She is alert and oriented to person, place, and time.  Psychiatric:        Mood and Affect: Mood normal.        Behavior: Behavior normal.    Diabetic foot exam was performed.  No deformities or other abnormal visual findings.  Posterior tibialis and dorsalis pulse intact bilaterally.  Intact to touch and monofilament testing bilaterally.    Last CBC Lab Results  Component Value Date   WBC 5.0 04/01/2023   HGB 11.7 04/01/2023   HCT 35.7 04/01/2023   MCV 85 04/01/2023   MCH 27.8 04/01/2023   RDW 14.5 04/01/2023   PLT 232 04/01/2023   Last metabolic panel Lab Results  Component Value Date   GLUCOSE 134 (H) 04/01/2023   NA 143 04/01/2023   K 4.1 04/01/2023   CL 106 04/01/2023   CO2 18 (L) 04/01/2023   BUN 14 04/01/2023   CREATININE 1.18 (H) 04/01/2023   EGFR 48 (L) 04/01/2023   CALCIUM 9.4 04/01/2023   PROT 7.6 04/01/2023   ALBUMIN 4.3 04/01/2023   LABGLOB 3.3 04/01/2023   AGRATIO 1.2 04/04/2022   BILITOT <0.2 04/01/2023   ALKPHOS 75 04/01/2023   AST 19 04/01/2023   ALT 19 04/01/2023   Last lipids Lab Results  Component Value Date   CHOL 137 04/01/2023   HDL 49 04/01/2023   LDLCALC 73 04/01/2023   TRIG 78 04/01/2023   CHOLHDL 2.8 04/01/2023   Last hemoglobin A1c Lab Results  Component Value Date   HGBA1C 7.3 (H) 04/01/2023   Last thyroid functions Lab Results  Component Value Date   TSH 2.320 04/01/2023   Last vitamin D Lab Results  Component Value Date   VD25OH 26.2 (L) 04/01/2023   Last vitamin B12 and Folate Lab Results  Component Value Date   VITAMINB12 477  04/01/2023   FOLATE 4.7 04/01/2023   The 10-year ASCVD risk score (Arnett DK, et al., 2019) is: 23.1%  Assessment & Plan:   Problem List Items Addressed This Visit       Essential hypertension, benign - Primary    Remains adequately controlled on current antihypertensive regimen.  No medication changes are indicated today.      Diabetes mellitus with neurological manifestation (HCC)    A1c 7.3 on labs from July, increased from 6.7 previously.  Mounjaro was increased to 15 mg weekly.  She is additionally prescribed Jardiance 25 mg daily and Levemir 26 units twice daily.  She checks her blood sugar regularly and reports readings ranging 90-122. -No medication changes today.  Repeat A1c at follow-up in 3 months. -Diabetic foot exam completed today.      Chronic kidney disease, stage 3a (HCC)    Recent labs remain consistent with CKD 3A.  Microalbuminuria noted on recent urine study.  She is currently prescribed olmesartan and Jardiance.  Repeat BMP at follow-up in 3 months.      Need for immunization against influenza    Influenza vaccine administered today      Vitamin D insufficiency    Noted on recent labs.  I recommended starting daily vitamin D supplementation.       Return in about 3 months (around 10/02/2023).    Billie Lade, MD

## 2023-07-02 NOTE — Assessment & Plan Note (Signed)
Recent labs remain consistent with CKD 3A.  Microalbuminuria noted on recent urine study.  She is currently prescribed olmesartan and Jardiance.  Repeat BMP at follow-up in 3 months.

## 2023-07-02 NOTE — Assessment & Plan Note (Signed)
A1c 7.3 on labs from July, increased from 6.7 previously.  Mounjaro was increased to 15 mg weekly.  She is additionally prescribed Jardiance 25 mg daily and Levemir 26 units twice daily.  She checks her blood sugar regularly and reports readings ranging 90-122. -No medication changes today.  Repeat A1c at follow-up in 3 months. -Diabetic foot exam completed today.

## 2023-07-02 NOTE — Patient Instructions (Signed)
It was a pleasure to see you today.  Thank you for giving [place] the opportunity to be involved in your care.  Below is a brief recap of your visit and next steps.  We will plan to see you again in 3 months.  Summary No medication changes today Flu shot received Follow up in 3 months

## 2023-07-02 NOTE — Assessment & Plan Note (Signed)
Noted on recent labs.  I recommended starting daily vitamin D supplementation.

## 2023-07-02 NOTE — Assessment & Plan Note (Signed)
Influenza vaccine administered today.

## 2023-07-05 ENCOUNTER — Other Ambulatory Visit: Payer: Self-pay | Admitting: Internal Medicine

## 2023-07-05 DIAGNOSIS — E1143 Type 2 diabetes mellitus with diabetic autonomic (poly)neuropathy: Secondary | ICD-10-CM

## 2023-07-19 ENCOUNTER — Other Ambulatory Visit: Payer: Self-pay | Admitting: Internal Medicine

## 2023-07-19 DIAGNOSIS — I10 Essential (primary) hypertension: Secondary | ICD-10-CM

## 2023-07-28 ENCOUNTER — Other Ambulatory Visit: Payer: Self-pay | Admitting: Internal Medicine

## 2023-07-28 DIAGNOSIS — Z794 Long term (current) use of insulin: Secondary | ICD-10-CM

## 2023-09-07 ENCOUNTER — Other Ambulatory Visit: Payer: Self-pay | Admitting: Internal Medicine

## 2023-09-14 ENCOUNTER — Other Ambulatory Visit: Payer: Self-pay | Admitting: Internal Medicine

## 2023-09-14 DIAGNOSIS — E782 Mixed hyperlipidemia: Secondary | ICD-10-CM

## 2023-10-02 ENCOUNTER — Encounter: Payer: Self-pay | Admitting: Internal Medicine

## 2023-10-02 ENCOUNTER — Ambulatory Visit (INDEPENDENT_AMBULATORY_CARE_PROVIDER_SITE_OTHER): Payer: Medicare Other | Admitting: Internal Medicine

## 2023-10-02 VITALS — BP 132/76 | HR 88 | Resp 16 | Ht 67.0 in | Wt 201.0 lb

## 2023-10-02 DIAGNOSIS — E1143 Type 2 diabetes mellitus with diabetic autonomic (poly)neuropathy: Secondary | ICD-10-CM

## 2023-10-02 DIAGNOSIS — Z1211 Encounter for screening for malignant neoplasm of colon: Secondary | ICD-10-CM | POA: Diagnosis not present

## 2023-10-02 DIAGNOSIS — N1831 Chronic kidney disease, stage 3a: Secondary | ICD-10-CM

## 2023-10-02 DIAGNOSIS — Z794 Long term (current) use of insulin: Secondary | ICD-10-CM

## 2023-10-02 DIAGNOSIS — I10 Essential (primary) hypertension: Secondary | ICD-10-CM

## 2023-10-02 MED ORDER — LEVEMIR FLEXPEN 100 UNIT/ML ~~LOC~~ SOPN: PEN_INJECTOR | SUBCUTANEOUS | 2 refills | Status: DC

## 2023-10-02 NOTE — Assessment & Plan Note (Signed)
Colonoscopy last completed in April 2015.  Repeat recommended for 10 years.  Discussed with patient as she is 58 and will turn 66 in March.  She would like to proceed with repeat colonoscopy.  Through shared decision-making, referral to gastroenterology was placed today.

## 2023-10-02 NOTE — Progress Notes (Signed)
Established Patient Office Visit  Subjective   Patient ID: Sue Wood, female    DOB: 05-07-1946  Age: 78 y.o. MRN: [id number]  Chief Complaint  Patient presents with   Follow-up    3 month follow up for Diabetes and HTN   Sue Wood returns to care today for routine follow-up.  She was last evaluated by me in October 2024.  No medication changes were made at that time and 70-month follow-up was arranged.  There have been no acute interval events.  [name] reports feeling well today.  She is asymptomatic and has no acute concerns to discuss.  Past Medical History:  Diagnosis Date   Arthritis    Diabetes mellitus    Hyperlipidemia    Hypertension    Peripheral neuropathy    Past Surgical History:  Procedure Laterality Date   ABDOMINAL HYSTERECTOMY     CESAREAN SECTION     X 2   COLONOSCOPY N/A 12/08/2013   Procedure: COLONOSCOPY;  Surgeon: Malissa Hippo, MD;  Location: [facility];  Service: Endoscopy;  Laterality: N/A;  930   KNEE ARTHROSCOPY  1998   left   Social History   Tobacco Use   Smoking status: Former    Current packs/day: 0.25    Average packs/day: 0.3 packs/day for 18.0 years (4.5 ttl pk-yrs)    Types: Cigarettes   Smokeless tobacco: Never   Tobacco comments:    quit 1980's  Substance Use Topics   Alcohol use: No   Drug use: No   Family History  Problem Relation Age of Onset   Hypertension Mother    Hyperlipidemia Mother    Hypertension Father    Hyperlipidemia Father    Diabetes Father    Hypertension Sister    Lung disease Sister    Heart disease Sister    Stroke Sister    Emphysema Brother    Diabetes Brother    Heart disease Brother    Colon cancer Neg Hx    Breast cancer Neg Hx    Allergies  Allergen Reactions   Lisinopril Swelling    Lip swelling    Review of Systems  Constitutional:  Negative for chills and fever.  HENT:  Negative for sore throat.   Respiratory:  Negative for cough and shortness of breath.    Cardiovascular:  Negative for chest pain, palpitations and leg swelling.  Gastrointestinal:  Negative for abdominal pain, blood in stool, constipation, diarrhea, nausea and vomiting.  Genitourinary:  Negative for dysuria and hematuria.  Musculoskeletal:  Negative for myalgias.  Skin:  Negative for itching and rash.  Neurological:  Negative for dizziness and headaches.  Psychiatric/Behavioral:  Negative for depression and suicidal ideas.      Objective:     BP 132/76   Pulse 88   Resp 16   Ht 5\' 7"  (1.702 m)   Wt 201 lb (91.2 kg)   SpO2 97%   BMI 31.48 kg/m  BP Readings from Last 3 Encounters:  10/02/23 132/76  07/02/23 127/74  04/01/23 116/71   Physical Exam Vitals reviewed.  Constitutional:      General: She is not in acute distress.    Appearance: Normal appearance. She is obese. She is not toxic-appearing.  HENT:     Head: Normocephalic and atraumatic.     Right Ear: External ear normal.     Left Ear: External ear normal.     Nose: Nose normal. No congestion or rhinorrhea.  Mouth/Throat:     Mouth: Mucous membranes are moist.     Pharynx: Oropharynx is clear. No oropharyngeal exudate or posterior oropharyngeal erythema.  Eyes:     General: No scleral icterus.    Extraocular Movements: Extraocular movements intact.     Conjunctiva/sclera: Conjunctivae normal.     Pupils: Pupils are equal, round, and reactive to light.  Cardiovascular:     Rate and Rhythm: Normal rate and regular rhythm.     Pulses: Normal pulses.     Heart sounds: Normal heart sounds. No murmur heard.    No friction rub. No gallop.  Pulmonary:     Effort: Pulmonary effort is normal.     Breath sounds: Normal breath sounds. No wheezing, rhonchi or rales.  Abdominal:     General: Abdomen is flat. Bowel sounds are normal. There is no distension.     Palpations: Abdomen is soft.     Tenderness: There is no abdominal tenderness.  Musculoskeletal:        General: No swelling. Normal range of  motion.     Cervical back: Normal range of motion.     Right lower leg: No edema.     Left lower leg: No edema.  Lymphadenopathy:     Cervical: No cervical adenopathy.  Skin:    General: Skin is warm and dry.     Capillary Refill: Capillary refill takes less than 2 seconds.     Coloration: Skin is not jaundiced.  Neurological:     General: No focal deficit present.     Mental Status: She is alert and oriented to person, place, and time.  Psychiatric:        Mood and Affect: Mood normal.        Behavior: Behavior normal.   Last CBC Lab Results  Component Value Date   WBC 5.0 04/01/2023   HGB 11.7 04/01/2023   HCT 35.7 04/01/2023   MCV 85 04/01/2023   MCH 27.8 04/01/2023   RDW 14.5 04/01/2023   PLT 232 04/01/2023   Last metabolic panel Lab Results  Component Value Date   GLUCOSE 134 (H) 04/01/2023   NA 143 04/01/2023   K 4.1 04/01/2023   CL 106 04/01/2023   CO2 18 (L) 04/01/2023   BUN 14 04/01/2023   CREATININE 1.18 (H) 04/01/2023   EGFR 48 (L) 04/01/2023   CALCIUM 9.4 04/01/2023   PROT 7.6 04/01/2023   ALBUMIN 4.3 04/01/2023   LABGLOB 3.3 04/01/2023   AGRATIO 1.2 04/04/2022   BILITOT <0.2 04/01/2023   ALKPHOS 75 04/01/2023   AST 19 04/01/2023   ALT 19 04/01/2023   Last lipids Lab Results  Component Value Date   CHOL 137 04/01/2023   HDL 49 04/01/2023   LDLCALC 73 04/01/2023   TRIG 78 04/01/2023   CHOLHDL 2.8 04/01/2023   Last hemoglobin A1c Lab Results  Component Value Date   HGBA1C 7.3 (H) 04/01/2023   Last thyroid functions Lab Results  Component Value Date   TSH 2.320 04/01/2023   Last vitamin D Lab Results  Component Value Date   VD25OH 26.2 (L) 04/01/2023   Last vitamin B12 and Folate Lab Results  Component Value Date   VITAMINB12 477 04/01/2023   FOLATE 4.7 04/01/2023   The 10-year ASCVD risk score (Arnett DK, et al., 2019) is: 24.4%    Assessment & Plan:   Problem List Items Addressed This Visit       Essential hypertension,  benign   Remains adequately controlled  on current antihypertensive regimen.  No medication changes are indicated today.      Diabetes mellitus with neurological manifestation (HCC) - Primary   A1c 7.3 on labs from July 2024.  She remains on Mounjaro 15 mg weekly, Jardiance 25 mg daily, and Levemir 26 units twice daily.  She reports that her home blood sugar readings remain within goal. -Repeat A1c ordered today      Chronic kidney disease, stage 3a (HCC)   CKD 3A.  Microalbuminuria noted on recent urine study.  Currently on ARB and SGLT2i. -Repeat BMP ordered today      Colon cancer screening   Colonoscopy last completed in April 2015.  Repeat recommended for 10 years.  Discussed with patient as she is 15 and will turn 26 in March.  She would like to proceed with repeat colonoscopy.  Through shared decision-making, referral to gastroenterology was placed today.       Return in about 3 months (around 12/31/2023).    Billie Lade, MD

## 2023-10-02 NOTE — Assessment & Plan Note (Signed)
CKD 3A.  Microalbuminuria noted on recent urine study.  Currently on ARB and SGLT2i. -Repeat BMP ordered today

## 2023-10-02 NOTE — Patient Instructions (Signed)
It was a pleasure to see you today.  Thank you for giving [place] the opportunity to be involved in your care.  Below is a brief recap of your visit and next steps.  We will plan to see you again in 3 months.  Summary No medication changes today Repeat labs ordered Follow up in 3 months

## 2023-10-02 NOTE — Assessment & Plan Note (Signed)
Remains adequately controlled on current antihypertensive regimen.  No medication changes are indicated today.

## 2023-10-02 NOTE — Assessment & Plan Note (Signed)
A1c 7.3 on labs from July 2024.  She remains on Mounjaro 15 mg weekly, Jardiance 25 mg daily, and Levemir 26 units twice daily.  She reports that her home blood sugar readings remain within goal. -Repeat A1c ordered today

## 2023-10-03 LAB — HEMOGLOBIN A1C
Est. average glucose Bld gHb Est-mCnc: 134 mg/dL
Hgb A1c MFr Bld: 6.3 % — ABNORMAL HIGH (ref 4.8–5.6)

## 2023-10-03 LAB — BASIC METABOLIC PANEL
BUN/Creatinine Ratio: 10 — ABNORMAL LOW (ref 12–28)
BUN: 11 mg/dL (ref 8–27)
CO2: 21 mmol/L (ref 20–29)
Calcium: 9.7 mg/dL (ref 8.7–10.3)
Chloride: 104 mmol/L (ref 96–106)
Creatinine, Ser: 1.05 mg/dL — ABNORMAL HIGH (ref 0.57–1.00)
Glucose: 94 mg/dL (ref 70–99)
Potassium: 4.4 mmol/L (ref 3.5–5.2)
Sodium: 141 mmol/L (ref 134–144)
eGFR: 55 mL/min/{1.73_m2} — ABNORMAL LOW (ref >59)

## 2023-10-05 ENCOUNTER — Telehealth: Payer: Self-pay

## 2023-10-05 ENCOUNTER — Other Ambulatory Visit: Payer: Self-pay | Admitting: Internal Medicine

## 2023-10-05 DIAGNOSIS — I10 Essential (primary) hypertension: Secondary | ICD-10-CM

## 2023-10-05 DIAGNOSIS — Z794 Long term (current) use of insulin: Secondary | ICD-10-CM

## 2023-10-05 MED ORDER — INSULIN GLARGINE 100 UNIT/ML SOLOSTAR PEN: 26.0000 [IU] | PEN_INJECTOR | Freq: Two times a day (BID) | SUBCUTANEOUS | 11 refills | Status: DC

## 2023-10-05 NOTE — Telephone Encounter (Signed)
Copied from CRM [id number]. Topic: Clinical - Prescription Issue >> Oct 02, 2023  2:45 PM Geroge Baseman wrote: Reason for CRM: [facility] pharmacy states the [facility]) 100 UNIT/ML FlexPen is no longer available. Also needs clarification on how the patient should use medication as there are two different dosages stated on two different documents. [phone] please call to advise

## 2023-10-05 NOTE — Telephone Encounter (Signed)
Patient advised.

## 2023-11-27 ENCOUNTER — Encounter (INDEPENDENT_AMBULATORY_CARE_PROVIDER_SITE_OTHER): Payer: Self-pay | Admitting: *Deleted

## 2023-11-30 ENCOUNTER — Telehealth: Payer: Self-pay | Admitting: Internal Medicine

## 2023-11-30 NOTE — Telephone Encounter (Signed)
 Patient goes to [facility] for her eye appointments

## 2023-11-30 NOTE — Telephone Encounter (Signed)
Faxed for records

## 2023-12-17 ENCOUNTER — Telehealth (INDEPENDENT_AMBULATORY_CARE_PROVIDER_SITE_OTHER): Payer: Self-pay | Admitting: Gastroenterology

## 2023-12-17 NOTE — Telephone Encounter (Signed)
 Who is your primary care physician: Dr.Dixon  Reasons for the colonoscopy:   Have you had a colonoscopy before?  yes  Do you have family history of colon cancer? no  Previous colonoscopy with polyps removed? yes  Do you have a history colorectal cancer?   no  Are you diabetic? If yes, Type 1 or Type 2?    Yes type 2  Do you have a prosthetic or mechanical heart valve? no  Do you have a pacemaker/defibrillator?   no  Have you had endocarditis/atrial fibrillation? no  Have you had joint replacement within the last 12 months?  no  Do you tend to be constipated or have to use laxatives? no  Do you have any history of drugs or alchohol?  no  Do you use supplemental oxygen?  no  Have you had a stroke or heart attack within the last 6 months? no  Do you take weight loss medication?  no  For female patients: have you had a hysterectomy?  yes                                     are you post menopausal?       no                                            do you still have your menstrual cycle? no      Do you take any blood-thinning medications such as: (aspirin, warfarin, Plavix, Aggrenox)  yes  If yes we need the name, milligram, dosage and who is prescribing doctor bayer aspirin 81 mg Current Outpatient Medications on File Prior to Visit  Medication Sig Dispense Refill   Alcohol Swabs 70 % PADS Use 1-4 times daily prn 200 each 3   amlodipine-olmesartan (AZOR) 10-20 MG tablet TAKE 1 TABLET BY MOUTH DAILY 90 tablet 3   aspirin 81 MG tablet Take 1 tablet (81 mg total) by mouth at bedtime. 90 tablet 1   Blood Glucose Monitoring Suppl (BLOOD GLUCOSE SYSTEM PAK) KIT Please dispense based on patient and insurance preference. Use as directed to monitor FSBS 3x daily. Dx: E11.9. 1 each 1   Blood Glucose Monitoring Suppl KIT Please dispense one touch ultra 2 kit. Used as directed to monitor blood glucose 3 times a day. DX: E11.9 1 kit 0   ezetimibe (ZETIA) 10 MG tablet TAKE 1 TABLET BY  MOUTH DAILY 100 tablet 2   insulin detemir (LEVEMIR) 100 UNIT/ML injection Inject 30 Units into the skin daily. 30 unit in morning and 36 bedtime     Lancets MISC Please dispense based on patient and insurance preference. Use as directed to monitor FSBS 3x daily. Dx: E11.9. 100 each 11   MOUNJARO 15 MG/0.5ML Pen INJECT THE CONTENTS OF ONE PEN  SUBCUTANEOUSLY WEEKLY AS  DIRECTED 6 mL 3   ONETOUCH ULTRA test strip USE TO MONITOR FASTING BLOOD  SUGAR 3 TIMES DAILY AS DIRECTED 300 strip 2   rosuvastatin (CRESTOR) 40 MG tablet TAKE 1 TABLET BY MOUTH DAILY 100 tablet 2   Glucagon (GVOKE HYPOPEN 1-PACK) 0.5 MG/0.1ML SOAJ Inject 0.5 mg into the skin daily as needed. (Patient not taking: Reported on 12/17/2023) 0.2 mL 0   hydrochlorothiazide (HYDRODIURIL) 25 MG tablet TAKE 1 TABLET BY MOUTH DAILY (Patient  not taking: Reported on 12/17/2023) 90 tablet 3   insulin glargine (LANTUS) 100 UNIT/ML Solostar Pen Inject 26 Units into the skin 2 (two) times daily. (Patient not taking: Reported on 12/17/2023) 15 mL 11   JARDIANCE 25 MG TABS tablet TAKE 1 TABLET BY MOUTH DAILY (Patient not taking: Reported on 12/17/2023) 90 tablet 3   nortriptyline (PAMELOR) 25 MG capsule Take 1 capsule (25 mg total) by mouth at bedtime. (Patient not taking: Reported on 12/17/2023) 90 capsule 3   triamcinolone cream (KENALOG) 0.1 % Apply 1 application topically 2 (two) times daily. (Patient not taking: Reported on 12/17/2023) 30 g 0   No current facility-administered medications on file prior to visit.    Allergies  Allergen Reactions   Lisinopril Swelling    Lip swelling      Pharmacy: [facility]  Primary Insurance Name: Medicare  Best number where you can be reached: [phone]

## 2023-12-17 NOTE — Telephone Encounter (Signed)
 Ok to schedule.  Room 1/2, with extended bowel prep ( 10 days of miralax twice daily ) and full day of bowel prep the day before procedure   Thanks,  Vista Lawman, MD Gastroenterology and Hepatology [facility]

## 2023-12-29 NOTE — Telephone Encounter (Signed)
 Would you like pt to do our 2 day prep which is  At 2:00 pm: Take two Dulcolax laxative tablets  At 7:00 pm: mix 4 oz of Miralax Powder to 32 oz of Gaterade (NOT RED) and drink every 15 to 20 mins until gone ?  Please advise. Thank you!

## 2023-12-30 MED ORDER — PEG 3350-KCL-NA BICARB-NACL 420 G PO SOLR: 4000.0000 mL | Freq: Once | ORAL | 0 refills | Status: AC

## 2023-12-30 NOTE — Telephone Encounter (Signed)
 Hi Extended bowel prep is   10 days before the procedure patient takes miralax 17gm twice daily than the day before the procedure takes full prep ( either Nulytely/Golytely/Suprep etc)

## 2023-12-30 NOTE — Telephone Encounter (Signed)
 Pt contacted and scheduled. Instructions will be mailed to pt. Prep sent to mail order pharmacy per pt request. No pa needed per insurance.

## 2023-12-30 NOTE — Addendum Note (Signed)
 Addended by: Aslan Montagna on: 12/30/2023 03:01 PM   Modules accepted: Orders

## 2023-12-31 NOTE — Telephone Encounter (Signed)
 Questionnaire from recall, no referral needed

## 2024-01-01 ENCOUNTER — Ambulatory Visit: Payer: Medicare Other | Admitting: Internal Medicine

## 2024-01-05 ENCOUNTER — Other Ambulatory Visit: Payer: Self-pay | Admitting: Internal Medicine

## 2024-01-27 ENCOUNTER — Encounter (HOSPITAL_COMMUNITY): Payer: Self-pay

## 2024-01-27 ENCOUNTER — Encounter (HOSPITAL_COMMUNITY): Payer: Self-pay | Admitting: Gastroenterology

## 2024-01-27 ENCOUNTER — Telehealth: Payer: Self-pay | Admitting: *Deleted

## 2024-01-27 ENCOUNTER — Other Ambulatory Visit: Payer: Self-pay

## 2024-01-27 ENCOUNTER — Ambulatory Visit (HOSPITAL_COMMUNITY)
Admission: RE | Admit: 2024-01-27 | Discharge: 2024-01-27 | Disposition: A | Discharge status: Home / Self Care | Attending: Gastroenterology | Admitting: Gastroenterology

## 2024-01-27 ENCOUNTER — Encounter (HOSPITAL_COMMUNITY)
Admission: RE | Disposition: A | Discharge status: Home / Self Care | Payer: Self-pay | Source: Home / Self Care | Attending: Gastroenterology

## 2024-01-27 DIAGNOSIS — Z1211 Encounter for screening for malignant neoplasm of colon: Secondary | ICD-10-CM | POA: Diagnosis present

## 2024-01-27 DIAGNOSIS — Z538 Procedure and treatment not carried out for other reasons: Secondary | ICD-10-CM | POA: Insufficient documentation

## 2024-01-27 DIAGNOSIS — Z860100 Personal history of colon polyps, unspecified: Secondary | ICD-10-CM | POA: Diagnosis not present

## 2024-01-27 LAB — GLUCOSE, CAPILLARY: Glucose-Capillary: 96 mg/dL (ref 70–99)

## 2024-01-27 SURGERY — CANCELLED PROCEDURE
Anesthesia: Choice

## 2024-01-27 MED ORDER — PEG 3350-KCL-NA BICARB-NACL 420 G PO SOLR: 4000.0000 mL | Freq: Once | ORAL | 0 refills | Status: AC

## 2024-01-27 NOTE — Anesthesia Preprocedure Evaluation (Addendum)
 Anesthesia Evaluation  Patient identified by MRN, date of birth, ID band Patient awake  General Assessment Comment:Pt had pizza last night which may cause cancellation per endoscopist  Reviewed: Allergy & Precautions, H&P , NPO status , Patient's Chart, lab work & pertinent test results  Airway Mallampati: II  TM Distance: >3 FB Neck ROM: Full    Dental no notable dental hx.    Pulmonary former smoker   Pulmonary exam normal breath sounds clear to auscultation       Cardiovascular hypertension, Normal cardiovascular exam Rhythm:Regular Rate:Normal     Neuro/Psych  Neuromuscular disease  negative psych ROS   GI/Hepatic negative GI ROS, Neg liver ROS,,,  Endo/Other  diabetes, Type 2, Oral Hypoglycemic Agents    Renal/GU Renal disease  negative genitourinary   Musculoskeletal  (+) Arthritis ,    Abdominal   Peds negative pediatric ROS (+)  Hematology negative hematology ROS (+)   Anesthesia Other Findings   Reproductive/Obstetrics negative OB ROS                             Anesthesia Physical Anesthesia Plan  ASA: 3  Anesthesia Plan: General   Post-op Pain Management:    Induction: Intravenous  PONV Risk Score and Plan:   Airway Management Planned: Nasal Cannula  Additional Equipment:   Intra-op Plan:   Post-operative Plan:   Informed Consent: I have reviewed the patients History and Physical, chart, labs and discussed the procedure including the risks, benefits and alternatives for the proposed anesthesia with the patient or authorized representative who has indicated his/her understanding and acceptance.     Dental advisory given  Plan Discussed with: CRNA  Anesthesia Plan Comments:        Anesthesia Quick Evaluation

## 2024-01-27 NOTE — OR Nursing (Signed)
 Patient did not follow instructions and ate solid food , procedure cancelled in pre op per Dr Alita Irwin order

## 2024-01-27 NOTE — Telephone Encounter (Signed)
 Patient ate solid food steak and pizza yesterday. Procedure cancelled for today with Dr. Alita Irwin. Per Dr. Alita Irwin "extended bowel prep ( 10 days of miralax twice daily ) and full day of bowel prep the day before procedure"

## 2024-01-27 NOTE — Telephone Encounter (Signed)
 Called pt and rescheduled to 6/20 at 10am. Offered patient to come to office to discuss instructions in person but she asked I just mail them to her. I did tell her she needed to start miralax BID for 10 days prior. Expressed she needs to make sure she does not eat any solid foods starting 2 days before procedure. Advised again what is considered clear liquids. She voiced understanding. Aware of what medications that needed to be held also prior to procedure. She again voiced understanding.

## 2024-01-27 NOTE — H&P (Signed)
 Patient presented to inadequate prep with solid food the day before procedure , being rescheduled with extended bowel prep

## 2024-02-04 ENCOUNTER — Ambulatory Visit: Admitting: Internal Medicine

## 2024-02-09 ENCOUNTER — Ambulatory Visit: Admitting: Family Medicine

## 2024-02-17 ENCOUNTER — Ambulatory Visit

## 2024-02-17 ENCOUNTER — Encounter (HOSPITAL_COMMUNITY): Attending: Gastroenterology

## 2024-02-17 VITALS — BP 134/81 | HR 86 | Ht 67.0 in | Wt 196.0 lb

## 2024-02-17 DIAGNOSIS — E782 Mixed hyperlipidemia: Secondary | ICD-10-CM | POA: Diagnosis not present

## 2024-02-17 DIAGNOSIS — E1143 Type 2 diabetes mellitus with diabetic autonomic (poly)neuropathy: Secondary | ICD-10-CM

## 2024-02-17 DIAGNOSIS — Z794 Long term (current) use of insulin: Secondary | ICD-10-CM

## 2024-02-17 DIAGNOSIS — I10 Essential (primary) hypertension: Secondary | ICD-10-CM | POA: Diagnosis not present

## 2024-02-17 NOTE — Patient Instructions (Signed)
 Check labs today.  Recommend follow-up in 4 months.

## 2024-02-17 NOTE — Progress Notes (Signed)
 Established Patient Office Visit  Subjective   Patient ID: Sue Wood, female    DOB: 07-02-1946  Age: 78 y.o. MRN: [id number]  Chief Complaint  Patient presents with   Medical Management of Chronic Issues    3 month follow up    HPI  Diabetes Mellitus Type II, Follow-up  Lab Results  Component Value Date   HGBA1C 6.1 (H) 02/17/2024   HGBA1C 6.3 (H) 10/02/2023   HGBA1C 7.3 (H) 04/01/2023   Wt Readings from Last 3 Encounters:  02/17/24 196 lb (88.9 kg)  10/02/23 201 lb (91.2 kg)  07/02/23 204 lb 6.4 oz (92.7 kg)   Last seen for diabetes 5 months ago.  Management since then includes medication management. She reports good compliance with treatment. She is not having side effects.  Symptoms: No fatigue No foot ulcerations  No appetite changes No nausea  No paresthesia of the feet  No polydipsia  No polyuria No visual disturbances   No vomiting     Home blood sugar records: fasting range: 110-130  Episodes of hypoglycemia? No    Current insulin  regiment: none Most Recent Eye Exam: referral placed for eye exam 6 months ago Current exercise: housecleaning and no regular exercise Current diet habits: in general, a healthy diet  , diabetic  Pertinent Labs: Lab Results  Component Value Date   CHOL 146 02/17/2024   HDL 56 02/17/2024   LDLCALC 79 02/17/2024   TRIG 51 02/17/2024   CHOLHDL 2.6 02/17/2024   Lab Results  Component Value Date   NA 142 02/17/2024   K 4.9 02/17/2024   CREATININE 0.97 02/17/2024   EGFR 61 02/17/2024   MICRALBCREAT WILL FOLLOW 02/17/2024     --------------------------------------------------------------------------------------------------- Patient Active Problem List   Diagnosis Date Noted   Colon cancer screening 10/02/2023   Vitamin D  insufficiency 07/02/2023   Chronic kidney disease, stage 3a (HCC) 07/02/2023   Need for immunization against influenza 05/15/2022   Annual physical exam 04/04/2022   Osteoporosis  06/30/2017   Osteoarthritis, knee 12/16/2012   Diabetes mellitus with neurological manifestation (HCC) 12/31/2011   Essential hypertension, benign 12/31/2011   Hyperlipidemia 12/31/2011   Bilateral bunions 12/31/2011   Diabetic neuropathy (HCC) 12/31/2011   Class 2 severe obesity due to excess calories with serious comorbidity and body mass index (BMI) of 35.0 to 35.9 in adult (HCC)       ROS    Objective:     BP 134/81   Pulse 86   Ht 5' 7 (1.702 m)   Wt 196 lb (88.9 kg)   SpO2 98%   BMI 30.70 kg/m  BP Readings from Last 3 Encounters:  02/17/24 134/81  10/02/23 132/76  07/02/23 127/74   Wt Readings from Last 3 Encounters:  02/17/24 196 lb (88.9 kg)  10/02/23 201 lb (91.2 kg)  07/02/23 204 lb 6.4 oz (92.7 kg)      Physical Exam Vitals and nursing note reviewed.  Constitutional:      Appearance: Normal appearance.   Eyes:     Extraocular Movements: Extraocular movements intact.     Pupils: Pupils are equal, round, and reactive to light.    Cardiovascular:     Rate and Rhythm: Normal rate and regular rhythm.  Pulmonary:     Effort: Pulmonary effort is normal.     Breath sounds: Normal breath sounds.   Musculoskeletal:     Cervical back: Normal range of motion and neck supple.   Neurological:     Mental  Status: She is alert and oriented to person, place, and time.   Psychiatric:        Mood and Affect: Mood normal.        Thought Content: Thought content normal.      Results for orders placed or performed in visit on 02/17/24  Hemoglobin A1c  Result Value Ref Range   Hgb A1c MFr Bld 6.1 (H) 4.8 - 5.6 %   Est. average glucose Bld gHb Est-mCnc 128 mg/dL  Microalbumin / creatinine urine ratio  Result Value Ref Range   Creatinine, Urine WILL FOLLOW    Microalbumin, Urine WILL FOLLOW    Microalb/Creat Ratio WILL FOLLOW   Lipid panel  Result Value Ref Range   Cholesterol, Total 146 100 - 199 mg/dL   Triglycerides 51 0 - 149 mg/dL   HDL 56 >78  mg/dL   VLDL Cholesterol Cal 11 5 - 40 mg/dL   LDL Chol Calc (NIH) 79 0 - 99 mg/dL   Chol/HDL Ratio 2.6 0.0 - 4.4 ratio  CMP14+EGFR  Result Value Ref Range   Glucose 142 (H) 70 - 99 mg/dL   BUN 17 8 - 27 mg/dL   Creatinine, Ser 2.95 0.57 - 1.00 mg/dL   eGFR 61 >62 ZH/YQM/5.78   BUN/Creatinine Ratio 18 12 - 28   Sodium 142 134 - 144 mmol/L   Potassium 4.9 3.5 - 5.2 mmol/L   Chloride 103 96 - 106 mmol/L   CO2 22 20 - 29 mmol/L   Calcium  9.7 8.7 - 10.3 mg/dL   Total Protein 7.4 6.0 - 8.5 g/dL   Albumin 4.5 3.8 - 4.8 g/dL   Globulin, Total 2.9 1.5 - 4.5 g/dL   Bilirubin Total <4.6 0.0 - 1.2 mg/dL   Alkaline Phosphatase 78 44 - 121 IU/L   AST 23 0 - 40 IU/L   ALT 20 0 - 32 IU/L    Last CBC Lab Results  Component Value Date   WBC 5.0 04/01/2023   HGB 11.7 04/01/2023   HCT 35.7 04/01/2023   MCV 85 04/01/2023   MCH 27.8 04/01/2023   RDW 14.5 04/01/2023   PLT 232 04/01/2023   Last metabolic panel Lab Results  Component Value Date   GLUCOSE 142 (H) 02/17/2024   NA 142 02/17/2024   K 4.9 02/17/2024   CL 103 02/17/2024   CO2 22 02/17/2024   BUN 17 02/17/2024   CREATININE 0.97 02/17/2024   EGFR 61 02/17/2024   CALCIUM  9.7 02/17/2024   PROT 7.4 02/17/2024   ALBUMIN 4.5 02/17/2024   LABGLOB 2.9 02/17/2024   AGRATIO 1.2 04/04/2022   BILITOT <0.2 02/17/2024   ALKPHOS 78 02/17/2024   AST 23 02/17/2024   ALT 20 02/17/2024   Last lipids Lab Results  Component Value Date   CHOL 146 02/17/2024   HDL 56 02/17/2024   LDLCALC 79 02/17/2024   TRIG 51 02/17/2024   CHOLHDL 2.6 02/17/2024   Last hemoglobin A1c Lab Results  Component Value Date   HGBA1C 6.1 (H) 02/17/2024   Last thyroid  functions Lab Results  Component Value Date   TSH 2.320 04/01/2023   Last vitamin D  Lab Results  Component Value Date   VD25OH 26.2 (L) 04/01/2023      The 10-year ASCVD Wood score (Arnett DK, et al., 2019) is: 28.5%    Assessment & Plan:   Problem List Items Addressed This  Visit       Cardiovascular and Mediastinum   Essential hypertension, benign  Remains adequately controlled on current antihypertensive regimen.  No medication changes are indicated today.        Endocrine   Diabetes mellitus with neurological manifestation (HCC) - Primary   A1c 6.3 on labs from January 2025.  She remains on Mounjaro  15 mg weekly, Jardiance  25 mg daily, and Levemir  26 units twice daily.  She reports that her home blood sugar readings remain within goal. -Repeat A1c ordered today      Relevant Orders   CMP14+EGFR   Hemoglobin A1c (Completed)   Microalbumin / creatinine urine ratio (Completed)     Other   Hyperlipidemia   Relevant Orders   CMP14+EGFR   Lipid panel (Completed)    Return in about 4 months (around 06/18/2024).    Alison Irvine, FNP

## 2024-02-18 LAB — LIPID PANEL
Chol/HDL Ratio: 2.6 ratio (ref 0.0–4.4)
Cholesterol, Total: 146 mg/dL (ref 100–199)
HDL: 56 mg/dL (ref >39)
LDL Chol Calc (NIH): 79 mg/dL (ref 0–99)
Triglycerides: 51 mg/dL (ref 0–149)
VLDL Cholesterol Cal: 11 mg/dL (ref 5–40)

## 2024-02-18 LAB — MICROALBUMIN / CREATININE URINE RATIO

## 2024-02-18 LAB — CMP14+EGFR
ALT: 20 IU/L (ref 0–32)
AST: 23 IU/L (ref 0–40)
Albumin: 4.5 g/dL (ref 3.8–4.8)
Alkaline Phosphatase: 78 IU/L (ref 44–121)
BUN/Creatinine Ratio: 18 (ref 12–28)
BUN: 17 mg/dL (ref 8–27)
Bilirubin Total: <0.2 mg/dL (ref 0.0–1.2)
CO2: 22 mmol/L (ref 20–29)
Calcium: 9.7 mg/dL (ref 8.7–10.3)
Chloride: 103 mmol/L (ref 96–106)
Creatinine, Ser: 0.97 mg/dL (ref 0.57–1.00)
Globulin, Total: 2.9 g/dL (ref 1.5–4.5)
Glucose: 142 mg/dL — ABNORMAL HIGH (ref 70–99)
Potassium: 4.9 mmol/L (ref 3.5–5.2)
Sodium: 142 mmol/L (ref 134–144)
Total Protein: 7.4 g/dL (ref 6.0–8.5)
eGFR: 61 mL/min/{1.73_m2} (ref >59)

## 2024-02-18 LAB — HEMOGLOBIN A1C
Est. average glucose Bld gHb Est-mCnc: 128 mg/dL
Hgb A1c MFr Bld: 6.1 % — ABNORMAL HIGH (ref 4.8–5.6)

## 2024-02-18 NOTE — Assessment & Plan Note (Signed)
 Remains adequately controlled on current antihypertensive regimen.  No medication changes are indicated today.

## 2024-02-18 NOTE — Assessment & Plan Note (Signed)
 A1c 6.3 on labs from January 2025.  She remains on Mounjaro  15 mg weekly, Jardiance  25 mg daily, and Levemir  26 units twice daily.  She reports that her home blood sugar readings remain within goal. -Repeat A1c ordered today

## 2024-02-19 ENCOUNTER — Other Ambulatory Visit: Payer: Self-pay

## 2024-02-19 ENCOUNTER — Encounter (HOSPITAL_COMMUNITY): Payer: Self-pay | Admitting: Gastroenterology

## 2024-02-19 NOTE — Progress Notes (Signed)
 PAT phone call completed with patient over the phone for upcoming colonoscopy on 02/26/24.

## 2024-02-23 ENCOUNTER — Ambulatory Visit

## 2024-02-26 ENCOUNTER — Encounter (HOSPITAL_COMMUNITY): Payer: Self-pay | Admitting: Gastroenterology

## 2024-02-26 ENCOUNTER — Ambulatory Visit (HOSPITAL_COMMUNITY): Admitting: Anesthesiology

## 2024-02-26 ENCOUNTER — Other Ambulatory Visit: Payer: Self-pay

## 2024-02-26 ENCOUNTER — Ambulatory Visit (HOSPITAL_COMMUNITY)
Admission: RE | Admit: 2024-02-26 | Discharge: 2024-02-26 | Disposition: A | Discharge status: Home / Self Care | Attending: Gastroenterology | Admitting: Gastroenterology

## 2024-02-26 ENCOUNTER — Encounter (HOSPITAL_COMMUNITY)
Admission: RE | Disposition: A | Discharge status: Home / Self Care | Payer: Self-pay | Source: Home / Self Care | Attending: Gastroenterology

## 2024-02-26 DIAGNOSIS — D122 Benign neoplasm of ascending colon: Secondary | ICD-10-CM

## 2024-02-26 DIAGNOSIS — I10 Essential (primary) hypertension: Secondary | ICD-10-CM | POA: Insufficient documentation

## 2024-02-26 DIAGNOSIS — E119 Type 2 diabetes mellitus without complications: Secondary | ICD-10-CM | POA: Insufficient documentation

## 2024-02-26 DIAGNOSIS — Z87891 Personal history of nicotine dependence: Secondary | ICD-10-CM | POA: Insufficient documentation

## 2024-02-26 DIAGNOSIS — Z1211 Encounter for screening for malignant neoplasm of colon: Secondary | ICD-10-CM | POA: Insufficient documentation

## 2024-02-26 DIAGNOSIS — K648 Other hemorrhoids: Secondary | ICD-10-CM

## 2024-02-26 DIAGNOSIS — K5730 Diverticulosis of large intestine without perforation or abscess without bleeding: Secondary | ICD-10-CM

## 2024-02-26 DIAGNOSIS — Z794 Long term (current) use of insulin: Secondary | ICD-10-CM | POA: Insufficient documentation

## 2024-02-26 DIAGNOSIS — D120 Benign neoplasm of cecum: Secondary | ICD-10-CM

## 2024-02-26 DIAGNOSIS — Z79899 Other long term (current) drug therapy: Secondary | ICD-10-CM | POA: Diagnosis not present

## 2024-02-26 DIAGNOSIS — Z7984 Long term (current) use of oral hypoglycemic drugs: Secondary | ICD-10-CM | POA: Diagnosis not present

## 2024-02-26 DIAGNOSIS — D124 Benign neoplasm of descending colon: Secondary | ICD-10-CM | POA: Insufficient documentation

## 2024-02-26 HISTORY — PX: COLONOSCOPY: SHX5424

## 2024-02-26 LAB — GLUCOSE, CAPILLARY: Glucose-Capillary: 84 mg/dL (ref 70–99)

## 2024-02-26 LAB — HM COLONOSCOPY

## 2024-02-26 SURGERY — COLONOSCOPY
Anesthesia: General

## 2024-02-26 MED ORDER — PROPOFOL 10 MG/ML IV BOLUS
INTRAVENOUS | Status: DC | PRN
  Administered 2024-02-26: 100 mg via INTRAVENOUS

## 2024-02-26 MED ORDER — LACTATED RINGERS IV SOLN: INTRAVENOUS | Status: DC

## 2024-02-26 MED ORDER — PHENYLEPHRINE 80 MCG/ML (10ML) SYRINGE FOR IV PUSH (FOR BLOOD PRESSURE SUPPORT)
PREFILLED_SYRINGE | INTRAVENOUS | Status: DC | PRN
Start: 2024-02-26 — End: 2024-02-26
  Administered 2024-02-26 (×5): 160 ug via INTRAVENOUS

## 2024-02-26 MED ORDER — PROPOFOL 500 MG/50ML IV EMUL
INTRAVENOUS | Status: DC | PRN
  Administered 2024-02-26: 150 ug/kg/min via INTRAVENOUS

## 2024-02-26 NOTE — Anesthesia Preprocedure Evaluation (Signed)
 Anesthesia Evaluation  Patient identified by MRN, date of birth, ID band Patient awake    Reviewed: Allergy & Precautions, H&P , NPO status , Patient's Chart, lab work & pertinent test results, reviewed documented beta blocker date and time   Airway Mallampati: II  TM Distance: >3 FB Neck ROM: full    Dental no notable dental hx.    Pulmonary neg pulmonary ROS, former smoker   Pulmonary exam normal breath sounds clear to auscultation       Cardiovascular Exercise Tolerance: Good hypertension,  Rhythm:regular Rate:Normal     Neuro/Psych  Neuromuscular disease  negative psych ROS   GI/Hepatic negative GI ROS, Neg liver ROS,,,  Endo/Other  diabetes    Renal/GU Renal disease  negative genitourinary   Musculoskeletal   Abdominal   Peds  Hematology negative hematology ROS (+)   Anesthesia Other Findings   Reproductive/Obstetrics negative OB ROS                             Anesthesia Physical Anesthesia Plan  ASA: 2  Anesthesia Plan: General   Post-op Pain Management:    Induction:   PONV Risk Score and Plan: Propofol infusion  Airway Management Planned:   Additional Equipment:   Intra-op Plan:   Post-operative Plan:   Informed Consent: I have reviewed the patients History and Physical, chart, labs and discussed the procedure including the risks, benefits and alternatives for the proposed anesthesia with the patient or authorized representative who has indicated his/her understanding and acceptance.     Dental Advisory Given  Plan Discussed with: CRNA  Anesthesia Plan Comments:        Anesthesia Quick Evaluation

## 2024-02-26 NOTE — H&P (Signed)
 Primary Care Physician:  Alison Irvine, FNP Primary Gastroenterologist:  Dr. Alita Irwin  Pre-Procedure History & Physical: HPI:  Sue Wood is a 78 y.o. female is here for a colonoscopy for colon cancer screening purposes.  Patient denies any family history of colorectal cancer.  No melena or hematochezia.  No abdominal pain or unintentional weight loss.  No change in bowel habits.  Overall feels well from a GI standpoint.  Past Medical History:  Diagnosis Date   Arthritis    Diabetes mellitus    Hyperlipidemia    Hypertension    Peripheral neuropathy     Past Surgical History:  Procedure Laterality Date   ABDOMINAL HYSTERECTOMY     CESAREAN SECTION     X 2   COLONOSCOPY N/A 12/08/2013   Procedure: COLONOSCOPY;  Surgeon: Ruby Corporal, MD;  Location: [facility];  Service: Endoscopy;  Laterality: N/A;  930   KNEE ARTHROSCOPY  1998   left    Prior to Admission medications   Medication Sig Start Date End Date Taking? Authorizing Provider  aspirin  81 MG tablet Take 1 tablet (81 mg total) by mouth at bedtime. 02/12/16  Yes [place], Marvell Slider, MD  empagliflozin  (JARDIANCE ) 25 MG TABS tablet Take 25 mg by mouth daily.   Yes [provider]  ezetimibe  (ZETIA ) 10 MG tablet TAKE 1 TABLET BY MOUTH DAILY 09/15/23  Yes Tobi Fortes, MD  hydrochlorothiazide  (HYDRODIURIL ) 25 MG tablet TAKE 1 TABLET BY MOUTH DAILY 09/17/22  Yes Tobi Fortes, MD  insulin  detemir (LEVEMIR ) 100 UNIT/ML injection Inject 30 Units into the skin daily. 30 unit in morning and 36 bedtime   Yes [provider]  MOUNJARO  15 MG/0.5ML Pen INJECT THE CONTENTS OF ONE PEN  SUBCUTANEOUSLY WEEKLY AS  DIRECTED 07/28/23  Yes Tobi Fortes, MD  nortriptyline  (PAMELOR ) 25 MG capsule Take 1 capsule (25 mg total) by mouth at bedtime. 08/08/22  Yes Tobi Fortes, MD  rosuvastatin  (CRESTOR ) 40 MG tablet TAKE 1 TABLET BY MOUTH DAILY 09/08/23  Yes Dixon, Phillip E, MD  Alcohol  Swabs  70 % PADS Use 1-4 times  daily prn 02/12/16   Mathis Som, MD  amlodipine -olmesartan  (AZOR ) 10-20 MG tablet TAKE 1 TABLET BY MOUTH DAILY 10/06/23 02/17/24  Dixon, Phillip E, MD  Blood Glucose Monitoring Suppl (BLOOD GLUCOSE SYSTEM PAK) KIT Please dispense based on patient and insurance preference. Use as directed to monitor FSBS 3x daily. Dx: E11.9. 12/13/18   Mathis Som, MD  Blood Glucose Monitoring Suppl KIT Please dispense one touch ultra 2 kit. Used as directed to monitor blood glucose 3 times a day. DX: E11.9 03/18/22   Pased[place], Folashade R, FNP  Glucagon  (GVOKE HYPOPEN  1-PACK) 0.5 MG/0.1ML SOAJ Inject 0.5 mg into the skin daily as needed. 04/04/22   Paseda, Folashade R, FNP  Lancets MISC Please dispense based on patient and insurance preference. Use as directed to monitor FSBS 3x daily. Dx: E11.9. 12/13/18   Mathis Som, MD  [facility] ULTRA test strip USE TO MONITOR FASTING BLOOD  SUGAR 3 TIMES DAILY AS DIRECTED 01/06/24   Dixon, Phillip E, MD  triamcinolone  cream (KENALOG ) 0.1 % Apply 1 application topically 2 (two) times daily. 12/10/18   Mathis Som, MD    Allergies as of 01/27/2024 - Review Complete 01/27/2024  Allergen Reaction Noted   Lisinopril Swelling 12/31/2011    Family History  Problem Relation Age of Onset   Hypertension Mother    Hyperlipidemia Mother  Hypertension Father    Hyperlipidemia Father    Diabetes Father    Hypertension Sister    Lung disease Sister    Heart disease Sister    Stroke Sister    Emphysema Brother    Diabetes Brother    Heart disease Brother    Colon cancer Neg Hx    Breast cancer Neg Hx     Social History   Socioeconomic History   Marital status: Single    Spouse name: Not on file   Number of children: 2   Years of education: Not on file   Highest education level: Not on file  Occupational History   Not on file  Tobacco Use   Smoking status: Former    Current packs/day: 0.25    Average packs/day: 0.3 packs/day for 18.0 years (4.5 ttl  pk-yrs)    Types: Cigarettes   Smokeless tobacco: Never   Tobacco comments:    quit 1980's  Vaping Use   Vaping status: Never Used  Substance and Sexual Activity   Alcohol  use: No   Drug use: No   Sexual activity: Not Currently  Other Topics Concern   Not on file  Social History Narrative   Lives with her daughter    Social Drivers of Health   Financial Resource Strain: Low Risk  (06/10/2023)   Overall Financial Resource Strain (CARDIA)    Difficulty of Paying Living Expenses: Not hard at all  Food Insecurity: No Food Insecurity (06/10/2023)   Hunger Vital Sign    Worried About Running Out of Food in the Last Year: Never true    Ran Out of Food in the Last Year: Never true  Transportation Needs: No Transportation Needs (06/10/2023)   PRAPARE - Administrator, Civil Service (Medical): No    Lack of Transportation (Non-Medical): No  Physical Activity: Sufficiently Active (06/10/2023)   Exercise Vital Sign    Days of Exercise per Week: 7 days    Minutes of Exercise per Session: 30 min  Stress: No Stress Concern Present (06/10/2023)   [employer] of Occupational Health - Occupational Stress Questionnaire    Feeling of Stress : Not at all  Social Connections: Socially Isolated (06/10/2023)   Social Connection and Isolation Panel    Frequency of Communication with Friends and Family: Three times a week    Frequency of Social Gatherings with Friends and Family: Twice a week    Attends Religious Services: Never    Database administrator or Organizations: No    Attends Banker Meetings: Never    Marital Status: Never married  Intimate Partner Violence: Not At Risk (06/10/2023)   Humiliation, Afraid, Rape, and Kick questionnaire    Fear of Current or Ex-Partner: No    Emotionally Abused: No    Physically Abused: No    Sexually Abused: No    Review of Systems: See HPI, otherwise negative ROS  Physical Exam: Vital signs in last 24 hours: Temp:   [97.8 F (36.6 C)] 97.8 F (36.6 C) (06/20 0857) Pulse Rate:  [75] 75 (06/20 0857) Resp:  [16] 16 (06/20 0857) BP: (139)/(68) 139/68 (06/20 0857) SpO2:  [100 %] 100 % (06/20 0857) Weight:  [89.8 kg] 89.8 kg (06/20 0857)   General:   Alert,  Well-developed, well-nourished, pleasant and cooperative in NAD Head:  Normocephalic and atraumatic. Eyes:  Sclera clear, no icterus.   Conjunctiva pink. Ears:  Normal auditory acuity. Nose:  No deformity, discharge,  or  lesions. Msk:  Symmetrical without gross deformities. Normal posture. Extremities:  Without clubbing or edema. Neurologic:  Alert and  oriented x4;  grossly normal neurologically. Skin:  Intact without significant lesions or rashes. Psych:  Alert and cooperative. Normal mood and affect.  Impression/Plan: Sue Wood is here for a colonoscopy to be performed for colon cancer screening purposes.  The risks of the procedure including infection, bleed, or perforation as well as benefits, limitations, alternatives and imponderables have been reviewed with the patient. Questions have been answered. All parties agreeable.

## 2024-02-26 NOTE — Transfer of Care (Signed)
 Immediate Anesthesia Transfer of Care Note  Patient: Sue Wood  Procedure(s) Performed: COLONOSCOPY  Patient Location: Short Stay  Anesthesia Type:General  Level of Consciousness: awake and patient cooperative  Airway & Oxygen Therapy: Patient Spontanous Breathing  Post-op Assessment: Report given to RN and Post -op Vital signs reviewed and stable  Post vital signs: Reviewed and stable  Last Vitals:  Vitals Value Taken Time  BP 124/62 02/26/24 11:43  Temp 36.4 C 02/26/24 11:43  Pulse 69 02/26/24 11:43  Resp 16 02/26/24 11:43  SpO2 100 % 02/26/24 11:43    Last Pain:  Vitals:   02/26/24 1143  TempSrc: Oral  PainSc: 0-No pain      Patients Stated Pain Goal: 5 (02/26/24 0857)  Complications: No notable events documented.

## 2024-02-26 NOTE — Discharge Instructions (Signed)

## 2024-02-26 NOTE — Op Note (Signed)
 [facility] Patient Name: Sue Wood Procedure Date: 02/26/2024 10:52 AM MRN: [id number] Date of Birth: 15-Oct-1945 Attending MD: Terril Fetters , MD, [id number] CSN: [id number] Age: 78 Admit Type: Outpatient Procedure:                Colonoscopy Indications:              Screening for colorectal malignant neoplasm Providers:                Terril Fetters, MD, Vonna Guardian, Theola Fitch Referring MD:              Medicines:                Monitored Anesthesia Care Complications:            No immediate complications. Estimated Blood Loss:     Estimated blood loss was minimal. Procedure:                Pre-Anesthesia Assessment:                           - Prior to the procedure, a History and Physical                            was performed, and patient medications and                            allergies were reviewed. The patient's tolerance of                            previous anesthesia was also reviewed. The risks                            and benefits of the procedure and the sedation                            options and risks were discussed with the patient.                            All questions were answered, and informed consent                            was obtained. Prior Anticoagulants: The patient has                            taken no anticoagulant or antiplatelet agents                            except for aspirin . ASA Grade Assessment: II - A                            patient with mild systemic disease. After reviewing                            the risks and benefits, the patient was deemed in  satisfactory condition to undergo the procedure.                           After obtaining informed consent, the colonoscope                            was passed under direct vision. Throughout the                            procedure, the patient's blood pressure, pulse, and                            oxygen saturations were  monitored continuously. The                            [id number]-[id number] ([id number]) scope was introduced through                            the anus and advanced to the the cecum, identified                            by appendiceal orifice and ileocecal valve. The                            colonoscopy was performed without difficulty. The                            patient tolerated the procedure well. The quality                            of the bowel preparation was evaluated using the                            BBPS [facility] Bowel Preparation Scale) with scores                            of: Right Colon = 3, Transverse Colon = 3 and Left                            Colon = 3 (entire mucosa seen well with no residual                            staining, small fragments of stool or opaque                            liquid). The total BBPS score equals 9. The                            ileocecal valve, appendiceal orifice, and rectum                            were photographed. Scope In: 11:09:20 AM Scope Out: 11:35:35 AM Scope Withdrawal Time: 0 hours 21 minutes  18 seconds  Total Procedure Duration: 0 hours 26 minutes 15 seconds  Findings:      The perianal and digital rectal examinations were normal.      Three sessile polyps were found in the descending colon, ascending colon       and cecum. The polyps were 3 to 7 mm in size. These polyps were removed       with a cold snare. Resection and retrieval were complete.      A few diverticula were found in the left colon.      Non-bleeding internal hemorrhoids were found during retroflexion. The       hemorrhoids were small. Impression:               - Three 3 to 7 mm polyps in the descending colon,                            in the ascending colon and in the cecum, removed                            with a cold snare. Resected and retrieved.                           - Diverticulosis in the left colon.                           - Non-bleeding  internal hemorrhoids. Moderate Sedation:      Per Anesthesia Care Recommendation:           - Patient has a contact number available for                            emergencies. The signs and symptoms of potential                            delayed complications were discussed with the                            patient. Return to normal activities tomorrow.                            Written discharge instructions were provided to the                            patient.                           - Resume previous diet.                           - Continue present medications.                           - Await pathology results.                           - Repeat colonoscopy in 3 - 5 years for  surveillance based on pathology results; if                            medically fit                           - Return to primary care physician as previously                            scheduled. Procedure Code(s):        --- Professional ---                           [id number], Colonoscopy, flexible; with removal of                            tumor(s), polyp(s), or other lesion(s) by snare                            technique Diagnosis Code(s):        --- Professional ---                           Z12.11, Encounter for screening for malignant                            neoplasm of colon                           D12.4, Benign neoplasm of descending colon                           D12.2, Benign neoplasm of ascending colon                           D12.0, Benign neoplasm of cecum                           K64.8, Other hemorrhoids                           K57.30, Diverticulosis of large intestine without                            perforation or abscess without bleeding CPT copyright 2022 American Medical Association. All rights reserved. The codes documented in this report are preliminary and upon coder review may  be revised to meet current compliance  requirements. Terril Fetters, MD Terril Fetters, MD 02/26/2024 11:39:49 AM This report has been signed electronically. Number of Addenda: 0

## 2024-02-27 NOTE — Anesthesia Postprocedure Evaluation (Signed)
 Anesthesia Post Note  Patient: Sue Wood  Procedure(s) Performed: COLONOSCOPY  Patient location during evaluation: Phase II Anesthesia Type: General Level of consciousness: awake Pain management: pain level controlled Vital Signs Assessment: post-procedure vital signs reviewed and stable Respiratory status: spontaneous breathing and respiratory function stable Cardiovascular status: blood pressure returned to baseline and stable Postop Assessment: no headache and no apparent nausea or vomiting Anesthetic complications: no Comments: Late entry   No notable events documented.   Last Vitals:  Vitals:   02/26/24 0857 02/26/24 1143  BP: 139/68 124/62  Pulse: 75 69  Resp: 16 16  Temp: 36.6 C 36.6 C  SpO2: 100% 100%    Last Pain:  Vitals:   02/26/24 1143  TempSrc:   PainSc: 0-No pain                 Yvonna JINNY Bosworth

## 2024-02-29 ENCOUNTER — Encounter (HOSPITAL_COMMUNITY): Payer: Self-pay | Admitting: Gastroenterology

## 2024-02-29 LAB — SURGICAL PATHOLOGY

## 2024-03-01 ENCOUNTER — Ambulatory Visit (INDEPENDENT_AMBULATORY_CARE_PROVIDER_SITE_OTHER): Payer: Self-pay | Admitting: Gastroenterology

## 2024-03-01 ENCOUNTER — Ambulatory Visit

## 2024-03-04 ENCOUNTER — Encounter (INDEPENDENT_AMBULATORY_CARE_PROVIDER_SITE_OTHER): Payer: Self-pay | Admitting: *Deleted

## 2024-03-04 NOTE — Progress Notes (Signed)
 5 yr TCS noted in recall Patient result letter mailed Patient's PCP is on EPIC

## 2024-03-17 ENCOUNTER — Ambulatory Visit: Payer: Self-pay

## 2024-03-18 ENCOUNTER — Telehealth: Payer: Self-pay

## 2024-03-18 NOTE — Telephone Encounter (Signed)
 Called pt to inform her that her labs showed very good control of her diabetes.  Her A1C was 6.1.  all other labs were within normal range. Recommend f/u in 6 months or sooner if needed.   CMP14+EGFR; Lipid panel; Mi

## 2024-04-01 ENCOUNTER — Other Ambulatory Visit (HOSPITAL_COMMUNITY): Payer: Self-pay

## 2024-04-01 ENCOUNTER — Other Ambulatory Visit: Payer: Self-pay

## 2024-04-01 DIAGNOSIS — Z1231 Encounter for screening mammogram for malignant neoplasm of breast: Secondary | ICD-10-CM

## 2024-04-01 DIAGNOSIS — Z0100 Encounter for examination of eyes and vision without abnormal findings: Secondary | ICD-10-CM

## 2024-04-01 DIAGNOSIS — Z0000 Encounter for general adult medical examination without abnormal findings: Secondary | ICD-10-CM

## 2024-04-01 MED ORDER — INSULIN DETEMIR 100 UNIT/ML ~~LOC~~ SOLN: 30.0000 [IU] | Freq: Every day | SUBCUTANEOUS | 0 refills | Status: DC

## 2024-04-01 NOTE — Telephone Encounter (Signed)
 Copied from CRM [id number]. Topic: Clinical - Medication Refill >> Apr 01, 2024  4:10 PM Wess S wrote: Medication: insulin  detemir (LEVEMIR ) 100 UNIT/ML injection   Has the patient contacted their pharmacy? No (Agent: If no, request that the patient contact the pharmacy for the refill. If patient does not wish to contact the pharmacy document the reason why and proceed with request.) (Agent: If yes, when and what did the pharmacy advise?)  This is the patient's preferred pharmacy:   [id number] - [place] ([place]),  - [address] [place] ([place])  [address] Phone: [phone] Fax: [phone]  Is this the correct pharmacy for this prescription? Yes If no, delete pharmacy and type the correct one.   Has the prescription been filled recently? Yes  Is the patient out of the medication? No  Has the patient been seen for an appointment in the last year OR does the patient have an upcoming appointment? Yes  Can we respond through MyChart? No  Agent: Please be advised that Rx refills may take up to 3 business days. We ask that you follow-up with your pharmacy.

## 2024-04-04 ENCOUNTER — Telehealth: Payer: Self-pay | Admitting: Pharmacy Technician

## 2024-04-04 ENCOUNTER — Other Ambulatory Visit: Payer: Self-pay

## 2024-04-04 ENCOUNTER — Other Ambulatory Visit (HOSPITAL_COMMUNITY): Payer: Self-pay

## 2024-04-04 DIAGNOSIS — Z794 Long term (current) use of insulin: Secondary | ICD-10-CM

## 2024-04-04 MED ORDER — INSULIN DETEMIR 100 UNIT/ML ~~LOC~~ SOLN
30.0000 [IU] | Freq: Every day | SUBCUTANEOUS | 2 refills | Status: DC
Start: 2024-04-04 — End: 2024-04-12

## 2024-04-04 NOTE — Telephone Encounter (Signed)
 Pharmacy Patient Advocate Encounter   Received notification from CoverMyMeds that prior authorization for Levemir  100UNIT/ML solution is required/requested.   Insurance verification completed.   The patient is insured through [employer] .   Per test claim:  LANTUS , TRESIBA, TOUJEO  is preferred by the insurance.  If suggested medication is appropriate, Please send in a new RX and discontinue this one. If not, please advise as to why it's not appropriate so that we may request a Prior Authorization. Please note, some preferred medications may still require a PA.  If the suggested medications have not been trialed and there are no contraindications to their use, the PA will not be submitted, as it will not be approved.  Levemir  has been discontinued.

## 2024-04-11 NOTE — Telephone Encounter (Signed)
 Can you provide an update on therapy change for this patient?

## 2024-04-12 ENCOUNTER — Other Ambulatory Visit: Payer: Self-pay

## 2024-04-12 DIAGNOSIS — Z794 Long term (current) use of insulin: Secondary | ICD-10-CM

## 2024-04-12 MED ORDER — LANTUS SOLOSTAR 100 UNIT/ML ~~LOC~~ SOPN: 30.0000 [IU] | PEN_INJECTOR | Freq: Every day | SUBCUTANEOUS | 1 refills | Status: DC

## 2024-04-13 ENCOUNTER — Other Ambulatory Visit: Payer: Self-pay

## 2024-04-13 NOTE — Telephone Encounter (Signed)
 Therapy changed to Lantus .

## 2024-05-02 ENCOUNTER — Other Ambulatory Visit: Payer: Self-pay

## 2024-05-02 DIAGNOSIS — Z794 Long term (current) use of insulin: Secondary | ICD-10-CM

## 2024-05-02 MED ORDER — CONTOUR NEXT TEST VI STRP: 1.0000 | ORAL_STRIP | Freq: Three times a day (TID) | 12 refills | Status: AC

## 2024-05-23 ENCOUNTER — Other Ambulatory Visit: Payer: Self-pay | Admitting: Internal Medicine

## 2024-05-23 DIAGNOSIS — E782 Mixed hyperlipidemia: Secondary | ICD-10-CM

## 2024-06-14 ENCOUNTER — Ambulatory Visit: Payer: Medicare Other

## 2024-06-14 VITALS — Ht 67.0 in | Wt 198.0 lb

## 2024-06-14 DIAGNOSIS — Z0000 Encounter for general adult medical examination without abnormal findings: Secondary | ICD-10-CM

## 2024-06-14 DIAGNOSIS — Z780 Asymptomatic menopausal state: Secondary | ICD-10-CM | POA: Diagnosis not present

## 2024-06-14 DIAGNOSIS — Z1231 Encounter for screening mammogram for malignant neoplasm of breast: Secondary | ICD-10-CM

## 2024-06-14 NOTE — Progress Notes (Signed)
 Subjective:   Sue Wood is a 78 y.o. who presents for a Medicare Wellness preventive visit.  As a reminder, Annual Wellness Visits don't include a physical exam, and some assessments may be limited, especially if this visit is performed virtually. We may recommend an in-person follow-up visit with your provider if needed.  Visit Complete: Virtual I connected with  Sue Wood on 06/14/24 by a audio enabled telemedicine application and verified that I am speaking with the correct person using two identifiers.  Patient Location: Home  Provider Location: Home Office  I discussed the limitations of evaluation and management by telemedicine. The patient expressed understanding and agreed to proceed.  Vital Signs: Because this visit was a virtual/telehealth visit, some criteria may be missing or patient reported. Any vitals not documented were not able to be obtained and vitals that have been documented are patient reported.  VideoDeclined- This patient declined Librarian, academic. Therefore the visit was completed with audio only.  Persons Participating in Visit: Patient.  AWV Questionnaire: No: Patient Medicare AWV questionnaire was not completed prior to this visit.  Cardiac Risk Factors include: advanced age (>55men, >89 women);diabetes mellitus;dyslipidemia;hypertension;obesity (BMI >30kg/m2)     Objective:    Today's Vitals   06/14/24 1007  Weight: 198 lb (89.8 kg)  Height: 5' 7 (1.702 m)   Body mass index is 31.01 kg/m.     06/14/2024   10:02 AM 02/26/2024    8:52 AM 01/27/2024    7:52 AM 06/10/2023   11:06 AM 07/04/2017    4:53 PM 12/08/2013    8:35 AM  Advanced Directives  Does Patient Have a Medical Advance Directive? No No No No No  Patient does not have advance directive;Patient would not like information   Would patient like information on creating a medical advance directive? No - Patient declined  No - Patient declined No -  Patient declined No - Patient declined    Pre-existing out of facility DNR order (yellow form or pink MOST form)      No      Data saved with a previous flowsheet row definition    Current Medications (verified) Outpatient Encounter Medications as of 06/14/2024  Medication Sig   Alcohol  Swabs  70 % PADS Use 1-4 times daily prn   amlodipine -olmesartan  (AZOR ) 10-20 MG tablet TAKE 1 TABLET BY MOUTH DAILY   aspirin  81 MG tablet Take 1 tablet (81 mg total) by mouth at bedtime.   empagliflozin  (JARDIANCE ) 25 MG TABS tablet Take 25 mg by mouth daily.   ezetimibe  (ZETIA ) 10 MG tablet TAKE 1 TABLET BY MOUTH DAILY   Glucagon  (GVOKE HYPOPEN  1-PACK) 0.5 MG/0.1ML SOAJ Inject 0.5 mg into the skin daily as needed.   glucose blood (CONTOUR NEXT TEST) test strip 1 each by Other route 3 (three) times daily. Use as instructed   hydrochlorothiazide  (HYDRODIURIL ) 25 MG tablet TAKE 1 TABLET BY MOUTH DAILY   insulin  glargine (LANTUS  SOLOSTAR) 100 UNIT/ML Solostar Pen Inject 30 Units into the skin daily.   MOUNJARO  15 MG/0.5ML Pen INJECT THE CONTENTS OF ONE PEN  SUBCUTANEOUSLY WEEKLY AS  DIRECTED   nortriptyline  (PAMELOR ) 25 MG capsule Take 1 capsule (25 mg total) by mouth at bedtime.   rosuvastatin  (CRESTOR ) 40 MG tablet TAKE 1 TABLET BY MOUTH DAILY   triamcinolone  cream (KENALOG ) 0.1 % Apply 1 application topically 2 (two) times daily.   No facility-administered encounter medications on file as of 06/14/2024.    Allergies (verified) Lisinopril  History: Past Medical History:  Diagnosis Date   Arthritis    Diabetes mellitus    Hyperlipidemia    Hypertension    Peripheral neuropathy    Past Surgical History:  Procedure Laterality Date   ABDOMINAL HYSTERECTOMY     CESAREAN SECTION     X 2   COLONOSCOPY N/A 12/08/2013   Procedure: COLONOSCOPY;  Surgeon: Claudis RAYMOND Rivet, MD;  Location: [facility];  Service: Endoscopy;  Laterality: N/A;  930   COLONOSCOPY N/A 02/26/2024   Procedure: COLONOSCOPY;   Surgeon: Cinderella Deatrice FALCON, MD;  Location: [facility];  Service: Endoscopy;  Laterality: N/A;  10:00am, ok rm 1-2   KNEE ARTHROSCOPY  1998   left   Family History  Problem Relation Age of Onset   Hypertension Mother    Hyperlipidemia Mother    Hypertension Father    Hyperlipidemia Father    Diabetes Father    Hypertension Sister    Lung disease Sister    Heart disease Sister    Stroke Sister    Emphysema Brother    Diabetes Brother    Heart disease Brother    Colon cancer Neg Hx    Breast cancer Neg Hx    Social History   Socioeconomic History   Marital status: Single    Spouse name: Not on file   Number of children: 2   Years of education: Not on file   Highest education level: Not on file  Occupational History   Not on file  Tobacco Use   Smoking status: Former    Current packs/day: 0.25    Average packs/day: 0.3 packs/day for 18.0 years (4.5 ttl pk-yrs)    Types: Cigarettes   Smokeless tobacco: Never   Tobacco comments:    quit 1980's  Vaping Use   Vaping status: Never Used  Substance and Sexual Activity   Alcohol  use: No   Drug use: No   Sexual activity: Not Currently  Other Topics Concern   Not on file  Social History Narrative   Lives with her daughter    Social Drivers of Health   Financial Resource Strain: Low Risk  (06/14/2024)   Overall Financial Resource Strain (CARDIA)    Difficulty of Paying Living Expenses: Not hard at all  Food Insecurity: No Food Insecurity (06/14/2024)   Hunger Vital Sign    Worried About Running Out of Food in the Last Year: Never true    Ran Out of Food in the Last Year: Never true  Transportation Needs: No Transportation Needs (06/14/2024)   PRAPARE - Administrator, Civil Service (Medical): No    Lack of Transportation (Non-Medical): No  Physical Activity: Sufficiently Active (06/14/2024)   Exercise Vital Sign    Days of Exercise per Week: 7 days    Minutes of Exercise per Session: 30 min  Stress: No  Stress Concern Present (06/14/2024)   [employer] of Occupational Health - Occupational Stress Questionnaire    Feeling of Stress: Not at all  Social Connections: Socially Isolated (06/14/2024)   Social Connection and Isolation Panel    Frequency of Communication with Friends and Family: Twice a week    Frequency of Social Gatherings with Friends and Family: Never    Attends Religious Services: Never    Database administrator or Organizations: No    Attends Banker Meetings: Never    Marital Status: Never married    Tobacco Counseling Counseling given: Yes Tobacco comments: quit  1980's    Clinical Intake:  Pre-visit preparation completed: Yes  Pain : No/denies pain     BMI - recorded: 31.01 Nutritional Status: BMI > 30  Obese Nutritional Risks: None Diabetes: Yes CBG done?: No (telehealth visit.) Did pt. bring in CBG monitor from home?: No  Lab Results  Component Value Date   HGBA1C 6.1 (H) 02/17/2024   HGBA1C 6.3 (H) 10/02/2023   HGBA1C 7.3 (H) 04/01/2023     How often do you need to have someone help you when you read instructions, pamphlets, or other written materials from your doctor or pharmacy?: 1 - Never  Interpreter Needed?: No  Information entered by :: Marliss Buttacavoli W CMA (AAMA)   Activities of Daily Living     06/14/2024   10:14 AM  In your present state of health, do you have any difficulty performing the following activities:  Hearing? 0  Vision? 0  Difficulty concentrating or making decisions? 0  Walking or climbing stairs? 0  Dressing or bathing? 0  Doing errands, shopping? 0  Preparing Food and eating ? N  Using the Toilet? N  In the past six months, have you accidently leaked urine? N  Do you have problems with loss of bowel control? N  Managing your Medications? N  Managing your Finances? N  Housekeeping or managing your Housekeeping? N    Patient Care Team: Bevely Doffing, FNP as PCP - General (Family Medicine) Nicholaus Sherlean CROME, [facility] (Inactive) as Pharmacist (Pharmacist) Pa, [facility] as Referring Physician (Optometry) Patty, A. Robynn, MD (Ophthalmology) Cinderella, Deatrice FALCON, MD as Consulting Physician (Gastroenterology)  I have updated your Care Teams any recent Medical Services you may have received from other providers in the past year.     Assessment:   This is a routine wellness examination for Sue Wood.  Hearing/Vision screen Hearing Screening - Comments:: Patient denies any hearing difficulties.   Vision Screening - Comments:: Wears rx glasses - up to date with routine eye exams with  [facility]   Goals Addressed               This Visit's Progress     Remain active and healthy (pt-stated)        Update 06/14/2024         Depression Screen     06/14/2024   10:15 AM 02/17/2024   10:45 AM 10/02/2023    1:32 PM 07/02/2023    1:34 PM 06/10/2023   11:07 AM 04/01/2023    1:28 PM 12/31/2022    1:12 PM  PHQ 2/9 Scores  PHQ - 2 Score 0 0 0 0 0 0 0  PHQ- 9 Score 0 0   0 0 0     Fall Risk     06/14/2024   10:11 AM 02/17/2024   10:45 AM 10/02/2023    1:31 PM 07/02/2023    1:34 PM 06/10/2023   11:11 AM  Fall Risk   Falls in the past year? 0 0 0 0 0  Number falls in past yr: 0  0  0  Injury with Fall? 0  0  0  Risk for fall due to : No Fall Risks No Fall Risks   No Fall Risks  Follow up Falls evaluation completed;Education provided;Falls prevention discussed Falls evaluation completed   Falls prevention discussed    MEDICARE RISK AT HOME:  Medicare Risk at Home Any stairs in or around the home?: Yes If so, are there  any without handrails?: Yes Home free of loose throw rugs in walkways, pet beds, electrical cords, etc?: Yes Adequate lighting in your home to reduce risk of falls?: Yes Life alert?: No Use of a cane, walker or w/c?: No Grab bars in the bathroom?: No Shower chair or bench in shower?: No Elevated toilet seat or a handicapped  toilet?: No  TIMED UP AND GO:  Was the test performed?  No  Cognitive Function: 6CIT completed        06/14/2024   10:14 AM 06/10/2023   11:07 AM 05/28/2022   12:04 PM  6CIT Screen  What Year? 0 points 0 points 0 points  What month? 0 points 0 points 0 points  What time? 0 points 0 points 0 points  Count back from 20 0 points 0 points 0 points  Months in reverse 0 points 0 points 0 points  Repeat phrase 0 points 0 points 4 points  Total Score 0 points 0 points 4 points    Immunizations Immunization History  Administered Date(s) Administered   Fluad Quad(high Dose 65+) 09/12/2019, 08/15/2020, 05/15/2022   Fluad Trivalent(High Dose 65+) 07/02/2023   INFLUENZA, HIGH DOSE SEASONAL PF 06/30/2017   Influenza,inj,Quad PF,6+ Mos 06/24/2013, 05/23/2014, 05/09/2015, 08/13/2016, 05/11/2018   Influenza-Unspecified 09/12/2019   Moderna SARS-COV2 Booster Vaccination 02/12/2021   Moderna Sars-Covid-2 Vaccination 11/19/2019, 12/21/2019, 08/15/2020   Pneumococcal Conjugate-13 02/06/2015   Pneumococcal Polysaccharide-23 04/22/2016   Tdap 08/31/2013    Screening Tests Health Maintenance  Topic Date Due   Zoster Vaccines- Shingrix (1 of 2) Never done   OPHTHALMOLOGY EXAM  08/28/2021   DEXA SCAN  11/21/2021   Mammogram  04/22/2023   DTaP/Tdap/Td (2 - Td or Tdap) 09/01/2023   Influenza Vaccine  04/08/2024   FOOT EXAM  07/01/2024   HEMOGLOBIN A1C  08/18/2024   Diabetic kidney evaluation - eGFR measurement  02/16/2025   Diabetic kidney evaluation - Urine ACR  02/16/2025   Medicare Annual Wellness (AWV)  06/14/2025   Colonoscopy  02/25/2029   Pneumococcal Vaccine: 50+ Years  Completed   Hepatitis C Screening  Completed   Meningococcal B Vaccine  Aged Out   COVID-19 Vaccine  Discontinued    Health Maintenance Health Maintenance Due  Topic Date Due   Zoster Vaccines- Shingrix (1 of 2) Never done   OPHTHALMOLOGY EXAM  08/28/2021   DEXA SCAN  11/21/2021   Mammogram  04/22/2023    DTaP/Tdap/Td (2 - Td or Tdap) 09/01/2023   Influenza Vaccine  04/08/2024   Health Maintenance Items Addressed: Mammogram ordered, DEXA ordered  Last diabetic eye exam requested from [facility]/ Dr. Robynn Mill in [place]  Additional Screening:  Vision Screening: Recommended annual ophthalmology exams for early detection of glaucoma and other disorders of the eye. Would you like a referral to an eye doctor? No    Dental Screening: Recommended annual dental exams for proper oral hygiene  Community Resource Referral / Chronic Care Management: CRR required this visit?  No   CCM required this visit?  No   Plan:    I have personally reviewed and noted the following in the patient's chart:   Medical and social history Use of alcohol , tobacco or illicit drugs  Current medications and supplements including opioid prescriptions. Patient is not currently taking opioid prescriptions. Functional ability and status Nutritional status Physical activity Advanced directives List of other physicians Hospitalizations, surgeries, and ER visits in previous 12 months Vitals Screenings to include cognitive, depression, and falls Referrals and appointments  In addition, I have reviewed and discussed with patient certain preventive protocols, quality metrics, and best practice recommendations. A written personalized care plan for preventive services as well as general preventive health recommendations were provided to patient.   Elliotte Marsalis, CMA   06/14/2024   After Visit Summary: (Mail) Due to this being a telephonic visit, the after visit summary with patients personalized plan was offered to patient via mail   Notes: Nothing significant to report at this time.

## 2024-06-14 NOTE — Patient Instructions (Signed)
 [name],  Thank you for taking the time for your Medicare Wellness Visit. I appreciate your continued commitment to your health goals. Please review the care plan we discussed, and feel free to reach out if I can assist you further.  Medicare recommends these wellness visits once per year to help you and your care team stay ahead of potential health issues. These visits are designed to focus on prevention, allowing your provider to concentrate on managing your acute and chronic conditions during your regular appointments.  Please note that Annual Wellness Visits do not include a physical exam. Some assessments may be limited, especially if the visit was conducted virtually. If needed, we may recommend a separate in-person follow-up with your provider.  Ongoing Care  Seeing your primary care provider every 3 to 6 months helps us  monitor your health and provide consistent, personalized care.   Referrals   Mammogram/Bone Density Screening: Call [facility] @ Phone: [phone]   Recommended Screenings:  Health Maintenance  Topic Date Due   Zoster (Shingles) Vaccine (1 of 2) Never done   Eye exam for diabetics  08/28/2021   DEXA scan (bone density measurement)  11/21/2021   Breast Cancer Screening  04/22/2023   DTaP/Tdap/Td vaccine (2 - Td or Tdap) 09/01/2023   Flu Shot  04/08/2024   Complete foot exam   07/01/2024   Hemoglobin A1C  08/18/2024   Yearly kidney function blood test for diabetes  02/16/2025   Yearly kidney health urinalysis for diabetes  02/16/2025   Medicare Annual Wellness Visit  06/14/2025   Colon Cancer Screening  02/25/2029   Pneumococcal Vaccine for age over 8  Completed   Hepatitis C Screening  Completed   Meningitis B Vaccine  Aged Out   COVID-19 Vaccine  Discontinued       06/14/2024   10:02 AM  Advanced Directives  Does Patient Have a Medical Advance Directive? No  Would patient like information on creating a medical advance directive?  No - Patient declined    Advance Care Planning is important because it: Ensures you receive medical care that aligns with your values, goals, and preferences. Provides guidance to your family and loved ones, reducing the emotional burden of decision-making during critical moments.  Vision: Annual vision screenings are recommended for early detection of glaucoma, cataracts, and diabetic retinopathy. These exams can also reveal signs of chronic conditions such as diabetes and high blood pressure.  Dental: Annual dental screenings help detect early signs of oral cancer, gum disease, and other conditions linked to overall health, including heart disease and diabetes.  Please see the attached documents for additional preventive care recommendations.

## 2024-06-20 ENCOUNTER — Ambulatory Visit

## 2024-06-20 VITALS — BP 107/73 | HR 77 | Ht 67.0 in | Wt 190.0 lb

## 2024-06-20 DIAGNOSIS — Z23 Encounter for immunization: Secondary | ICD-10-CM | POA: Diagnosis not present

## 2024-06-20 DIAGNOSIS — E663 Overweight: Secondary | ICD-10-CM

## 2024-06-20 DIAGNOSIS — Z794 Long term (current) use of insulin: Secondary | ICD-10-CM

## 2024-06-20 DIAGNOSIS — I10 Essential (primary) hypertension: Secondary | ICD-10-CM | POA: Diagnosis not present

## 2024-06-20 DIAGNOSIS — E1143 Type 2 diabetes mellitus with diabetic autonomic (poly)neuropathy: Secondary | ICD-10-CM | POA: Diagnosis not present

## 2024-06-20 MED ORDER — LANTUS SOLOSTAR 100 UNIT/ML ~~LOC~~ SOPN: 30.0000 [IU] | PEN_INJECTOR | Freq: Every day | SUBCUTANEOUS | 1 refills | Status: AC

## 2024-06-20 NOTE — Progress Notes (Signed)
 Established Patient Office Visit  Subjective   Patient ID: Sue Wood, female    DOB: 1946-01-19  Age: 78 y.o. MRN: [id number]  Chief Complaint  Patient presents with   Medical Management of Chronic Issues    4 month follow up    HPI Discussed the use of AI scribe software for clinical note transcription with the patient, who gave verbal consent to proceed.  History of Present Illness   Sue Wood is a 78 year old female who presents for a routine follow-up visit.  Diabetes mellitus management - Continues weekly Mounjaro  injections for glycemic control - No issues with constipation - Last hemoglobin A1c was the lowest in years - Requests refill for Lantus , which was sent to [facility]  Hypertension management - Takes hydrochlorothiazide  daily for blood pressure control - Medication has not been filled recently, but confirms daily use  Preventive health maintenance - Received influenza vaccination today  Constitutional symptoms - No current issues or concerns      Patient Active Problem List   Diagnosis Date Noted   Overweight with body mass index (BMI) 25.0-29.9 06/20/2024   Adenomatous polyp of ascending colon 02/26/2024   Colon cancer screening 10/02/2023   Vitamin D  insufficiency 07/02/2023   Chronic kidney disease, stage 3a (HCC) 07/02/2023   Need for immunization against influenza 05/15/2022   Annual physical exam 04/04/2022   Osteoporosis 06/30/2017   Osteoarthritis, knee 12/16/2012   Diabetes mellitus with neurological manifestation (HCC) 12/31/2011   Essential hypertension, benign 12/31/2011   Hyperlipidemia 12/31/2011   Bilateral bunions 12/31/2011   Diabetic neuropathy (HCC) 12/31/2011   Class 2 severe obesity due to excess calories with serious comorbidity and body mass index (BMI) of 35.0 to 35.9 in adult       ROS    Objective:     BP 107/73   Pulse 77   Ht 5' 7 (1.702 m)   Wt 190 lb 0.6 oz (86.2 kg)   SpO2 96%   BMI 29.76  kg/m  BP Readings from Last 3 Encounters:  06/20/24 107/73  02/26/24 124/62  02/17/24 134/81   Wt Readings from Last 3 Encounters:  06/20/24 190 lb 0.6 oz (86.2 kg)  06/14/24 198 lb (89.8 kg)  02/26/24 198 lb (89.8 kg)     Physical Exam Vitals and nursing note reviewed.  Constitutional:      Appearance: Normal appearance.  HENT:     Head: Normocephalic.  Eyes:     Extraocular Movements: Extraocular movements intact.     Pupils: Pupils are equal, round, and reactive to light.  Cardiovascular:     Rate and Rhythm: Normal rate and regular rhythm.  Pulmonary:     Effort: Pulmonary effort is normal.     Breath sounds: Normal breath sounds.  Musculoskeletal:     Cervical back: Normal range of motion and neck supple.  Neurological:     Mental Status: She is alert and oriented to person, place, and time.  Psychiatric:        Mood and Affect: Mood normal.        Thought Content: Thought content normal.        The 10-year ASCVD risk score (Arnett DK, et al., 2019) is: 21.6%    Assessment & Plan:   Problem List Items Addressed This Visit       Cardiovascular and Mediastinum   Essential hypertension, benign   Remains adequately controlled on current antihypertensive regimen.  No medication changes are indicated  today.        Endocrine   Diabetes mellitus with neurological manifestation (HCC) - Primary   Well-controlled with recent A1c at its lowest in years. - Continue Mounjaro  once weekly. - Refill Lantus  prescription through [facility]. - Order labs today.      Relevant Medications   insulin  glargine (LANTUS  SOLOSTAR) 100 UNIT/ML Solostar Pen   Other Relevant Orders   Basic Metabolic Panel (BMET) (Completed)   HgB A1c (Completed)     Other   Overweight with body mass index (BMI) 25.0-29.9   Continue with weight loss efforts including a healthy diet and regular exercise.        Other Visit Diagnoses       Immunization due       Relevant Orders   Flu  vaccine HIGH DOSE PF(Fluzone Trivalent) (Completed)           Return in about 6 months (around 12/19/2024) for chronic follow-up with PCP.    Leita Longs, FNP

## 2024-06-21 LAB — HEMOGLOBIN A1C
Est. average glucose Bld gHb Est-mCnc: 126 mg/dL
Hgb A1c MFr Bld: 6 % — ABNORMAL HIGH (ref 4.8–5.6)

## 2024-06-21 LAB — BASIC METABOLIC PANEL WITH GFR
BUN/Creatinine Ratio: 17 (ref 12–28)
BUN: 16 mg/dL (ref 8–27)
CO2: 24 mmol/L (ref 20–29)
Calcium: 9.5 mg/dL (ref 8.7–10.3)
Chloride: 103 mmol/L (ref 96–106)
Creatinine, Ser: 0.96 mg/dL (ref 0.57–1.00)
Glucose: 84 mg/dL (ref 70–99)
Potassium: 4.5 mmol/L (ref 3.5–5.2)
Sodium: 139 mmol/L (ref 134–144)
eGFR: 61 mL/min/1.73 (ref >59)

## 2024-06-25 ENCOUNTER — Ambulatory Visit: Payer: Self-pay

## 2024-06-25 NOTE — Assessment & Plan Note (Signed)
 Well-controlled with recent A1c at its lowest in years. - Continue Mounjaro  once weekly. - Refill Lantus  prescription through [facility]. - Order labs today.

## 2024-06-25 NOTE — Assessment & Plan Note (Signed)
 Remains adequately controlled on current antihypertensive regimen.  No medication changes are indicated today.

## 2024-06-25 NOTE — Assessment & Plan Note (Signed)
 Continue with weight loss efforts including a healthy diet and regular exercise.

## 2024-06-27 ENCOUNTER — Other Ambulatory Visit: Payer: Self-pay | Admitting: Internal Medicine

## 2024-06-27 ENCOUNTER — Telehealth: Payer: Self-pay

## 2024-06-27 DIAGNOSIS — I10 Essential (primary) hypertension: Secondary | ICD-10-CM

## 2024-06-27 DIAGNOSIS — Z794 Long term (current) use of insulin: Secondary | ICD-10-CM

## 2024-06-27 NOTE — Telephone Encounter (Signed)
 Called pt to let her know that Her labs show good control of her diabetes. Her A1C was 6.0. Continue with current medications and recommend rechecking labs in 6 months.

## 2024-08-30 ENCOUNTER — Encounter

## 2024-09-20 ENCOUNTER — Encounter

## 2024-11-15 ENCOUNTER — Encounter

## 2024-12-19 ENCOUNTER — Ambulatory Visit

## 2025-06-19 ENCOUNTER — Ambulatory Visit
